# Patient Record
Sex: Female | Born: 1940 | Race: Black or African American | Hispanic: No | Smoking: Never smoker
Health system: Southern US, Community
[De-identification: ages and names within clinical notes are randomized; demographics above are authoritative.]

## PROBLEM LIST (undated history)

## (undated) DIAGNOSIS — E785 Hyperlipidemia, unspecified: Secondary | ICD-10-CM

## (undated) DIAGNOSIS — I10 Essential (primary) hypertension: Secondary | ICD-10-CM

## (undated) DIAGNOSIS — T7840XA Allergy, unspecified, initial encounter: Secondary | ICD-10-CM

## (undated) DIAGNOSIS — G4700 Insomnia, unspecified: Secondary | ICD-10-CM

## (undated) DIAGNOSIS — F0390 Unspecified dementia without behavioral disturbance: Secondary | ICD-10-CM

## (undated) HISTORY — DX: Essential (primary) hypertension: I10

## (undated) HISTORY — DX: Insomnia, unspecified: G47.00

## (undated) HISTORY — DX: Allergy, unspecified, initial encounter: T78.40XA

## (undated) HISTORY — DX: Hyperlipidemia, unspecified: E78.5

---

## 2014-10-22 DIAGNOSIS — G4700 Insomnia, unspecified: Secondary | ICD-10-CM | POA: Diagnosis not present

## 2014-10-22 DIAGNOSIS — I10 Essential (primary) hypertension: Secondary | ICD-10-CM | POA: Diagnosis not present

## 2014-10-22 DIAGNOSIS — I129 Hypertensive chronic kidney disease with stage 1 through stage 4 chronic kidney disease, or unspecified chronic kidney disease: Secondary | ICD-10-CM | POA: Diagnosis not present

## 2014-10-31 DIAGNOSIS — L811 Chloasma: Secondary | ICD-10-CM | POA: Diagnosis not present

## 2015-02-04 DIAGNOSIS — G4700 Insomnia, unspecified: Secondary | ICD-10-CM | POA: Diagnosis not present

## 2015-02-04 DIAGNOSIS — F0390 Unspecified dementia without behavioral disturbance: Secondary | ICD-10-CM | POA: Diagnosis not present

## 2015-04-09 ENCOUNTER — Telehealth: Payer: Self-pay

## 2015-04-09 DIAGNOSIS — G4700 Insomnia, unspecified: Secondary | ICD-10-CM | POA: Insufficient documentation

## 2015-04-09 DIAGNOSIS — I10 Essential (primary) hypertension: Secondary | ICD-10-CM | POA: Insufficient documentation

## 2015-04-09 DIAGNOSIS — E785 Hyperlipidemia, unspecified: Secondary | ICD-10-CM | POA: Insufficient documentation

## 2015-04-09 NOTE — Telephone Encounter (Signed)
I will not rx ambien  If pt willing neurology apt to check for insomnia and explore suitable tx

## 2015-04-09 NOTE — Telephone Encounter (Signed)
Patient does not want Neurology referral.

## 2015-04-09 NOTE — Telephone Encounter (Signed)
Patient states the medication you are giving her is not helping her sleep and she would like to get her Ambien back [facility]

## 2015-05-29 ENCOUNTER — Encounter: Payer: Self-pay | Admitting: Family Medicine

## 2015-05-29 ENCOUNTER — Ambulatory Visit (INDEPENDENT_AMBULATORY_CARE_PROVIDER_SITE_OTHER): Payer: Commercial Managed Care - HMO | Admitting: Family Medicine

## 2015-05-29 VITALS — BP 131/70 | HR 63 | Temp 97.9°F | Ht 64.3 in | Wt 150.6 lb

## 2015-05-29 DIAGNOSIS — G4700 Insomnia, unspecified: Secondary | ICD-10-CM | POA: Diagnosis not present

## 2015-05-29 MED ORDER — MIRTAZAPINE 7.5 MG PO TABS: 7.5000 mg | ORAL_TABLET | Freq: Every day | ORAL | Status: DC

## 2015-05-29 NOTE — Patient Instructions (Signed)
Insomnia Insomnia is frequent trouble falling and/or staying asleep. Insomnia can be a long term problem or a short term problem. Both are common. Insomnia can be a short term problem when the wakefulness is related to a certain stress or worry. Long term insomnia is often related to ongoing stress during waking hours and/or poor sleeping habits. Overtime, sleep deprivation itself can make the problem worse. Every little thing feels more severe because you are overtired and your ability to cope is decreased. CAUSES   Stress, anxiety, and depression.  Poor sleeping habits.  Distractions such as TV in the bedroom.  Naps close to bedtime.  Engaging in emotionally charged conversations before bed.  Technical reading before sleep.  Alcohol and other sedatives. They may make the problem worse. They can hurt normal sleep patterns and normal dream activity.  Stimulants such as caffeine for several hours prior to bedtime.  Pain syndromes and shortness of breath can cause insomnia.  Exercise late at night.  Changing time zones may cause sleeping problems (jet lag). It is sometimes helpful to have someone observe your sleeping patterns. They should look for periods of not breathing during the night (sleep apnea). They should also look to see how long those periods last. If you live alone or observers are uncertain, you can also be observed at a sleep clinic where your sleep patterns will be professionally monitored. Sleep apnea requires a checkup and treatment. Give your caregivers your medical history. Give your caregivers observations your family has made about your sleep.  SYMPTOMS   Not feeling rested in the morning.  Anxiety and restlessness at bedtime.  Difficulty falling and staying asleep. TREATMENT   Your caregiver may prescribe treatment for an underlying medical disorders. Your caregiver can give advice or help if you are using alcohol or other drugs for self-medication. Treatment  of underlying problems will usually eliminate insomnia problems.  Medications can be prescribed for short time use. They are generally not recommended for lengthy use.  Over-the-counter sleep medicines are not recommended for lengthy use. They can be habit forming.  You can promote easier sleeping by making lifestyle changes such as:  Using relaxation techniques that help with breathing and reduce muscle tension.  Exercising earlier in the day.  Changing your diet and the time of your last meal. No night time snacks.  Establish a regular time to go to bed.  Counseling can help with stressful problems and worry.  Soothing music and white noise may be helpful if there are background noises you cannot remove.  Stop tedious detailed work at least one hour before bedtime. HOME CARE INSTRUCTIONS   Keep a diary. Inform your caregiver about your progress. This includes any medication side effects. See your caregiver regularly. Take note of:  Times when you are asleep.  Times when you are awake during the night.  The quality of your sleep.  How you feel the next day. This information will help your caregiver care for you.  Get out of bed if you are still awake after 15 minutes. Read or do some quiet activity. Keep the lights down. Wait until you feel sleepy and go back to bed.  Keep regular sleeping and waking hours. Avoid naps.  Exercise regularly.  Avoid distractions at bedtime. Distractions include watching television or engaging in any intense or detailed activity like attempting to balance the household checkbook.  Develop a bedtime ritual. Keep a familiar routine of bathing, brushing your teeth, climbing into bed at the same   time each night, listening to soothing music. Routines increase the success of falling to sleep faster.  Use relaxation techniques. This can be using breathing and muscle tension release routines. It can also include visualizing peaceful scenes. You can  also help control troubling or intruding thoughts by keeping your mind occupied with boring or repetitive thoughts like the old concept of counting sheep. You can make it more creative like imagining planting one beautiful flower after another in your backyard garden.  During your day, work to eliminate stress. When this is not possible use some of the previous suggestions to help reduce the anxiety that accompanies stressful situations. MAKE SURE YOU:   Understand these instructions.  Will watch your condition.  Will get help right away if you are not doing well or get worse. Document Released: 10/01/2000 Document Revised: 12/27/2011 Document Reviewed: 11/01/2007 Exit[employer] Patient Information 2015 [employer]. This information is not intended to replace advice given to you by your health care provider. Make sure you discuss any questions you have with your health care provider.  

## 2015-05-29 NOTE — Progress Notes (Signed)
BP 131/70 mmHg  Pulse 63  Temp(Src) 97.9 F (36.6 C)  Ht 5' 4.3" (1.633 m)  Wt 150 lb 9.6 oz (68.312 kg)  BMI 25.62 kg/m2  SpO2 98%  LMP 05/20/1994 (Approximate)   Subjective:    Patient ID: Tara Huffman, female    DOB: 03-30-1941, 74 y.o.   MRN: [id number]  HPI: Tara Huffman is a 74 y.o. female  Chief Complaint  Patient presents with  . Insomnia    Patient states that she takes the aricept at times, she states that it causes her to see things. She believes that it is too strong   Reviewed records from [facility]- numerous conversations about adverse effects of ambien. Sleepwalking, tearing things up. Has been having visual hallucinations at night both on and off medications (trazodone). Started on Aricept at night in April. Offered referral to neurology, which she refused in June. Never picked up the remeron from the pharmacy, so never tried it. Has currently just been taking her blood pressure medicine and her aricept, which she takes only occasionally.  INSOMNIA Duration: chronic Satisfied with sleep quality: no Difficulty falling asleep: yes Difficulty staying asleep: yes Waking a few hours after sleep onset: yes Early morning awakenings: yes Daytime hypersomnolence: yes Wakes feeling refreshed: no Good sleep hygiene: no Apnea: no Snoring: no Depressed/anxious mood: no Recent stress: no Restless legs/nocturnal leg cramps: no Chronic pain/arthritis: no History of sleep study: no  Relevant past medical, surgical, family and social history reviewed and updated as indicated. Interim medical history since our last visit reviewed. Allergies and medications reviewed and updated.  Review of Systems  Constitutional: Negative.   Cardiovascular: Negative.   Psychiatric/Behavioral: Negative.     Per HPI unless specifically indicated above     Objective:    BP 131/70 mmHg  Pulse 63  Temp(Src) 97.9 F (36.6 C)  Ht 5' 4.3" (1.633 m)  Wt 150 lb 9.6 oz (68.312 kg)  BMI  25.62 kg/m2  SpO2 98%  LMP 05/20/1994 (Approximate)  Wt Readings from Last 3 Encounters:  05/29/15 150 lb 9.6 oz (68.312 kg)  02/04/15 152 lb (68.947 kg)    Physical Exam  Constitutional: She is oriented to person, place, and time. She appears well-developed and well-nourished. No distress.  HENT:  Head: Normocephalic and atraumatic.  Right Ear: Hearing normal.  Left Ear: Hearing normal.  Nose: Nose normal.  Eyes: Conjunctivae and lids are normal. Right eye exhibits no discharge. Left eye exhibits no discharge. No scleral icterus.  Cardiovascular: Normal rate, regular rhythm and normal heart sounds.  Exam reveals no gallop and no friction rub.   No murmur heard. Pulmonary/Chest: Effort normal and breath sounds normal. No respiratory distress. She has no wheezes. She has no rales. She exhibits no tenderness.  Musculoskeletal: Normal range of motion.  Neurological: She is alert and oriented to person, place, and time.  Skin: Skin is warm, dry and intact. No rash noted. No erythema. No pallor.  Psychiatric: She has a normal mood and affect. Her speech is normal and behavior is normal. Judgment and thought content normal. Cognition and memory are normal.  Nursing note and vitals reviewed.   No results found for this or any previous visit.    Assessment & Plan:   Problem List Items Addressed This Visit      Other   Insomnia - Primary    Informed patient that we cannot give her any more ambien and that it is not a good medicine for her.  She did not like trazodone, so it was discontinued. Never started the remeron. Will start her on the 7.5mg  of remeron, may need to increase. Follow up 1 month. Patient agreed today that if this medicine doesn't seem to work for her, she would go see neurology, but she doesn't want to go today.           Follow up plan: Return in about 4 weeks (around 06/26/2015).

## 2015-05-29 NOTE — Assessment & Plan Note (Signed)
Informed patient that we cannot give her any more ambien and that it is not a good medicine for her. She did not like trazodone, so it was discontinued. Never started the remeron. Will start her on the 7.5mg  of remeron, may need to increase. Follow up 1 month. Patient agreed today that if this medicine doesn't seem to work for her, she would go see neurology, but she doesn't want to go today.

## 2015-06-24 ENCOUNTER — Telehealth: Payer: Self-pay | Admitting: Family Medicine

## 2015-06-24 NOTE — Telephone Encounter (Signed)
PT WAS GIVEN REMERON FOR SLEEP BUT STATES THAT ITS NOT WORKING AND WOULD LIKE TO KNOW IF SHE COULD POSSIBLY GET SOMETHING ELSE.

## 2015-06-24 NOTE — Telephone Encounter (Signed)
Forward to provider

## 2015-06-24 NOTE — Telephone Encounter (Signed)
Will need to wait till MAC gets back

## 2015-06-24 NOTE — Telephone Encounter (Signed)
Patient notified

## 2015-06-25 ENCOUNTER — Other Ambulatory Visit: Payer: Self-pay | Admitting: Family Medicine

## 2015-07-29 ENCOUNTER — Ambulatory Visit (INDEPENDENT_AMBULATORY_CARE_PROVIDER_SITE_OTHER): Payer: Commercial Managed Care - HMO | Admitting: Family Medicine

## 2015-07-29 ENCOUNTER — Encounter: Payer: Self-pay | Admitting: Family Medicine

## 2015-07-29 VITALS — BP 138/60 | HR 65 | Temp 98.8°F | Ht 64.1 in | Wt 152.0 lb

## 2015-07-29 DIAGNOSIS — I10 Essential (primary) hypertension: Secondary | ICD-10-CM

## 2015-07-29 DIAGNOSIS — R413 Other amnesia: Secondary | ICD-10-CM | POA: Diagnosis not present

## 2015-07-29 DIAGNOSIS — G4700 Insomnia, unspecified: Secondary | ICD-10-CM

## 2015-07-29 DIAGNOSIS — R443 Hallucinations, unspecified: Secondary | ICD-10-CM | POA: Diagnosis not present

## 2015-07-29 MED ORDER — QUETIAPINE FUMARATE 25 MG PO TABS: 25.0000 mg | ORAL_TABLET | Freq: Every day | ORAL | Status: DC

## 2015-07-29 NOTE — Assessment & Plan Note (Signed)
Better on recheck. Continue to monitor.  

## 2015-07-29 NOTE — Assessment & Plan Note (Signed)
Possible due to dementia. Referral to neurology made today. Will treat with seroquel to try to decrease hallucinations and help her sleep.

## 2015-07-29 NOTE — Progress Notes (Signed)
BP 138/60 mmHg  Pulse 65  Temp(Src) 98.8 F (37.1 C)  Ht 5' 4.1" (1.628 m)  Wt 152 lb (68.947 kg)  BMI 26.01 kg/m2  SpO2 97%  LMP 05/20/1994 (Approximate)   Subjective:    Patient ID: Tara Huffman, female    DOB: [birth year], 74 y.o.   MRN: [id number]  HPI: Tara Huffman is a 74 y.o. female  Chief Complaint  Patient presents with  . Memory Loss    MMSE- 19.5   Here today with her daughter who notes that she has been very anxious and shaking. She has not been sleeping. The remeron seems to be helping with sleep occasionally. She notes that she has been hallucinating more. She has been hallucinating more and has been calling the police. She thinks that she is seeing people in her house, thought she saw her neighbor in her laundry room and also thought she saw her son beat up in her laundry room. She will sometimes go several days without sleeping- per her and her daughter. She notes that she has been incredbily anxious and has been shaking and difficult to console. She previously did not want to see the neurologist. She doesn't like taking her aricept because she thinks it makes her hallucinate.   INSOMNIA Duration: chronic Satisfied with sleep quality: no Difficulty falling asleep: yes Difficulty staying asleep: yes Waking a few hours after sleep onset: yes Early morning awakenings: yes Daytime hypersomnolence: no Wakes feeling refreshed: no Good sleep hygiene: no Apnea: no Snoring: no Depressed/anxious mood: yes Recent stress: yes Restless legs/nocturnal leg cramps: no Chronic pain/arthritis: no History of sleep study: no Treatments attempted: melatonin, uinsom, benadryl and ambien, trazodone, remeron    Relevant past medical, surgical, family and social history reviewed and updated as indicated. Interim medical history since our last visit reviewed. Allergies and medications reviewed and updated.  Review of Systems  Constitutional: Negative.   Respiratory:  Negative.   Cardiovascular: Negative.   Musculoskeletal: Negative.   Neurological: Negative for dizziness, tremors, seizures, syncope, facial asymmetry, speech difficulty, weakness, light-headedness, numbness and headaches.  Psychiatric/Behavioral: Positive for hallucinations, confusion, sleep disturbance, decreased concentration and agitation. Negative for suicidal ideas, behavioral problems, self-injury and dysphoric mood. The patient is nervous/anxious. The patient is not hyperactive.     Per HPI unless specifically indicated above     Objective:    BP 138/60 mmHg  Pulse 65  Temp(Src) 98.8 F (37.1 C)  Ht 5' 4.1" (1.628 m)  Wt 152 lb (68.947 kg)  BMI 26.01 kg/m2  SpO2 97%  LMP 05/20/1994 (Approximate)  Wt Readings from Last 3 Encounters:  07/29/15 152 lb (68.947 kg)  05/29/15 150 lb 9.6 oz (68.312 kg)  02/04/15 152 lb (68.947 kg)    Physical Exam  Constitutional: She is oriented to person, place, and time. She appears well-developed and well-nourished. No distress.  HENT:  Head: Normocephalic and atraumatic.  Right Ear: Hearing normal.  Left Ear: Hearing normal.  Nose: Nose normal.  Eyes: Conjunctivae and lids are normal. Right eye exhibits no discharge. Left eye exhibits no discharge. No scleral icterus.  Cardiovascular: Normal rate, regular rhythm and normal heart sounds.  Exam reveals no gallop and no friction rub.   No murmur heard. Pulmonary/Chest: Effort normal and breath sounds normal. No respiratory distress. She has no wheezes. She has no rales. She exhibits no tenderness.  Musculoskeletal: Normal range of motion.  Neurological: She is alert and oriented to person, place, and time.  Skin: Skin is  warm, dry and intact. No rash noted. No erythema. No pallor.  Psychiatric: She has a normal mood and affect. Her speech is normal and behavior is normal. Judgment and thought content normal. Cognition and memory are normal.  Nursing note and vitals reviewed.   No  results found for this or any previous visit.    Assessment & Plan:   Problem List Items Addressed This Visit      Cardiovascular and Mediastinum   Hypertension    Better on recheck. Continue to monitor.         Other   Insomnia    Possible due to dementia. Referral to neurology made today. Will treat with seroquel to try to decrease hallucinations and help her sleep.       Memory loss - Primary    Concern for dementia. Willing to see neurology now. Referral generated today. Will check bloodwork as discussed below. Continue to monitor.      Relevant Orders   CBC with Differential/Platelet   Comprehensive metabolic panel   TSH   Vit D  25 hydroxy (rtn osteoporosis monitoring)   B12   Folate   Ambulatory referral to Neurology   Hallucinations    Possible due to dementia. Referral to neurology made today. Will treat with seroquel to try to decrease hallucinations and help her sleep.       Relevant Orders   CBC with Differential/Platelet   Comprehensive metabolic panel   TSH   Vit D  25 hydroxy (rtn osteoporosis monitoring)   B12   Folate   Ambulatory referral to Neurology       Follow up plan: Return following neuro appointment.

## 2015-07-29 NOTE — Assessment & Plan Note (Signed)
Possible due to dementia. Referral to neurology made today. Will treat with seroquel to try to decrease hallucinations and help her sleep.  

## 2015-07-29 NOTE — Assessment & Plan Note (Signed)
Concern for dementia. Willing to see neurology now. Referral generated today. Will check bloodwork as discussed below. Continue to monitor.

## 2015-07-30 ENCOUNTER — Encounter: Payer: Self-pay | Admitting: Family Medicine

## 2015-07-30 LAB — COMPREHENSIVE METABOLIC PANEL
A/G RATIO: 1.5 (ref 1.1–2.5)
ALBUMIN: 4.3 g/dL (ref 3.5–4.8)
ALK PHOS: 62 IU/L (ref 39–117)
ALT: 36 IU/L — ABNORMAL HIGH (ref 0–32)
AST: 61 IU/L — AB (ref 0–40)
BILIRUBIN TOTAL: 1.1 mg/dL (ref 0.0–1.2)
BUN / CREAT RATIO: 15 (ref 11–26)
BUN: 15 mg/dL (ref 8–27)
CHLORIDE: 97 mmol/L (ref 97–108)
CO2: 25 mmol/L (ref 18–29)
Calcium: 10.1 mg/dL (ref 8.7–10.3)
Creatinine, Ser: 0.97 mg/dL (ref 0.57–1.00)
GFR calc Af Amer: 67 mL/min/{1.73_m2} (ref >59)
GFR calc non Af Amer: 58 mL/min/{1.73_m2} — ABNORMAL LOW (ref >59)
GLOBULIN, TOTAL: 2.9 g/dL (ref 1.5–4.5)
Glucose: 97 mg/dL (ref 65–99)
POTASSIUM: 3.7 mmol/L (ref 3.5–5.2)
SODIUM: 143 mmol/L (ref 134–144)
Total Protein: 7.2 g/dL (ref 6.0–8.5)

## 2015-07-30 LAB — VITAMIN D 25 HYDROXY (VIT D DEFICIENCY, FRACTURES): VIT D 25 HYDROXY: 38.8 ng/mL (ref 30.0–100.0)

## 2015-07-30 LAB — CBC WITH DIFFERENTIAL/PLATELET
Basophils Absolute: 0 10*3/uL (ref 0.0–0.2)
Basos: 0 %
EOS (ABSOLUTE): 0.1 10*3/uL (ref 0.0–0.4)
EOS: 1 %
HEMATOCRIT: 39 % (ref 34.0–46.6)
HEMOGLOBIN: 13.3 g/dL (ref 11.1–15.9)
Immature Grans (Abs): 0 10*3/uL (ref 0.0–0.1)
Immature Granulocytes: 0 %
LYMPHS ABS: 2.7 10*3/uL (ref 0.7–3.1)
Lymphs: 33 %
MCH: 31.4 pg (ref 26.6–33.0)
MCHC: 34.1 g/dL (ref 31.5–35.7)
MCV: 92 fL (ref 79–97)
Monocytes Absolute: 0.8 10*3/uL (ref 0.1–0.9)
Monocytes: 9 %
Neutrophils Absolute: 4.5 10*3/uL (ref 1.4–7.0)
Neutrophils: 57 %
Platelets: 302 10*3/uL (ref 150–379)
RBC: 4.23 x10E6/uL (ref 3.77–5.28)
RDW: 13.3 % (ref 12.3–15.4)
WBC: 8 10*3/uL (ref 3.4–10.8)

## 2015-07-30 LAB — VITAMIN B12: VITAMIN B 12: 673 pg/mL (ref 211–946)

## 2015-07-30 LAB — TSH: TSH: 0.582 u[IU]/mL (ref 0.450–4.500)

## 2015-07-30 LAB — FOLATE

## 2015-08-15 ENCOUNTER — Other Ambulatory Visit: Payer: Self-pay | Admitting: Family Medicine

## 2015-09-02 DIAGNOSIS — F0390 Unspecified dementia without behavioral disturbance: Secondary | ICD-10-CM | POA: Diagnosis not present

## 2015-09-12 ENCOUNTER — Other Ambulatory Visit: Payer: Self-pay | Admitting: Family Medicine

## 2015-09-16 ENCOUNTER — Ambulatory Visit: Payer: Commercial Managed Care - HMO | Admitting: Family Medicine

## 2015-09-17 ENCOUNTER — Other Ambulatory Visit: Payer: Self-pay | Admitting: Family Medicine

## 2015-09-18 ENCOUNTER — Other Ambulatory Visit: Payer: Self-pay | Admitting: Family Medicine

## 2015-10-07 ENCOUNTER — Ambulatory Visit: Payer: Commercial Managed Care - HMO | Admitting: Family Medicine

## 2015-10-22 ENCOUNTER — Ambulatory Visit: Payer: Commercial Managed Care - HMO | Admitting: Family Medicine

## 2015-11-13 ENCOUNTER — Telehealth: Payer: Self-pay

## 2015-11-13 NOTE — Telephone Encounter (Signed)
Pharmacist would like to confirm that the patients RX for Quetiapine (Seroquel) should be 25 MG and not 50 MG.  Patient is telling the pharmacist that the doctor changed the dosage to 50 MG.  Please call pharmacist to advise.

## 2015-11-13 NOTE — Telephone Encounter (Signed)
Dr.Johnson, I checked the last office visit and did not see where you increased the seroquel, can you please verify this.

## 2015-11-13 NOTE — Telephone Encounter (Signed)
Spoke with pharmacy they will contact the specialist.

## 2015-11-13 NOTE — Telephone Encounter (Signed)
Looks like her neurologist Dr. Malvin Johns did that. It's OK for them to fill it, but refills should go to him. Thanks!

## 2015-12-06 ENCOUNTER — Emergency Department
Admission: EM | Admit: 2015-12-06 | Discharge: 2015-12-06 | Disposition: A | Discharge status: Home / Self Care | Payer: Commercial Managed Care - HMO | Attending: Emergency Medicine | Admitting: Emergency Medicine

## 2015-12-06 ENCOUNTER — Emergency Department: Payer: Commercial Managed Care - HMO

## 2015-12-06 ENCOUNTER — Encounter: Payer: Self-pay | Admitting: Emergency Medicine

## 2015-12-06 DIAGNOSIS — J189 Pneumonia, unspecified organism: Secondary | ICD-10-CM | POA: Diagnosis not present

## 2015-12-06 DIAGNOSIS — J3489 Other specified disorders of nose and nasal sinuses: Secondary | ICD-10-CM | POA: Diagnosis not present

## 2015-12-06 DIAGNOSIS — I10 Essential (primary) hypertension: Secondary | ICD-10-CM | POA: Insufficient documentation

## 2015-12-06 DIAGNOSIS — R509 Fever, unspecified: Secondary | ICD-10-CM

## 2015-12-06 DIAGNOSIS — R410 Disorientation, unspecified: Secondary | ICD-10-CM | POA: Diagnosis not present

## 2015-12-06 DIAGNOSIS — R0981 Nasal congestion: Secondary | ICD-10-CM | POA: Insufficient documentation

## 2015-12-06 DIAGNOSIS — Z79899 Other long term (current) drug therapy: Secondary | ICD-10-CM | POA: Insufficient documentation

## 2015-12-06 DIAGNOSIS — R05 Cough: Secondary | ICD-10-CM | POA: Diagnosis not present

## 2015-12-06 DIAGNOSIS — N3090 Cystitis, unspecified without hematuria: Secondary | ICD-10-CM

## 2015-12-06 DIAGNOSIS — E860 Dehydration: Secondary | ICD-10-CM | POA: Diagnosis not present

## 2015-12-06 LAB — CBC
HEMATOCRIT: 40.2 % (ref 35.0–47.0)
Hemoglobin: 13.6 g/dL (ref 12.0–16.0)
MCH: 31.2 pg (ref 26.0–34.0)
MCHC: 33.8 g/dL (ref 32.0–36.0)
MCV: 92.2 fL (ref 80.0–100.0)
Platelets: 216 10*3/uL (ref 150–440)
RBC: 4.37 MIL/uL (ref 3.80–5.20)
RDW: 13.4 % (ref 11.5–14.5)
WBC: 5.8 10*3/uL (ref 3.6–11.0)

## 2015-12-06 LAB — URINALYSIS COMPLETE WITH MICROSCOPIC (ARMC ONLY)
BACTERIA UA: NONE SEEN
Bilirubin Urine: NEGATIVE
GLUCOSE, UA: NEGATIVE mg/dL
Ketones, ur: NEGATIVE mg/dL
Nitrite: NEGATIVE
PROTEIN: NEGATIVE mg/dL
Specific Gravity, Urine: 1.016 (ref 1.005–1.030)
pH: 5 (ref 5.0–8.0)

## 2015-12-06 LAB — COMPREHENSIVE METABOLIC PANEL
ALBUMIN: 4.1 g/dL (ref 3.5–5.0)
ALK PHOS: 58 U/L (ref 38–126)
ALT: 39 U/L (ref 14–54)
AST: 62 U/L — AB (ref 15–41)
Anion gap: 7 (ref 5–15)
BUN: 9 mg/dL (ref 6–20)
CALCIUM: 8.9 mg/dL (ref 8.9–10.3)
CHLORIDE: 102 mmol/L (ref 101–111)
CO2: 30 mmol/L (ref 22–32)
CREATININE: 0.87 mg/dL (ref 0.44–1.00)
GFR calc non Af Amer: >60 mL/min (ref >60)
GLUCOSE: 102 mg/dL — AB (ref 65–99)
Potassium: 3.1 mmol/L — ABNORMAL LOW (ref 3.5–5.1)
SODIUM: 139 mmol/L (ref 135–145)
Total Bilirubin: 0.4 mg/dL (ref 0.3–1.2)
Total Protein: 7.3 g/dL (ref 6.5–8.1)

## 2015-12-06 MED ORDER — POTASSIUM CHLORIDE CRYS ER 20 MEQ PO TBCR
40.0000 meq | EXTENDED_RELEASE_TABLET | Freq: Once | ORAL | Status: AC
  Administered 2015-12-06: 40 meq via ORAL
  Filled 2015-12-06: qty 2

## 2015-12-06 MED ORDER — CEPHALEXIN 500 MG PO CAPS: 500.0000 mg | ORAL_CAPSULE | Freq: Two times a day (BID) | ORAL | Status: DC

## 2015-12-06 MED ORDER — ACETAMINOPHEN 325 MG PO TABS
650.0000 mg | ORAL_TABLET | Freq: Once | ORAL | Status: AC
  Administered 2015-12-06: 650 mg via ORAL
  Filled 2015-12-06: qty 2

## 2015-12-06 NOTE — ED Notes (Signed)
Cough x 4 days, fevers.

## 2015-12-06 NOTE — ED Provider Notes (Signed)
[facility] Emergency Department Provider Note  ____________________________________________  Time seen: 4:00 PM  I have reviewed the triage vital signs and the nursing notes.   HISTORY  Chief Complaint Cough  history obtained from patient and family member at bedside HPI Tara Huffman is a 75 y.o. female sent to the ED from urgent care for suspected pneumonia. The patient has had decreased energy over the past 2 days and this morning was not able to get out of bed. She had urinated in bed 3 times when family got there to check on her. She's had cough for the past 4 days as well, nonproductive. She also has some rhinorrhea and nasal congestion but has been eating and drinking normally. Fever to 102 at home.  Per family, patient had a flu test at urgent care today which was negative.     Past Medical History  Diagnosis Date  . Hyperlipidemia   . Hypertension   . Allergy   . Insomnia   . Insomnia      Patient Active Problem List   Diagnosis Date Noted  . Memory loss 07/29/2015  . Hallucinations 07/29/2015  . Hyperlipidemia   . Hypertension   . Insomnia      History reviewed. No pertinent past surgical history.   Current Outpatient Rx  Name  Route  Sig  Dispense  Refill  . atenolol (TENORMIN) 50 MG tablet      TAKE 1 TABLET BY MOUTH EVERY DAY   30 tablet   6   . cephALEXin (KEFLEX) 500 MG capsule   Oral   Take 1 capsule (500 mg total) by mouth 2 (two) times daily.   14 capsule   0   . clobetasol (TEMOVATE) 0.05 % GEL   Topical   Apply topically 2 (two) times daily as needed.         . donepezil (ARICEPT) 5 MG tablet   Oral   Take 5 mg by mouth at bedtime.         . hydrochlorothiazide (HYDRODIURIL) 25 MG tablet      TAKE 1 TABLET BY MOUTH EVERY DAY   30 tablet   6   . mirtazapine (REMERON) 7.5 MG tablet      TAKE 1 TABLET (7.5 MG TOTAL) BY MOUTH AT BEDTIME.   30 tablet   6   . QUEtiapine (SEROQUEL) 25 MG  tablet      TAKE 1 TABLET (25 MG TOTAL) BY MOUTH AT BEDTIME.   30 tablet   6      Allergies Ambien   Family History  Problem Relation Age of Onset  . Thyroid disease Sister     Social History Social History  Substance Use Topics  . Smoking status: Never Smoker   . Smokeless tobacco: Never Used  . Alcohol Use: No    Review of Systems  Constitutional:   Positive fever. No weight changes Eyes:   No blurry vision or double vision.  ENT:   No sore throat. Positive rhinorrhea Cardiovascular:   No chest pain. Respiratory:   No dyspnea positive nonproductive cough. Gastrointestinal:   Negative for abdominal pain, vomiting and diarrhea.  No BRBPR or melena. Genitourinary:   Negative for dysuria or difficulty urinating. Musculoskeletal:   Negative for back pain. No joint swelling or pain. Skin:   Negative for rash. Neurological:   Negative for headaches, focal weakness or numbness. Psychiatric:  No anxiety or depression.   Endocrine:  No changes in energy  or sleep difficulty.  10-point ROS otherwise negative.  ____________________________________________   PHYSICAL EXAM:  VITAL SIGNS: ED Triage Vitals  Enc Vitals Group     BP 12/06/15 1533 126/62 mmHg     Pulse Rate 12/06/15 1533 72     Resp 12/06/15 1533 20     Temp 12/06/15 1533 101.7 F (38.7 C)     Temp Source 12/06/15 1533 Oral     SpO2 12/06/15 1533 97 %     Weight 12/06/15 1533 171 lb (77.565 kg)     Height 12/06/15 1533 5\' 7"  (1.702 m)     Head Cir --      Peak Flow --      Pain Score --      Pain Loc --      Pain Edu? --      Excl. in GC? --     Vital signs reviewed, nursing assessments reviewed.   Constitutional:   Alert and oriented to person and place. Well appearing and in no distress. Eyes:   No scleral icterus. No conjunctival pallor. PERRL. EOMI ENT   Head:   Normocephalic and atraumatic.   Nose:   Positive nasal congestion. No septal hematoma.   Mouth/Throat:   MMM, no  pharyngeal erythema. No peritonsillar mass.    Neck:   No stridor. No SubQ emphysema. No meningismus. Hematological/Lymphatic/Immunilogical:   No cervical lymphadenopathy. Cardiovascular:   RRR. Symmetric bilateral radial and DP pulses.  No murmurs.  Respiratory:   Normal respiratory effort without tachypnea nor retractions. Breath sounds are clear and equal bilaterally. No wheezes/rales/rhonchi. Gastrointestinal:   Soft and nontender. Non distended. There is no CVA tenderness.  No rebound, rigidity, or guarding. Genitourinary:   deferred Musculoskeletal:   Nontender with normal range of motion in all extremities. No joint effusions.  No lower extremity tenderness.  No edema. Neurologic:   Normal speech and language.  CN 2-10 normal. Motor grossly intact. No gross focal neurologic deficits are appreciated.  Skin:    Skin is warm, dry and intact. No rash noted.  No petechiae, purpura, or bullae. Psychiatric:   Mood and affect are normal. No SI or hallucinations. Baseline mental status with chronic dementia ____________________________________________    LABS (pertinent positives/negatives) (all labs ordered are listed, but only abnormal results are displayed) Labs Reviewed  COMPREHENSIVE METABOLIC PANEL - Abnormal; Notable for the following:    Potassium 3.1 (*)    Glucose, Bld 102 (*)    AST 62 (*)    All other components within normal limits  URINALYSIS COMPLETEWITH MICROSCOPIC ([facility] ONLY) - Abnormal; Notable for the following:    Color, Urine YELLOW (*)    APPearance HAZY (*)    Hgb urine dipstick 2+ (*)    Leukocytes, UA 2+ (*)    Squamous Epithelial / LPF 6-30 (*)    All other components within normal limits  URINE CULTURE  CBC   ____________________________________________   EKG    ____________________________________________    RADIOLOGY  Chest x-ray  unremarkable  ____________________________________________   PROCEDURES   ____________________________________________   INITIAL IMPRESSION / ASSESSMENT AND PLAN / ED COURSE  Pertinent labs & imaging results that were available during my care of the patient were reviewed by me and considered in my medical decision making (see chart for details).  Patient presents with fever to 101.7. This resolved down to 98 with Tylenol. On exam she looks very good and there are no specific focal findings other than some evidence of a  viral upper respiratory infection. Urinalysis does look like she has a urinary tract infection. We'll start her on Keflex for this and send a culture. Have her follow up closely with primary care. She is otherwise well-appearing no acute distress nontoxic tolerating oral intake. Family is comfortable taking her home.     ____________________________________________   FINAL CLINICAL IMPRESSION(S) / ED DIAGNOSES  Final diagnoses:  Fever, unspecified fever cause  Cystitis      Sharman Cheek, MD 12/06/15 1736

## 2015-12-06 NOTE — ED Notes (Signed)
Offered pt food or drink. Does not want anything at this time.

## 2015-12-06 NOTE — Discharge Instructions (Signed)
Fever, Adult °A fever is an increase in the body's temperature. It is usually defined as a temperature of 100°F (38°C) or higher. Brief mild or moderate fevers generally have no long-term effects, and they often do not require treatment. Moderate or high fevers may make you feel uncomfortable and can sometimes be a sign of a serious illness or disease. The sweating that may occur with repeated or prolonged fever may also cause dehydration. °Fever is confirmed by taking a temperature with a thermometer. A measured temperature can vary with: °· Age. °· Time of day. °· Location of the thermometer: °· Mouth (oral). °· Rectum (rectal). °· Ear (tympanic). °· Underarm (axillary). °· Forehead (temporal). °HOME CARE INSTRUCTIONS °Pay attention to any changes in your symptoms. Take these actions to help with your condition: °· Take over-the counter and prescription medicines only as told by your health care provider. Follow the dosing instructions carefully. °· If you were prescribed an antibiotic medicine, take it as told by your health care provider. Do not stop taking the antibiotic even if you start to feel better. °· Rest as needed. °· Drink enough fluid to keep your urine clear or pale yellow. This helps to prevent dehydration. °· Sponge yourself or bathe with room-temperature water to help reduce your body temperature as needed. Do not use ice water. °· Do not overbundle yourself in blankets or heavy clothes. °SEEK MEDICAL CARE IF: °· You vomit. °· You cannot eat or drink without vomiting. °· You have diarrhea. °· You have pain when you urinate. °· Your symptoms do not improve with treatment. °· You develop new symptoms. °· You develop excessive weakness. °SEEK IMMEDIATE MEDICAL CARE IF: °· You have shortness of breath or have trouble breathing. °· You are dizzy or you faint. °· You are disoriented or confused. °· You develop signs of dehydration, such as a dry mouth, decreased urination, or paleness. °· You develop  severe pain in your abdomen. °· You have persistent vomiting or diarrhea. °· You develop a skin rash. °· Your symptoms suddenly get worse. °  °This information is not intended to replace advice given to you by your health care provider. Make sure you discuss any questions you have with your health care provider. °  °Document Released: 03/30/2001 Document Revised: 06/25/2015 Document Reviewed: 11/28/2014 °Elsevier Interactive Patient Education ©2016 [employer] ° °Urinary Tract Infection °Urinary tract infections (UTIs) can develop anywhere along your urinary tract. Your urinary tract is your body's drainage system for removing wastes and extra water. Your urinary tract includes two kidneys, two ureters, a bladder, and a urethra. Your kidneys are a pair of bean-shaped organs. Each kidney is about the size of your fist. They are located below your ribs, one on each side of your spine. °CAUSES °Infections are caused by microbes, which are microscopic organisms, including fungi, viruses, and bacteria. These organisms are so small that they can only be seen through a microscope. Bacteria are the microbes that most commonly cause UTIs. °SYMPTOMS  °Symptoms of UTIs may vary by age and gender of the patient and by the location of the infection. Symptoms in young women typically include a frequent and intense urge to urinate and a painful, burning feeling in the bladder or urethra during urination. Older women and men are more likely to be tired, shaky, and weak and have muscle aches and abdominal pain. A fever may mean the infection is in your kidneys. Other symptoms of a kidney infection include pain in your back or   sides below the ribs, nausea, and vomiting. °DIAGNOSIS °To diagnose a UTI, your caregiver will ask you about your symptoms. Your caregiver will also ask you to provide a urine sample. The urine sample will be tested for bacteria and white blood cells. White blood cells are made by your body to help fight  infection. °TREATMENT  °Typically, UTIs can be treated with medication. Because most UTIs are caused by a bacterial infection, they usually can be treated with the use of antibiotics. The choice of antibiotic and length of treatment depend on your symptoms and the type of bacteria causing your infection. °HOME CARE INSTRUCTIONS °· If you were prescribed antibiotics, take them exactly as your caregiver instructs you. Finish the medication even if you feel better after you have only taken some of the medication. °· Drink enough water and fluids to keep your urine clear or pale yellow. °· Avoid caffeine, tea, and carbonated beverages. They tend to irritate your bladder. °· Empty your bladder often. Avoid holding urine for long periods of time. °· Empty your bladder before and after sexual intercourse. °· After a bowel movement, women should cleanse from front to back. Use each tissue only once. °SEEK MEDICAL CARE IF:  °· You have back pain. °· You develop a fever. °· Your symptoms do not begin to resolve within 3 days. °SEEK IMMEDIATE MEDICAL CARE IF:  °· You have severe back pain or lower abdominal pain. °· You develop chills. °· You have nausea or vomiting. °· You have continued burning or discomfort with urination. °MAKE SURE YOU:  °· Understand these instructions. °· Will watch your condition. °· Will get help right away if you are not doing well or get worse. °  °This information is not intended to replace advice given to you by your health care provider. Make sure you discuss any questions you have with your health care provider. °  °Document Released: 07/14/2005 Document Revised: 06/25/2015 Document Reviewed: 11/12/2011 °Elsevier Interactive Patient Education ©2016 [employer] Inc. ° °

## 2015-12-06 NOTE — ED Notes (Signed)
Patient transported to X-ray 

## 2015-12-06 NOTE — ED Notes (Signed)
Pt seen at urgent care and sent to ED for dehydration and PNA.  Febrile in triage here.

## 2015-12-08 LAB — URINE CULTURE

## 2016-03-11 ENCOUNTER — Ambulatory Visit (INDEPENDENT_AMBULATORY_CARE_PROVIDER_SITE_OTHER): Payer: Commercial Managed Care - HMO | Admitting: Family Medicine

## 2016-03-11 ENCOUNTER — Telehealth: Payer: Self-pay | Admitting: Family Medicine

## 2016-03-11 ENCOUNTER — Encounter: Payer: Self-pay | Admitting: Family Medicine

## 2016-03-11 VITALS — BP 117/67 | HR 58 | Temp 98.3°F | Ht 65.1 in | Wt 149.0 lb

## 2016-03-11 DIAGNOSIS — R413 Other amnesia: Secondary | ICD-10-CM | POA: Diagnosis not present

## 2016-03-11 DIAGNOSIS — I10 Essential (primary) hypertension: Secondary | ICD-10-CM

## 2016-03-11 DIAGNOSIS — R443 Hallucinations, unspecified: Secondary | ICD-10-CM

## 2016-03-11 LAB — MICROSCOPIC EXAMINATION

## 2016-03-11 LAB — URINALYSIS, ROUTINE W REFLEX MICROSCOPIC
Bilirubin, UA: NEGATIVE
GLUCOSE, UA: NEGATIVE
NITRITE UA: NEGATIVE
PH UA: 5 (ref 5.0–7.5)
Protein, UA: NEGATIVE
Specific Gravity, UA: 1.02 (ref 1.005–1.030)
UUROB: 0.2 mg/dL (ref 0.2–1.0)

## 2016-03-11 MED ORDER — QUETIAPINE FUMARATE 100 MG PO TABS: 100.0000 mg | ORAL_TABLET | Freq: Every day | ORAL | Status: DC

## 2016-03-11 MED ORDER — ATENOLOL 50 MG PO TABS: 50.0000 mg | ORAL_TABLET | Freq: Every day | ORAL | Status: DC

## 2016-03-11 NOTE — Telephone Encounter (Signed)
Phone call Discussed with patient's daughter who is able to provide more history has tried 50 mg Seroquel which doesn't help somehow got back on 25 we'll go ahead and increase Seroquel 200 mg by going to 50 for several days then 100

## 2016-03-11 NOTE — Assessment & Plan Note (Signed)
With low potassium will stop hydrochlorothiazide for now check BMP

## 2016-03-11 NOTE — Progress Notes (Signed)
BP 117/67 mmHg  Pulse 58  Temp(Src) 98.3 F (36.8 C)  Ht 5' 5.1" (1.654 m)  Wt 149 lb (67.586 kg)  BMI 24.71 kg/m2  SpO2 95%  LMP 05/20/1994 (Approximate)   Subjective:    Patient ID: Tara Huffman, female    DOB: 01-31-1941, 75 y.o.   MRN: [id number]  HPI: Tara Huffman is a 75 y.o. female  Chief Complaint  Patient presents with  . Hallucinations  Patient accompanied by her son who assists with history patient has long-term history of hallucinations with dementia is been followed by Dr. Malvin Johns whose notes were reviewed patient hasn't been back for follow-up he started Seroquel 25 mg to help with sleep which does maybe not helping with hallucinations. On chart review patient's potassium was low still taking hydrochlorothiazide blood pressure doing well. Caregiver has a note from his sister and clonazepam has helped is interested in doing that on chart review patient had a very tough time getting off Ambien  Patient's blood pressure also otherwise doing well   Relevant past medical, surgical, family and social history reviewed and updated as indicated. Interim medical history since our last visit reviewed. Allergies and medications reviewed and updated.  Review of Systems  Constitutional: Negative.   Respiratory: Negative.   Cardiovascular: Negative.     Per HPI unless specifically indicated above     Objective:    BP 117/67 mmHg  Pulse 58  Temp(Src) 98.3 F (36.8 C)  Ht 5' 5.1" (1.654 m)  Wt 149 lb (67.586 kg)  BMI 24.71 kg/m2  SpO2 95%  LMP 05/20/1994 (Approximate)  Wt Readings from Last 3 Encounters:  03/11/16 149 lb (67.586 kg)  12/06/15 171 lb (77.565 kg)  07/29/15 152 lb (68.947 kg)    Physical Exam  Constitutional: She is oriented to person, place, and time. She appears well-developed and well-nourished. No distress.  HENT:  Head: Normocephalic and atraumatic.  Right Ear: Hearing normal.  Left Ear: Hearing normal.  Nose: Nose normal.  Eyes:  Conjunctivae and lids are normal. Right eye exhibits no discharge. Left eye exhibits no discharge. No scleral icterus.  Cardiovascular: Normal rate, regular rhythm and normal heart sounds.   Pulmonary/Chest: Effort normal and breath sounds normal. No respiratory distress.  Musculoskeletal: Normal range of motion.  Neurological: She is alert and oriented to person, place, and time.  Skin: Skin is intact. No rash noted.  Psychiatric: She has a normal mood and affect. Her speech is normal. Cognition and memory are normal.    Results for orders placed or performed during the hospital encounter of 12/06/15  Urine culture  Result Value Ref Range   Specimen Description URINE, RANDOM    Special Requests NONE    Culture MULTIPLE SPECIES PRESENT, SUGGEST RECOLLECTION    Report Status 12/08/2015 FINAL   CBC  Result Value Ref Range   WBC 5.8 3.6 - 11.0 K/uL   RBC 4.37 3.80 - 5.20 MIL/uL   Hemoglobin 13.6 12.0 - 16.0 g/dL   HCT 29.5 74.7 - 34.0 %   MCV 92.2 80.0 - 100.0 fL   MCH 31.2 26.0 - 34.0 pg   MCHC 33.8 32.0 - 36.0 g/dL   RDW 37.0 96.4 - 38.3 %   Platelets 216 150 - 440 K/uL  Comprehensive metabolic panel  Result Value Ref Range   Sodium 139 135 - 145 mmol/L   Potassium 3.1 (L) 3.5 - 5.1 mmol/L   Chloride 102 101 - 111 mmol/L   CO2 30  22 - 32 mmol/L   Glucose, Bld 102 (H) 65 - 99 mg/dL   BUN 9 6 - 20 mg/dL   Creatinine, Ser 2.02 0.44 - 1.00 mg/dL   Calcium 8.9 8.9 - 54.2 mg/dL   Total Protein 7.3 6.5 - 8.1 g/dL   Albumin 4.1 3.5 - 5.0 g/dL   AST 62 (H) 15 - 41 U/L   ALT 39 14 - 54 U/L   Alkaline Phosphatase 58 38 - 126 U/L   Total Bilirubin 0.4 0.3 - 1.2 mg/dL   GFR calc non Af Amer >60 >60 mL/min   GFR calc Af Amer >60 >60 mL/min   Anion gap 7 5 - 15  Urinalysis complete, with microscopic  Result Value Ref Range   Color, Urine YELLOW (A) YELLOW   APPearance HAZY (A) CLEAR   Glucose, UA NEGATIVE NEGATIVE mg/dL   Bilirubin Urine NEGATIVE NEGATIVE   Ketones, ur NEGATIVE  NEGATIVE mg/dL   Specific Gravity, Urine 1.016 1.005 - 1.030   Hgb urine dipstick 2+ (A) NEGATIVE   pH 5.0 5.0 - 8.0   Protein, ur NEGATIVE NEGATIVE mg/dL   Nitrite NEGATIVE NEGATIVE   Leukocytes, UA 2+ (A) NEGATIVE   RBC / HPF 6-30 0 - 5 RBC/hpf   WBC, UA 6-30 0 - 5 WBC/hpf   Bacteria, UA NONE SEEN NONE SEEN   Squamous Epithelial / LPF 6-30 (A) NONE SEEN   Mucous PRESENT       Assessment & Plan:   Problem List Items Addressed This Visit      Cardiovascular and Mediastinum   Hypertension - Primary    With low potassium will stop hydrochlorothiazide for now check BMP       Relevant Medications   atenolol (TENORMIN) 50 MG tablet   Other Relevant Orders   Basic metabolic panel   Urinalysis, Routine w reflex microscopic (not at [facility])     Other   Memory loss    Reviewed Dr. Daisy Blossom notes will refer back to Dr. Malvin Johns for further evaluation especially with hallucinations also check urinalysis which was normal no evidence of UTI aggravating patient's memory loss      Hallucinations    Discuss Will stop Remeron and hydrochlorothiazide observe response also to patient's sleep may need to increase Seroquel but will discuss with Dr. Malvin Johns.          Follow up plan: Return in about 2 months (around 05/11/2016) for Follow-up medicine and pressure.

## 2016-03-11 NOTE — Assessment & Plan Note (Signed)
Reviewed Dr. Daisy Blossom notes will refer back to Dr. Malvin Johns for further evaluation especially with hallucinations also check urinalysis which was normal no evidence of UTI aggravating patient's memory loss

## 2016-03-11 NOTE — Telephone Encounter (Signed)
Pts daughter called and would like to speak to MAC about things that are concerning her about the pt.

## 2016-03-11 NOTE — Assessment & Plan Note (Signed)
Discuss Will stop Remeron and hydrochlorothiazide observe response also to patient's sleep may need to increase Seroquel but will discuss with Dr. Malvin Johns.

## 2016-03-12 LAB — BASIC METABOLIC PANEL
BUN / CREAT RATIO: 13 (ref 12–28)
BUN: 12 mg/dL (ref 8–27)
CO2: 24 mmol/L (ref 18–29)
CREATININE: 0.89 mg/dL (ref 0.57–1.00)
Calcium: 10 mg/dL (ref 8.7–10.3)
Chloride: 95 mmol/L — ABNORMAL LOW (ref 96–106)
GFR calc Af Amer: 74 mL/min/{1.73_m2} (ref >59)
GFR, EST NON AFRICAN AMERICAN: 64 mL/min/{1.73_m2} (ref >59)
Glucose: 106 mg/dL — ABNORMAL HIGH (ref 65–99)
Potassium: 3.3 mmol/L — ABNORMAL LOW (ref 3.5–5.2)
SODIUM: 139 mmol/L (ref 134–144)

## 2016-03-16 ENCOUNTER — Telehealth: Payer: Self-pay | Admitting: Family Medicine

## 2016-03-16 NOTE — Telephone Encounter (Signed)
Phone call Discussed with patients daughter  low potassium will do dietary potassium supplementation recheck BMP couple months.

## 2016-04-08 DIAGNOSIS — F0390 Unspecified dementia without behavioral disturbance: Secondary | ICD-10-CM | POA: Diagnosis not present

## 2016-04-08 DIAGNOSIS — I10 Essential (primary) hypertension: Secondary | ICD-10-CM | POA: Diagnosis not present

## 2016-04-19 ENCOUNTER — Ambulatory Visit: Payer: Commercial Managed Care - HMO | Admitting: Family Medicine

## 2016-05-04 ENCOUNTER — Other Ambulatory Visit: Payer: Self-pay | Admitting: Family Medicine

## 2016-06-22 ENCOUNTER — Other Ambulatory Visit: Payer: Self-pay | Admitting: Family Medicine

## 2016-07-02 ENCOUNTER — Ambulatory Visit: Payer: Commercial Managed Care - HMO | Admitting: Family Medicine

## 2016-07-15 ENCOUNTER — Ambulatory Visit: Payer: Commercial Managed Care - HMO | Admitting: Family Medicine

## 2016-07-27 ENCOUNTER — Encounter: Payer: Self-pay | Admitting: Family Medicine

## 2016-07-27 ENCOUNTER — Ambulatory Visit (INDEPENDENT_AMBULATORY_CARE_PROVIDER_SITE_OTHER): Payer: Commercial Managed Care - HMO | Admitting: Family Medicine

## 2016-07-27 VITALS — BP 136/65 | HR 54 | Temp 99.1°F | Wt 144.0 lb

## 2016-07-27 DIAGNOSIS — F03C0 Unspecified dementia, severe, without behavioral disturbance, psychotic disturbance, mood disturbance, and anxiety: Secondary | ICD-10-CM | POA: Insufficient documentation

## 2016-07-27 DIAGNOSIS — R8281 Pyuria: Secondary | ICD-10-CM

## 2016-07-27 DIAGNOSIS — E782 Mixed hyperlipidemia: Secondary | ICD-10-CM | POA: Diagnosis not present

## 2016-07-27 DIAGNOSIS — F03A0 Unspecified dementia, mild, without behavioral disturbance, psychotic disturbance, mood disturbance, and anxiety: Secondary | ICD-10-CM

## 2016-07-27 DIAGNOSIS — E876 Hypokalemia: Secondary | ICD-10-CM | POA: Diagnosis not present

## 2016-07-27 DIAGNOSIS — G4701 Insomnia due to medical condition: Secondary | ICD-10-CM

## 2016-07-27 DIAGNOSIS — I10 Essential (primary) hypertension: Secondary | ICD-10-CM | POA: Diagnosis not present

## 2016-07-27 DIAGNOSIS — R443 Hallucinations, unspecified: Secondary | ICD-10-CM | POA: Diagnosis not present

## 2016-07-27 DIAGNOSIS — F0390 Unspecified dementia without behavioral disturbance: Secondary | ICD-10-CM | POA: Diagnosis not present

## 2016-07-27 DIAGNOSIS — N390 Urinary tract infection, site not specified: Secondary | ICD-10-CM

## 2016-07-27 MED ORDER — ATENOLOL 50 MG PO TABS: 50.0000 mg | ORAL_TABLET | Freq: Every day | ORAL | 6 refills | Status: DC

## 2016-07-27 MED ORDER — QUETIAPINE FUMARATE 200 MG PO TABS: 200.0000 mg | ORAL_TABLET | Freq: Every day | ORAL | 3 refills | Status: DC

## 2016-07-27 NOTE — Progress Notes (Signed)
BP 136/65   Pulse (!) 54   Temp 99.1 F (37.3 C)   Wt 144 lb (65.3 kg)   LMP 05/20/1994 (Approximate) Comment: age 75  SpO2 99%   BMI 23.89 kg/m    Subjective:    Patient ID: Tara Huffman, female    DOB: [birth year], 75 y.o.   MRN: [id number]  HPI: Tara Huffman is a 75 y.o. female  Chief Complaint  Patient presents with  . Insomnia   Patient is here alone today. Poor historian.  HYPERTENSION / HYPERLIPIDEMIA Satisfied with current treatment? yes Duration of hypertension: chronic BP monitoring frequency: not checking BP medication side effects: no Past BP meds: HCTZ,  Duration of hyperlipidemia: chronic Cholesterol medication side effects: Not taking anything Medication compliance: Unclear compliance Aspirin: no Recent stressors: no Recurrent headaches: no Visual changes: no Palpitations: no Dyspnea: no Chest pain: no Lower extremity edema: no Dizzy/lightheaded: no  Saw Dr. Malvin Johns in June. MMSE was worse with mild dementia. Continuing aricept. Referral recommended at that time to geri-psych for management of hallucinations, but this was declined. Seroquel was increased to 125mg  qHS, OK to continue tylenol PM. Due to see him again in December- appt scheduled for 10/07/16.   INSOMNIA Duration: chronic Satisfied with sleep quality: no Difficulty falling asleep: yes Difficulty staying asleep: no Waking a few hours after sleep onset: no Early morning awakenings: yes Daytime hypersomnolence: no Wakes feeling refreshed: yes Good sleep hygiene: no Apnea: no Snoring: no Depressed/anxious mood: no Recent stress: no Restless legs/nocturnal leg cramps: no Chronic pain/arthritis: no History of sleep study: no Treatments attempted: melatonin, uinsom, benadryl and ambien    Relevant past medical, surgical, family and social history reviewed and updated as indicated. Interim medical history since our last visit reviewed. Allergies and medications reviewed and  updated.  Review of Systems  Constitutional: Negative.   Respiratory: Negative.   Genitourinary: Negative.   Neurological: Negative.   Psychiatric/Behavioral: Positive for confusion, hallucinations and sleep disturbance. Negative for agitation, behavioral problems, decreased concentration, dysphoric mood, self-injury and suicidal ideas. The patient is not nervous/anxious and is not hyperactive.     Per HPI unless specifically indicated above     Objective:    BP 136/65   Pulse (!) 54   Temp 99.1 F (37.3 C)   Wt 144 lb (65.3 kg)   LMP 05/20/1994 (Approximate) Comment: age 77  SpO2 99%   BMI 23.89 kg/m   Wt Readings from Last 3 Encounters:  07/27/16 144 lb (65.3 kg)  03/11/16 149 lb (67.6 kg)  12/06/15 171 lb (77.6 kg)    Physical Exam  Constitutional: She is oriented to person, place, and time. She appears well-developed and well-nourished. No distress.  HENT:  Head: Normocephalic and atraumatic.  Right Ear: Hearing and external ear normal.  Left Ear: Hearing and external ear normal.  Nose: Nose normal.  Mouth/Throat: Oropharynx is clear and moist. No oropharyngeal exudate.  Eyes: Conjunctivae, EOM and lids are normal. Pupils are equal, round, and reactive to light. Right eye exhibits no discharge. Left eye exhibits no discharge. No scleral icterus.  Cardiovascular: Normal rate, regular rhythm, normal heart sounds and intact distal pulses.  Exam reveals no gallop and no friction rub.   No murmur heard. Pulmonary/Chest: Effort normal and breath sounds normal. No respiratory distress. She has no wheezes. She has no rales. She exhibits no tenderness.  Musculoskeletal: Normal range of motion.  Neurological: She is alert and oriented to person, place, and time.  Skin:  Skin is warm, dry and intact. No rash noted. She is not diaphoretic. No erythema. No pallor.  Psychiatric: She has a normal mood and affect. Her speech is normal and behavior is normal. Judgment and thought  content normal. Cognition and memory are normal.  Nursing note and vitals reviewed.   Results for orders placed or performed in visit on 07/27/16  Microscopic Examination  Result Value Ref Range   WBC, UA 6-10 (A) 0 - 5 /hpf   RBC, UA 0-2 0 - 2 /hpf   Epithelial Cells (non renal) 0-10 0 - 10 /hpf   Bacteria, UA Few None seen/Few  Microalbumin, Urine Waived  Result Value Ref Range   Microalb, Ur Waived 30 (H) 0 - 19 mg/L   Creatinine, Urine Waived 10 10 - 300 mg/dL   Microalb/Creat Ratio <30 <30 mg/g  UA/M w/rflx Culture, Routine  Result Value Ref Range   Specific Gravity, UA 1.015 1.005 - 1.030   pH, UA 6.5 5.0 - 7.5   Color, UA Yellow Yellow   Appearance Ur Clear Clear   Leukocytes, UA 1+ (A) Negative   Protein, UA Trace Negative/Trace   Glucose, UA Negative Negative   Ketones, UA Negative Negative   RBC, UA Trace (A) Negative   Bilirubin, UA Negative Negative   Urobilinogen, Ur 1.0 0.2 - 1.0 mg/dL   Nitrite, UA Negative Negative   Microscopic Examination See below:    Urinalysis Reflex Comment   Urine Culture, Routine  Result Value Ref Range   Urine Culture, Routine WILL FOLLOW       Assessment & Plan:   Problem List Items Addressed This Visit      Cardiovascular and Mediastinum   Hypertension    Under good control on recheck. Per pharmacy has been getting HCTZ from neurology. Rx cancelled today. Checking electrolytes given previous hypokalemia. Stop HCTZ. Recheck BP in 1 month.       Relevant Medications   atenolol (TENORMIN) 50 MG tablet   Other Relevant Orders   Comprehensive metabolic panel   CBC with Differential/Platelet   Microalbumin, Urine Waived (Completed)   TSH     Nervous and Auditory   Mild dementia    Follows with neurology. Previously offered geri-psych referral, which she declined. Follow up with them in December as scheduled.       Relevant Medications   QUEtiapine (SEROQUEL) 200 MG tablet     Other   Hyperlipidemia - Primary     Rechecking levels today. Await results.       Relevant Medications   atenolol (TENORMIN) 50 MG tablet   Other Relevant Orders   Comprehensive metabolic panel   CBC with Differential/Platelet   Lipid Panel w/o Chol/HDL Ratio   Insomnia    Patient wants Remus Loffler again. Discussed again with her that we cannot write this. Will increase her seroquel to 200mg  qHS for sleep and hallucinations. Recheck 1 month, hopefully with daughter.       Relevant Orders   Comprehensive metabolic panel   CBC with Differential/Platelet   TSH   Hallucinations    Follows with neurology. Previously offered geri-psych referral, which she declined. Follow up with them in December as scheduled.       Relevant Orders   Comprehensive metabolic panel   CBC with Differential/Platelet   TSH   UA/M w/rflx Culture, Routine (Completed)   Hgb A1c w/o eAG    Other Visit Diagnoses    Pyuria       Will await  culture and treat as needed.    Hypokalemia       Per pharmacy has been getting HCTZ from neurology. Rx cancelled today. Checking electrolytes given previous hypokalemia. Stop HCTZ. Recheck BP in 1 month.         Follow up plan: Return in about 4 weeks (around 08/24/2016) for Follow up sleep .

## 2016-07-27 NOTE — Patient Instructions (Addendum)
DO NOT TAKE MIRTAZAPINE OR HYDROCHLOROTHIAZIDE ANY MORE  START THE NEW DOSE OF THE SLEEPING PILL

## 2016-07-27 NOTE — Assessment & Plan Note (Signed)
Patient wants Remus Loffler again. Discussed again with her that we cannot write this. Will increase her seroquel to 200mg  qHS for sleep and hallucinations. Recheck 1 month, hopefully with daughter.

## 2016-07-27 NOTE — Assessment & Plan Note (Signed)
Follows with neurology. Previously offered geri-psych referral, which she declined. Follow up with them in December as scheduled.  

## 2016-07-27 NOTE — Assessment & Plan Note (Signed)
Follows with neurology. Previously offered geri-psych referral, which she declined. Follow up with them in December as scheduled.

## 2016-07-27 NOTE — Assessment & Plan Note (Signed)
Under good control on recheck. Per pharmacy has been getting HCTZ from neurology. Rx cancelled today. Checking electrolytes given previous hypokalemia. Stop HCTZ. Recheck BP in 1 month.

## 2016-07-27 NOTE — Assessment & Plan Note (Signed)
Rechecking levels today. Await results.  

## 2016-07-28 ENCOUNTER — Telehealth: Payer: Self-pay | Admitting: Family Medicine

## 2016-07-28 LAB — LIPID PANEL W/O CHOL/HDL RATIO
CHOLESTEROL TOTAL: 235 mg/dL — AB (ref 100–199)
HDL: 72 mg/dL (ref >39)
LDL Calculated: 144 mg/dL — ABNORMAL HIGH (ref 0–99)
TRIGLYCERIDES: 94 mg/dL (ref 0–149)
VLDL CHOLESTEROL CAL: 19 mg/dL (ref 5–40)

## 2016-07-28 LAB — CBC WITH DIFFERENTIAL/PLATELET
BASOS: 0 %
Basophils Absolute: 0 10*3/uL (ref 0.0–0.2)
EOS (ABSOLUTE): 0 10*3/uL (ref 0.0–0.4)
EOS: 0 %
Hematocrit: 41.3 % (ref 34.0–46.6)
Hemoglobin: 14.1 g/dL (ref 11.1–15.9)
IMMATURE GRANS (ABS): 0 10*3/uL (ref 0.0–0.1)
IMMATURE GRANULOCYTES: 0 %
LYMPHS: 29 %
Lymphocytes Absolute: 1.5 10*3/uL (ref 0.7–3.1)
MCH: 32.1 pg (ref 26.6–33.0)
MCHC: 34.1 g/dL (ref 31.5–35.7)
MCV: 94 fL (ref 79–97)
MONOCYTES: 10 %
Monocytes Absolute: 0.5 10*3/uL (ref 0.1–0.9)
NEUTROS ABS: 3.2 10*3/uL (ref 1.4–7.0)
NEUTROS PCT: 61 %
PLATELETS: 291 10*3/uL (ref 150–379)
RBC: 4.39 x10E6/uL (ref 3.77–5.28)
RDW: 12.8 % (ref 12.3–15.4)
WBC: 5.3 10*3/uL (ref 3.4–10.8)

## 2016-07-28 LAB — COMPREHENSIVE METABOLIC PANEL
A/G RATIO: 1.5 (ref 1.2–2.2)
ALT: 16 IU/L (ref 0–32)
AST: 28 IU/L (ref 0–40)
Albumin: 4.2 g/dL (ref 3.5–4.8)
Alkaline Phosphatase: 61 IU/L (ref 39–117)
BUN/Creatinine Ratio: 8 — ABNORMAL LOW (ref 12–28)
BUN: 8 mg/dL (ref 8–27)
Bilirubin Total: 0.7 mg/dL (ref 0.0–1.2)
CALCIUM: 9.9 mg/dL (ref 8.7–10.3)
CO2: 27 mmol/L (ref 18–29)
CREATININE: 1 mg/dL (ref 0.57–1.00)
Chloride: 99 mmol/L (ref 96–106)
GFR, EST AFRICAN AMERICAN: 64 mL/min/{1.73_m2} (ref >59)
GFR, EST NON AFRICAN AMERICAN: 55 mL/min/{1.73_m2} — AB (ref >59)
Globulin, Total: 2.8 g/dL (ref 1.5–4.5)
Glucose: 122 mg/dL — ABNORMAL HIGH (ref 65–99)
POTASSIUM: 3.9 mmol/L (ref 3.5–5.2)
Sodium: 141 mmol/L (ref 134–144)
TOTAL PROTEIN: 7 g/dL (ref 6.0–8.5)

## 2016-07-28 LAB — HGB A1C W/O EAG: Hgb A1c MFr Bld: 5.1 % (ref 4.8–5.6)

## 2016-07-28 LAB — TSH: TSH: 1.2 u[IU]/mL (ref 0.450–4.500)

## 2016-07-28 NOTE — Telephone Encounter (Signed)
Please let her know that her labs look good. Thanks!

## 2016-07-28 NOTE — Telephone Encounter (Signed)
Left message, letting patient know that her labs were normal.

## 2016-07-29 LAB — UA/M W/RFLX CULTURE, ROUTINE
BILIRUBIN UA: NEGATIVE
Glucose, UA: NEGATIVE
KETONES UA: NEGATIVE
Nitrite, UA: NEGATIVE
Specific Gravity, UA: 1.015 (ref 1.005–1.030)
UUROB: 1 mg/dL (ref 0.2–1.0)
pH, UA: 6.5 (ref 5.0–7.5)

## 2016-07-29 LAB — MICROSCOPIC EXAMINATION

## 2016-07-29 LAB — MICROALBUMIN, URINE WAIVED
Creatinine, Urine Waived: 10 mg/dL (ref 10–300)
MICROALB, UR WAIVED: 30 mg/L — AB (ref 0–19)
Microalb/Creat Ratio: <30 mg/g (ref <30)

## 2016-07-29 LAB — URINE CULTURE, REFLEX

## 2016-08-06 ENCOUNTER — Telehealth: Payer: Self-pay | Admitting: Family Medicine

## 2016-08-06 NOTE — Telephone Encounter (Signed)
She is not supposed to take that any more.  Patient has dementia. You may want to call her daughter.

## 2016-08-06 NOTE — Telephone Encounter (Signed)
I need to know what medication that she needs a refill on, she has a few different medications.

## 2016-08-06 NOTE — Telephone Encounter (Signed)
Patient does not currently take this  Medication.

## 2016-09-01 ENCOUNTER — Telehealth: Payer: Self-pay | Admitting: Family Medicine

## 2016-09-01 ENCOUNTER — Ambulatory Visit: Payer: Commercial Managed Care - HMO | Admitting: Family Medicine

## 2016-09-01 NOTE — Telephone Encounter (Signed)
Called to see if we could reschedule the pts appt. She stated that her car won't start and she would have to try to get a rental. I told the pt to call us when she was able to come in. After getting off the phone I called the pts daughter to see when we could get her worked in and she proceeded to tell me that though the pts car is messed up she had already told her last night that she wasn't going to come because her daughter would have to be there. She said after this the pt called a total of 10 times cursing her out about how it's her fault that she has been taken off of certain medications and she doesn't want her at her appt. Ms [name] stated that the pt doesn't understand why she was taken off of her BP medication and she wants them back. She would also like to inform the PCP that the pt drinks on average 18 small cans of miller light a day starting at around 1 in the afternoon. She is not aware that the pt drinks and drives but she is concerned about the amount of beer the pt drinks while taking medication. Ms [name] would also like to know if a CT scan or a test could be ordered to determine how far along the pts dementia is.

## 2016-09-01 NOTE — Telephone Encounter (Signed)
Routing to provider  

## 2016-09-01 NOTE — Telephone Encounter (Signed)
Phone call Discussed with patient's daughter. 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 Fortunately her car is broken down and just walked to the grocery store to get her beer. She is very combative about everything. Reviewed Dr. Daisy Blossom notes and patient taking Seroquel and large dose alcohol and Aricept. Reviewed different strategies and they have either been tried or not viable. After further discussion decided with patient's daughter that may be the best course of action will be to let her mother die in peace and be comfortable and her last days.

## 2016-09-01 NOTE — Telephone Encounter (Signed)
Call daughter

## 2016-09-27 ENCOUNTER — Ambulatory Visit: Payer: Commercial Managed Care - HMO | Admitting: Family Medicine

## 2017-01-19 ENCOUNTER — Ambulatory Visit (INDEPENDENT_AMBULATORY_CARE_PROVIDER_SITE_OTHER): Payer: Medicare HMO | Admitting: Family Medicine

## 2017-01-19 ENCOUNTER — Encounter: Payer: Self-pay | Admitting: Family Medicine

## 2017-01-19 DIAGNOSIS — F0390 Unspecified dementia without behavioral disturbance: Secondary | ICD-10-CM

## 2017-01-19 DIAGNOSIS — G4701 Insomnia due to medical condition: Secondary | ICD-10-CM

## 2017-01-19 DIAGNOSIS — F03A0 Unspecified dementia, mild, without behavioral disturbance, psychotic disturbance, mood disturbance, and anxiety: Secondary | ICD-10-CM

## 2017-01-19 NOTE — Assessment & Plan Note (Addendum)
Discussed with patient's memory loss and dementia. Encouraged to start Aricept back again. Wrote on bottle to help with patient's memory. Discuss concerned about weight loss and living alone. If problems persist will need to discuss with family.

## 2017-01-19 NOTE — Progress Notes (Signed)
BP 136/80 (BP Location: Left Arm)   Pulse 75   Ht 5\' 5"  (1.651 m)   Wt 139 lb 3.2 oz (63.1 kg)   LMP 05/20/1994 (Approximate) Comment: age 76  SpO2 96%   BMI 23.16 kg/m    Subjective:    Patient ID: Tara Huffman, female    DOB: 1941-04-28, 76 y.o.   MRN: [id number]  HPI: Tara Huffman is a 76 y.o. female  Chief Complaint  Patient presents with  . Follow-up  . Insomnia  . Anorexia  Patient with history of seeing things at night. These hallucinations have been ongoing as a consequence of these hallucinations stopped for a Don's a pill and Seroquel. This is been for 2 week. Hasn't noticed any difference. Patient also complaining of ongoing insomnia. This is been a lifelong problem and as tried multiple medications throughout her adult life with no real help. I reassured patient that at this point just continue to live with her intermittent insomnia his medications might make hallucinations worse. Patient noted Seroquel did not help with her sleep. Patient here by herself. Was driven by a friend.  Patient's also noted weight loss over the last year of 10 pounds feels it's due to her just not being hungry and being busy. Pretty much just forgets to eat.  Relevant past medical, surgical, family and social history reviewed and updated as indicated. Interim medical history since our last visit reviewed. Allergies and medications reviewed and updated.  Review of Systems  Constitutional: Negative.   Respiratory: Negative.   Cardiovascular: Negative.     Per HPI unless specifically indicated above     Objective:    BP 136/80 (BP Location: Left Arm)   Pulse 75   Ht 5\' 5"  (1.651 m)   Wt 139 lb 3.2 oz (63.1 kg)   LMP 05/20/1994 (Approximate) Comment: age 83  SpO2 96%   BMI 23.16 kg/m   Wt Readings from Last 3 Encounters:  01/19/17 139 lb 3.2 oz (63.1 kg)  07/27/16 144 lb (65.3 kg)  03/11/16 149 lb (67.6 kg)    Physical Exam  Constitutional: She is oriented to  person, place, and time. She appears well-developed and well-nourished.  HENT:  Head: Normocephalic and atraumatic.  Eyes: Conjunctivae and EOM are normal.  Neck: Normal range of motion.  Cardiovascular: Normal rate, regular rhythm and normal heart sounds.   Pulmonary/Chest: Effort normal and breath sounds normal.  Musculoskeletal: Normal range of motion.  Neurological: She is alert and oriented to person, place, and time.  The mental status exam score of 24 see copy for details.  Skin: No erythema.  Psychiatric: She has a normal mood and affect. Her behavior is normal. Thought content normal.    Results for orders placed or performed in visit on 07/27/16  Microscopic Examination  Result Value Ref Range   WBC, UA 6-10 (A) 0 - 5 /hpf   RBC, UA 0-2 0 - 2 /hpf   Epithelial Cells (non renal) 0-10 0 - 10 /hpf   Bacteria, UA Few None seen/Few  Comprehensive metabolic panel  Result Value Ref Range   Glucose 122 (H) 65 - 99 mg/dL   BUN 8 8 - 27 mg/dL   Creatinine, Ser 2.19 0.57 - 1.00 mg/dL   GFR calc non Af Amer 55 (L) >59 mL/min/1.73   GFR calc Af Amer 64 >59 mL/min/1.73   BUN/Creatinine Ratio 8 (L) 12 - 28   Sodium 141 134 - 144 mmol/L   Potassium  3.9 3.5 - 5.2 mmol/L   Chloride 99 96 - 106 mmol/L   CO2 27 18 - 29 mmol/L   Calcium 9.9 8.7 - 10.3 mg/dL   Total Protein 7.0 6.0 - 8.5 g/dL   Albumin 4.2 3.5 - 4.8 g/dL   Globulin, Total 2.8 1.5 - 4.5 g/dL   Albumin/Globulin Ratio 1.5 1.2 - 2.2   Bilirubin Total 0.7 0.0 - 1.2 mg/dL   Alkaline Phosphatase 61 39 - 117 IU/L   AST 28 0 - 40 IU/L   ALT 16 0 - 32 IU/L  CBC with Differential/Platelet  Result Value Ref Range   WBC 5.3 3.4 - 10.8 x10E3/uL   RBC 4.39 3.77 - 5.28 x10E6/uL   Hemoglobin 14.1 11.1 - 15.9 g/dL   Hematocrit 13.0 86.5 - 46.6 %   MCV 94 79 - 97 fL   MCH 32.1 26.6 - 33.0 pg   MCHC 34.1 31.5 - 35.7 g/dL   RDW 78.4 69.6 - 29.5 %   Platelets 291 150 - 379 x10E3/uL   Neutrophils 61 Not Estab. %   Lymphs 29 Not  Estab. %   Monocytes 10 Not Estab. %   Eos 0 Not Estab. %   Basos 0 Not Estab. %   Neutrophils Absolute 3.2 1.4 - 7.0 x10E3/uL   Lymphocytes Absolute 1.5 0.7 - 3.1 x10E3/uL   Monocytes Absolute 0.5 0.1 - 0.9 x10E3/uL   EOS (ABSOLUTE) 0.0 0.0 - 0.4 x10E3/uL   Basophils Absolute 0.0 0.0 - 0.2 x10E3/uL   Immature Granulocytes 0 Not Estab. %   Immature Grans (Abs) 0.0 0.0 - 0.1 x10E3/uL  Lipid Panel w/o Chol/HDL Ratio  Result Value Ref Range   Cholesterol, Total 235 (H) 100 - 199 mg/dL   Triglycerides 94 0 - 149 mg/dL   HDL 72 >28 mg/dL   VLDL Cholesterol Cal 19 5 - 40 mg/dL   LDL Calculated 413 (H) 0 - 99 mg/dL  Microalbumin, Urine Waived  Result Value Ref Range   Microalb, Ur Waived 30 (H) 0 - 19 mg/L   Creatinine, Urine Waived 10 10 - 300 mg/dL   Microalb/Creat Ratio <30 <30 mg/g  TSH  Result Value Ref Range   TSH 1.200 0.450 - 4.500 uIU/mL  UA/M w/rflx Culture, Routine  Result Value Ref Range   Specific Gravity, UA 1.015 1.005 - 1.030   pH, UA 6.5 5.0 - 7.5   Color, UA Yellow Yellow   Appearance Ur Clear Clear   Leukocytes, UA 1+ (A) Negative   Protein, UA Trace Negative/Trace   Glucose, UA Negative Negative   Ketones, UA Negative Negative   RBC, UA Trace (A) Negative   Bilirubin, UA Negative Negative   Urobilinogen, Ur 1.0 0.2 - 1.0 mg/dL   Nitrite, UA Negative Negative   Microscopic Examination See below:    Urinalysis Reflex Comment   Hgb A1c w/o eAG  Result Value Ref Range   Hgb A1c MFr Bld 5.1 4.8 - 5.6 %  Urine Culture, Routine  Result Value Ref Range   Urine Culture, Routine Final report    Urine Culture result 1 Comment       Assessment & Plan:   Problem List Items Addressed This Visit      Nervous and Auditory   Mild dementia    Discussed with patient's memory loss and dementia. Encouraged to start Aricept back again. Wrote on bottle to help with patient's memory. Discuss concerned about weight loss and living alone. If problems persist will need  to  discuss with family.        Other   Insomnia    Discussed with patient's insomnia has been chronic heard.wife will leave alone patient interested in starting back Seroquel 25 mg at bedtime is maybe that did help and gave written directions again that that was okay.          Follow up plan: Return in about 3 months (around 04/20/2017) for Physical Exam .

## 2017-01-19 NOTE — Assessment & Plan Note (Signed)
Discussed with patient's insomnia has been chronic heard.wife will leave alone patient interested in starting back Seroquel 25 mg at bedtime is maybe that did help and gave written directions again that that was okay.

## 2017-01-26 ENCOUNTER — Telehealth: Payer: Self-pay | Admitting: Family Medicine

## 2017-01-26 NOTE — Telephone Encounter (Signed)
Patient cancelled the appt for tomorrow with Dr Laural Benes..per patient no transportation.  She is going to call back to reschedule it.    She would still like to talk to someone regarding the medication she is on for sleeping causing hallucinations.

## 2017-01-26 NOTE — Telephone Encounter (Signed)
Per the patient she  was given something to help her with sleep but it has caused her to have hallucinations. She said she this medication is too strong for her.  She has an appt on 4/12 with Dr Laural Benes regarding her sleep issues.  [phone]   Thanks

## 2017-01-27 ENCOUNTER — Ambulatory Visit: Payer: Medicare HMO | Admitting: Family Medicine

## 2017-01-27 NOTE — Telephone Encounter (Signed)
Attempted to reach pt line is still busy.

## 2017-01-27 NOTE — Telephone Encounter (Signed)
Attempted to reach line is busy.

## 2017-01-31 NOTE — Telephone Encounter (Signed)
Patient called regarding medication question.  Was transferred to [facility]

## 2017-01-31 NOTE — Telephone Encounter (Signed)
Per last OV, "Discussed with patient's insomnia has been chronic heard.wife will leave alone patient interested in starting back Seroquel 25 mg at bedtime is maybe that did help and gave written directions again that that was okay." When reviewing medication it appears Seroquel 200 mg was prescribed. Pharmacist told pt that this medication can cause death in dementia patients and she is now very worried. Please advise   Pt also states she is having few and less frequent hallucinations. States she has not had one in about a week.

## 2017-02-15 ENCOUNTER — Ambulatory Visit: Payer: Medicare HMO | Admitting: Family Medicine

## 2017-03-10 ENCOUNTER — Other Ambulatory Visit: Payer: Self-pay | Admitting: Family Medicine

## 2017-03-23 ENCOUNTER — Telehealth: Payer: Self-pay | Admitting: Family Medicine

## 2017-03-23 NOTE — Telephone Encounter (Signed)
Called pt to schedule Annual Wellness Visit with [facility]  - knb  °

## 2017-03-29 ENCOUNTER — Encounter: Payer: Self-pay | Admitting: Family Medicine

## 2017-03-29 ENCOUNTER — Telehealth: Payer: Self-pay | Admitting: Family Medicine

## 2017-03-29 ENCOUNTER — Ambulatory Visit (INDEPENDENT_AMBULATORY_CARE_PROVIDER_SITE_OTHER): Payer: Medicare HMO | Admitting: Family Medicine

## 2017-03-29 VITALS — BP 131/75 | HR 60 | Temp 99.4°F | Wt 134.5 lb

## 2017-03-29 DIAGNOSIS — F03A0 Unspecified dementia, mild, without behavioral disturbance, psychotic disturbance, mood disturbance, and anxiety: Secondary | ICD-10-CM

## 2017-03-29 DIAGNOSIS — M545 Low back pain, unspecified: Secondary | ICD-10-CM

## 2017-03-29 DIAGNOSIS — I10 Essential (primary) hypertension: Secondary | ICD-10-CM | POA: Diagnosis not present

## 2017-03-29 DIAGNOSIS — F0390 Unspecified dementia without behavioral disturbance: Secondary | ICD-10-CM

## 2017-03-29 DIAGNOSIS — G4701 Insomnia due to medical condition: Secondary | ICD-10-CM | POA: Diagnosis not present

## 2017-03-29 DIAGNOSIS — E782 Mixed hyperlipidemia: Secondary | ICD-10-CM | POA: Diagnosis not present

## 2017-03-29 LAB — UA/M W/RFLX CULTURE, ROUTINE
Bilirubin, UA: NEGATIVE
GLUCOSE, UA: NEGATIVE
Ketones, UA: NEGATIVE
Leukocytes, UA: NEGATIVE
Nitrite, UA: NEGATIVE
PH UA: 5 (ref 5.0–7.5)
Specific Gravity, UA: >1.03 — ABNORMAL HIGH (ref 1.005–1.030)
UUROB: 0.2 mg/dL (ref 0.2–1.0)

## 2017-03-29 LAB — MICROSCOPIC EXAMINATION
BACTERIA UA: NONE SEEN
RBC MICROSCOPIC, UA: NONE SEEN /HPF (ref 0–?)

## 2017-03-29 LAB — MICROALBUMIN, URINE WAIVED
CREATININE, URINE WAIVED: 300 mg/dL (ref 10–300)
Microalb, Ur Waived: 80 mg/L — ABNORMAL HIGH (ref 0–19)

## 2017-03-29 MED ORDER — LIDOCAINE 5 % EX PTCH: 1.0000 | MEDICATED_PATCH | CUTANEOUS | 6 refills | Status: DC

## 2017-03-29 MED ORDER — QUETIAPINE FUMARATE 25 MG PO TABS: ORAL_TABLET | ORAL | 1 refills | Status: DC

## 2017-03-29 MED ORDER — MIRTAZAPINE 15 MG PO TABS: 15.0000 mg | ORAL_TABLET | Freq: Every day | ORAL | 1 refills | Status: DC

## 2017-03-29 NOTE — Telephone Encounter (Signed)
Patient's daughter notified.

## 2017-03-29 NOTE — Telephone Encounter (Signed)
Please let her daughter know I just sent through a new appetite medicine for her (remeron)  Can you also make sure that her pharmacy doesn't have any other medicines on hold for her- just what we have on the list?

## 2017-03-29 NOTE — Progress Notes (Signed)
BP 131/75 (BP Location: Left Arm, Patient Position: Sitting, Cuff Size: Normal)   Pulse 60   Temp 99.4 F (37.4 C)   Wt 134 lb 8 oz (61 kg)   LMP 05/20/1994 (Approximate) Comment: age 76  SpO2 97%   BMI 22.38 kg/m    Subjective:    Patient ID: Tara Huffman, female    DOB: 09/29/1941, 76 y.o.   MRN: [id number]  HPI: Tara Huffman is a 76 y.o. female  Chief Complaint  Patient presents with  . Back Pain  . Other    Patient is not sure what dose of Seroquel that she is taking   BACK PAIN Duration: 3-4 days ago Mechanism of injury: unknown Location: midline and low back Onset: sudden Severity: mild- just with getting up Quality: sore Frequency: only with getting up Radiation: none Aggravating factors: getting up Alleviating factors: salonpas patch, bengay Status: better Treatments attempted: patches and rest  Relief with NSAIDs?: No NSAIDs Taken Nighttime pain:  no Paresthesias / decreased sensation:  no Bowel / bladder incontinence:  no Fevers:  no Dysuria / urinary frequency:  no  HYPERTENSION / HYPERLIPIDEMIA Satisfied with current treatment? yes Duration of hypertension: chronic BP monitoring frequency: not checking BP medication side effects: no Past BP meds: atenolol Duration of hyperlipidemia: chronic Cholesterol medication side effects: no Cholesterol supplements: none Aspirin: no Recent stressors: no Recurrent headaches: no Visual changes: no Palpitations: no Dyspnea: no Chest pain: no Lower extremity edema: no Dizzy/lightheaded: no  She doesn't know what dose of seroquel she is taking. She still hasn't been sleeping well. Her daughter notes that she is consistently losing weight and forgets to eat. Would like something for her appetite.   Relevant past medical, surgical, family and social history reviewed and updated as indicated. Interim medical history since our last visit reviewed. Allergies and medications reviewed and  updated.  Review of Systems  Constitutional: Negative.   Respiratory: Negative.   Cardiovascular: Negative.   Musculoskeletal: Positive for back pain. Negative for arthralgias, gait problem, joint swelling, myalgias, neck pain and neck stiffness.  Psychiatric/Behavioral: Positive for sleep disturbance. Negative for agitation, behavioral problems, confusion, decreased concentration, dysphoric mood, hallucinations, self-injury and suicidal ideas. The patient is not nervous/anxious and is not hyperactive.     Per HPI unless specifically indicated above     Objective:    BP 131/75 (BP Location: Left Arm, Patient Position: Sitting, Cuff Size: Normal)   Pulse 60   Temp 99.4 F (37.4 C)   Wt 134 lb 8 oz (61 kg)   LMP 05/20/1994 (Approximate) Comment: age 18  SpO2 97%   BMI 22.38 kg/m   Wt Readings from Last 3 Encounters:  03/29/17 134 lb 8 oz (61 kg)  01/19/17 139 lb 3.2 oz (63.1 kg)  07/27/16 144 lb (65.3 kg)    Physical Exam  Constitutional: She is oriented to person, place, and time. She appears well-developed and well-nourished. No distress.  HENT:  Head: Normocephalic and atraumatic.  Right Ear: Hearing normal.  Left Ear: Hearing normal.  Nose: Nose normal.  Eyes: Conjunctivae and lids are normal. Right eye exhibits no discharge. Left eye exhibits no discharge. No scleral icterus.  Cardiovascular: Normal rate, regular rhythm, normal heart sounds and intact distal pulses.  Exam reveals no gallop and no friction rub.   No murmur heard. Pulmonary/Chest: Effort normal and breath sounds normal. No respiratory distress. She has no wheezes. She has no rales. She exhibits no tenderness.  Musculoskeletal: Normal range  of motion.  Neurological: She is alert and oriented to person, place, and time.  Skin: Skin is warm, dry and intact. No rash noted. She is not diaphoretic. No erythema. No pallor.  Psychiatric: She has a normal mood and affect. Her speech is normal and behavior is  normal. Judgment and thought content normal. Cognition and memory are normal.  Nursing note and vitals reviewed. Back Exam:    Inspection:  Normal spinal curvature.  No deformity, ecchymosis, erythema, or lesions     Palpation:     Midline spinal tenderness: no      Paralumbar tenderness: yes Right     Parathoracic tenderness: no      Buttocks tenderness: no     Range of Motion:      Flexion: Fingers to Knees     Extension:Normal     Lateral bending:Normal    Rotation:Normal    Neuro Exam:Lower extremity DTRs normal & symmetric.  Strength and sensation intact.    Special Tests:      Straight leg raise:negative   Results for orders placed or performed in visit on 03/29/17  Microscopic Examination  Result Value Ref Range   WBC, UA 0-5 0 - 5 /hpf   RBC, UA None seen 0 - 2 /hpf   Epithelial Cells (non renal) 0-10 0 - 10 /hpf   Mucus, UA Present (A) Not Estab.   Bacteria, UA None seen None seen/Few  Microalbumin, Urine Waived  Result Value Ref Range   Microalb, Ur Waived 80 (H) 0 - 19 mg/L   Creatinine, Urine Waived 300 10 - 300 mg/dL   Microalb/Creat Ratio <30 <30 mg/g  UA/M w/rflx Culture, Routine  Result Value Ref Range   Specific Gravity, UA >1.030 (H) 1.005 - 1.030   pH, UA 5.0 5.0 - 7.5   Color, UA Yellow Yellow   Appearance Ur Cloudy (A) Clear   Leukocytes, UA Negative Negative   Protein, UA Trace (A) Negative/Trace   Glucose, UA Negative Negative   Ketones, UA Negative Negative   RBC, UA Trace (A) Negative   Bilirubin, UA Negative Negative   Urobilinogen, Ur 0.2 0.2 - 1.0 mg/dL   Nitrite, UA Negative Negative   Microscopic Examination See below:       Assessment & Plan:   Problem List Items Addressed This Visit      Cardiovascular and Mediastinum   Hypertension - Primary    Under good control. Continue current regimen. Continue to monitor. Call with any concerns.       Relevant Orders   CBC with Differential/Platelet   Comprehensive metabolic panel    Microalbumin, Urine Waived (Completed)   TSH   UA/M w/rflx Culture, Routine (Completed)     Nervous and Auditory   Mild dementia    Continue aricept. Continue to follow with neurology. Call with any concerns.       Relevant Medications   QUEtiapine (SEROQUEL) 25 MG tablet   mirtazapine (REMERON) 15 MG tablet   Other Relevant Orders   CBC with Differential/Platelet   Comprehensive metabolic panel   UA/M w/rflx Culture, Routine (Completed)     Other   Hyperlipidemia    Rechecking levels today. Await results.       Relevant Orders   CBC with Differential/Platelet   Comprehensive metabolic panel   Lipid Panel w/o Chol/HDL Ratio   UA/M w/rflx Culture, Routine (Completed)   Insomnia    Confirmed that she should be on 25mg  of seroquel, will also restart remeron  to help with sleep and appetite.       Relevant Orders   CBC with Differential/Platelet   Comprehensive metabolic panel   UA/M w/rflx Culture, Routine (Completed)    Other Visit Diagnoses    Acute midline low back pain without sciatica       Will treat with lidocaine patch. Call with any concerns.    Relevant Orders   CBC with Differential/Platelet   Comprehensive metabolic panel   UA/M w/rflx Culture, Routine (Completed)       Follow up plan: Return in about 3 months (around 06/29/2017) for Wellness/Physical with Dr. Dossie Arbour.

## 2017-03-29 NOTE — Assessment & Plan Note (Signed)
Continue aricept. Continue to follow with neurology. Call with any concerns.

## 2017-03-29 NOTE — Telephone Encounter (Signed)
Please advise.   Vitals with BMI 03/29/2017 01/19/2017 01/19/2017 07/27/2016 07/27/2016  Weight 134 lbs 8 oz  139 lbs 3 oz  144 lbs   Vitals with BMI 03/11/2016 12/06/2015 12/06/2015 12/06/2015 07/29/2015  Weight 149 lbs   171 lbs    Vitals with BMI 07/29/2015 05/29/2015 02/04/2015  Weight 152 lbs 150 lbs 10 oz 152 lbs

## 2017-03-29 NOTE — Assessment & Plan Note (Signed)
Under good control. Continue current regimen. Continue to monitor. Call with any concerns. 

## 2017-03-29 NOTE — Telephone Encounter (Signed)
Patient has been experiencing loss of appetite and is losing weight. Patient forgot to mention to provider for today's appointment with Dr. Laural Benes that she has not been eating and losing weight. Patient's daughter wanted to know if there was something patient could take to help her appetite.   Please Advise.  Thank you

## 2017-03-29 NOTE — Assessment & Plan Note (Signed)
Confirmed that she should be on 25mg  of seroquel, will also restart remeron to help with sleep and appetite.

## 2017-03-29 NOTE — Assessment & Plan Note (Signed)
Rechecking levels today. Await results.  

## 2017-03-30 ENCOUNTER — Encounter: Payer: Self-pay | Admitting: Family Medicine

## 2017-03-30 LAB — LIPID PANEL W/O CHOL/HDL RATIO
Cholesterol, Total: 197 mg/dL (ref 100–199)
HDL: 72 mg/dL (ref >39)
LDL Calculated: 108 mg/dL — ABNORMAL HIGH (ref 0–99)
TRIGLYCERIDES: 83 mg/dL (ref 0–149)
VLDL Cholesterol Cal: 17 mg/dL (ref 5–40)

## 2017-03-30 LAB — COMPREHENSIVE METABOLIC PANEL
A/G RATIO: 1.4 (ref 1.2–2.2)
ALT: 14 IU/L (ref 0–32)
AST: 25 IU/L (ref 0–40)
Albumin: 3.9 g/dL (ref 3.5–4.8)
Alkaline Phosphatase: 68 IU/L (ref 39–117)
BILIRUBIN TOTAL: 0.5 mg/dL (ref 0.0–1.2)
BUN/Creatinine Ratio: 10 — ABNORMAL LOW (ref 12–28)
BUN: 9 mg/dL (ref 8–27)
CALCIUM: 9.5 mg/dL (ref 8.7–10.3)
CO2: 23 mmol/L (ref 20–29)
Chloride: 103 mmol/L (ref 96–106)
Creatinine, Ser: 0.86 mg/dL (ref 0.57–1.00)
GFR, EST AFRICAN AMERICAN: 76 mL/min/{1.73_m2} (ref >59)
GFR, EST NON AFRICAN AMERICAN: 66 mL/min/{1.73_m2} (ref >59)
GLOBULIN, TOTAL: 2.8 g/dL (ref 1.5–4.5)
Glucose: 83 mg/dL (ref 65–99)
POTASSIUM: 4.5 mmol/L (ref 3.5–5.2)
SODIUM: 141 mmol/L (ref 134–144)
Total Protein: 6.7 g/dL (ref 6.0–8.5)

## 2017-03-30 LAB — CBC WITH DIFFERENTIAL/PLATELET
BASOS: 1 %
Basophils Absolute: 0 10*3/uL (ref 0.0–0.2)
EOS (ABSOLUTE): 0 10*3/uL (ref 0.0–0.4)
EOS: 1 %
HEMATOCRIT: 42.2 % (ref 34.0–46.6)
Hemoglobin: 14.1 g/dL (ref 11.1–15.9)
IMMATURE GRANULOCYTES: 0 %
Immature Grans (Abs): 0 10*3/uL (ref 0.0–0.1)
LYMPHS ABS: 2 10*3/uL (ref 0.7–3.1)
Lymphs: 41 %
MCH: 30.5 pg (ref 26.6–33.0)
MCHC: 33.4 g/dL (ref 31.5–35.7)
MCV: 91 fL (ref 79–97)
MONOS ABS: 0.5 10*3/uL (ref 0.1–0.9)
Monocytes: 10 %
NEUTROS ABS: 2.4 10*3/uL (ref 1.4–7.0)
Neutrophils: 47 %
Platelets: 310 10*3/uL (ref 150–379)
RBC: 4.63 x10E6/uL (ref 3.77–5.28)
RDW: 12.8 % (ref 12.3–15.4)
WBC: 4.9 10*3/uL (ref 3.4–10.8)

## 2017-03-30 LAB — TSH: TSH: 1.18 u[IU]/mL (ref 0.450–4.500)

## 2017-04-21 ENCOUNTER — Telehealth: Payer: Self-pay | Admitting: Family Medicine

## 2017-04-21 NOTE — Telephone Encounter (Signed)
Called pt to schedule Annual Wellness Visit with [facility]  - knb  °

## 2017-04-22 NOTE — Telephone Encounter (Signed)
Pt stated her car was in the shop and she would call back to schedule this appt.

## 2017-04-26 ENCOUNTER — Encounter: Payer: Medicare HMO | Admitting: Family Medicine

## 2017-05-09 ENCOUNTER — Telehealth: Payer: Self-pay | Admitting: Family Medicine

## 2017-05-09 NOTE — Telephone Encounter (Signed)
Called pt to schedule Annual Wellness Visit with [facility]  - knb  °

## 2017-05-28 ENCOUNTER — Other Ambulatory Visit: Payer: Self-pay | Admitting: Unknown Physician Specialty

## 2017-07-18 ENCOUNTER — Ambulatory Visit (INDEPENDENT_AMBULATORY_CARE_PROVIDER_SITE_OTHER): Payer: Medicare HMO | Admitting: Family Medicine

## 2017-07-18 ENCOUNTER — Encounter: Payer: Self-pay | Admitting: Family Medicine

## 2017-07-18 VITALS — BP 143/77 | HR 65 | Temp 98.0°F | Ht 65.3 in | Wt 128.1 lb

## 2017-07-18 DIAGNOSIS — F03A0 Unspecified dementia, mild, without behavioral disturbance, psychotic disturbance, mood disturbance, and anxiety: Secondary | ICD-10-CM

## 2017-07-18 DIAGNOSIS — G4701 Insomnia due to medical condition: Secondary | ICD-10-CM

## 2017-07-18 DIAGNOSIS — R634 Abnormal weight loss: Secondary | ICD-10-CM

## 2017-07-18 DIAGNOSIS — F0390 Unspecified dementia without behavioral disturbance: Secondary | ICD-10-CM | POA: Diagnosis not present

## 2017-07-18 LAB — BAYER DCA HB A1C WAIVED: HB A1C (BAYER DCA - WAIVED): 4.9 % (ref <7.0)

## 2017-07-18 MED ORDER — MIRTAZAPINE 30 MG PO TABS: 30.0000 mg | ORAL_TABLET | Freq: Every day | ORAL | 1 refills | Status: DC

## 2017-07-18 MED ORDER — QUETIAPINE FUMARATE 25 MG PO TABS: ORAL_TABLET | ORAL | 1 refills | Status: DC

## 2017-07-18 NOTE — Progress Notes (Signed)
BP (!) 143/77 (BP Location: Left Arm, Patient Position: Sitting, Cuff Size: Normal)   Pulse 65   Temp 98 F (36.7 C)   Ht 5' 5.3" (1.659 m)   Wt 128 lb 2 oz (58.1 kg)   LMP 05/20/1994 (Approximate) Comment: age 76  SpO2 99%   BMI 21.13 kg/m    Subjective:    Patient ID: Tara Huffman, female    DOB: 11/16/1940, 76 y.o.   MRN: [id number]  HPI: Tara Huffman is a 76 y.o. female  Chief Complaint  Patient presents with  . Weight Loss  . Abdominal Pain  . Allergic Reaction    Patient states that the Quetiapine makes her have hallucinations, so she tried taking half a pill, still caused them so she stopped them completely.    Here alone today. History limited by dementia.  States that her belly is not hurting. States that her legs were hurting, but she took aleve and it went away.   Stopped the aricept because she states that it was making her have hallucinations- they have gotten better since she stopped them.   INSOMNIA- Unsure if she has been taking her mirtazapine or her seroquel. She notes that she is still losing weight. She is not currently having much problem sleeping. She feels like the sleeping pill is helping, but she's not sure if she's taking it or not, she has picked up both prescriptions.  Duration: chronic Satisfied with sleep quality: yes Difficulty falling asleep: yes Difficulty staying asleep: yes Waking a few hours after sleep onset: no Early morning awakenings: yes Daytime hypersomnolence: no Wakes feeling refreshed: no Good sleep hygiene: yes Apnea: no Snoring: no Depressed/anxious mood: no Recent stress: no Restless legs/nocturnal leg cramps: no Chronic pain/arthritis: no History of sleep study: no Treatments attempted: melatonin, uinsom, benadryl and ambien    Relevant past medical, surgical, family and social history reviewed and updated as indicated. Interim medical history since our last visit reviewed. Allergies and medications  reviewed and updated.  Review of Systems  Constitutional: Negative.   Respiratory: Negative.   Cardiovascular: Negative.   Gastrointestinal: Negative.   Psychiatric/Behavioral: Positive for confusion, hallucinations and sleep disturbance. Negative for agitation, behavioral problems, decreased concentration, dysphoric mood, self-injury and suicidal ideas. The patient is not nervous/anxious and is not hyperactive.     Per HPI unless specifically indicated above     Objective:    BP (!) 143/77 (BP Location: Left Arm, Patient Position: Sitting, Cuff Size: Normal)   Pulse 65   Temp 98 F (36.7 C)   Ht 5' 5.3" (1.659 m)   Wt 128 lb 2 oz (58.1 kg)   LMP 05/20/1994 (Approximate) Comment: age 89  SpO2 99%   BMI 21.13 kg/m   Wt Readings from Last 3 Encounters:  07/18/17 128 lb 2 oz (58.1 kg)  03/29/17 134 lb 8 oz (61 kg)  01/19/17 139 lb 3.2 oz (63.1 kg)    Physical Exam  Constitutional: She is oriented to person, place, and time. She appears well-developed and well-nourished. No distress.  HENT:  Head: Normocephalic and atraumatic.  Right Ear: Hearing normal.  Left Ear: Hearing normal.  Nose: Nose normal.  Eyes: Conjunctivae and lids are normal. Right eye exhibits no discharge. Left eye exhibits no discharge. No scleral icterus.  Cardiovascular: Normal rate, regular rhythm, normal heart sounds and intact distal pulses.  Exam reveals no gallop and no friction rub.   No murmur heard. Pulmonary/Chest: Effort normal and breath sounds normal.  No respiratory distress. She has no wheezes. She has no rales. She exhibits no tenderness.  Musculoskeletal: Normal range of motion.  Neurological: She is alert and oriented to person, place, and time.  Skin: Skin is warm, dry and intact. No rash noted. She is not diaphoretic. No erythema. No pallor.  Psychiatric: She has a normal mood and affect. Her speech is normal and behavior is normal. Judgment and thought content normal. Cognition and memory  are impaired.  Nursing note and vitals reviewed.   Results for orders placed or performed in visit on 03/29/17  Microscopic Examination  Result Value Ref Range   WBC, UA 0-5 0 - 5 /hpf   RBC, UA None seen 0 - 2 /hpf   Epithelial Cells (non renal) 0-10 0 - 10 /hpf   Mucus, UA Present (A) Not Estab.   Bacteria, UA None seen None seen/Few  CBC with Differential/Platelet  Result Value Ref Range   WBC 4.9 3.4 - 10.8 x10E3/uL   RBC 4.63 3.77 - 5.28 x10E6/uL   Hemoglobin 14.1 11.1 - 15.9 g/dL   Hematocrit 16.1 09.6 - 46.6 %   MCV 91 79 - 97 fL   MCH 30.5 26.6 - 33.0 pg   MCHC 33.4 31.5 - 35.7 g/dL   RDW 04.5 40.9 - 81.1 %   Platelets 310 150 - 379 x10E3/uL   Neutrophils 47 Not Estab. %   Lymphs 41 Not Estab. %   Monocytes 10 Not Estab. %   Eos 1 Not Estab. %   Basos 1 Not Estab. %   Neutrophils Absolute 2.4 1.4 - 7.0 x10E3/uL   Lymphocytes Absolute 2.0 0.7 - 3.1 x10E3/uL   Monocytes Absolute 0.5 0.1 - 0.9 x10E3/uL   EOS (ABSOLUTE) 0.0 0.0 - 0.4 x10E3/uL   Basophils Absolute 0.0 0.0 - 0.2 x10E3/uL   Immature Granulocytes 0 Not Estab. %   Immature Grans (Abs) 0.0 0.0 - 0.1 x10E3/uL  Comprehensive metabolic panel  Result Value Ref Range   Glucose 83 65 - 99 mg/dL   BUN 9 8 - 27 mg/dL   Creatinine, Ser 9.14 0.57 - 1.00 mg/dL   GFR calc non Af Amer 66 >59 mL/min/1.73   GFR calc Af Amer 76 >59 mL/min/1.73   BUN/Creatinine Ratio 10 (L) 12 - 28   Sodium 141 134 - 144 mmol/L   Potassium 4.5 3.5 - 5.2 mmol/L   Chloride 103 96 - 106 mmol/L   CO2 23 20 - 29 mmol/L   Calcium 9.5 8.7 - 10.3 mg/dL   Total Protein 6.7 6.0 - 8.5 g/dL   Albumin 3.9 3.5 - 4.8 g/dL   Globulin, Total 2.8 1.5 - 4.5 g/dL   Albumin/Globulin Ratio 1.4 1.2 - 2.2   Bilirubin Total 0.5 0.0 - 1.2 mg/dL   Alkaline Phosphatase 68 39 - 117 IU/L   AST 25 0 - 40 IU/L   ALT 14 0 - 32 IU/L  Lipid Panel w/o Chol/HDL Ratio  Result Value Ref Range   Cholesterol, Total 197 100 - 199 mg/dL   Triglycerides 83 0 - 149 mg/dL    HDL 72 >78 mg/dL   VLDL Cholesterol Cal 17 5 - 40 mg/dL   LDL Calculated 295 (H) 0 - 99 mg/dL  Microalbumin, Urine Waived  Result Value Ref Range   Microalb, Ur Waived 80 (H) 0 - 19 mg/L   Creatinine, Urine Waived 300 10 - 300 mg/dL   Microalb/Creat Ratio <30 <30 mg/g  TSH  Result Value Ref Range  TSH 1.180 0.450 - 4.500 uIU/mL  UA/M w/rflx Culture, Routine  Result Value Ref Range   Specific Gravity, UA >1.030 (H) 1.005 - 1.030   pH, UA 5.0 5.0 - 7.5   Color, UA Yellow Yellow   Appearance Ur Cloudy (A) Clear   Leukocytes, UA Negative Negative   Protein, UA Trace (A) Negative/Trace   Glucose, UA Negative Negative   Ketones, UA Negative Negative   RBC, UA Trace (A) Negative   Bilirubin, UA Negative Negative   Urobilinogen, Ur 0.2 0.2 - 1.0 mg/dL   Nitrite, UA Negative Negative   Microscopic Examination See below:       Assessment & Plan:   Problem List Items Addressed This Visit      Nervous and Auditory   Mild dementia    Stopped her aricept on her own. Will get her geriatrics consult- will return next month for that.       Relevant Medications   mirtazapine (REMERON) 30 MG tablet   QUEtiapine (SEROQUEL) 25 MG tablet     Other   Insomnia    Unclear what she's taking at home. Note sent to her daughter. If taking the  of remeron, will increase to 30 to help with sleep and appetite. Will have her return in 1 month for geriatrics consult (will check weight then). Will have her return in 2 months for AWV with me.        Other Visit Diagnoses    Weight loss    -  Primary   Checking labs again today. Await results. Will increase her remeron to  and recheck 2 months.    Relevant Orders   CBC with Differential/Platelet   Comprehensive metabolic panel   Bayer DCA Hb Z6X Waived   TSH       Follow up plan: Return 4 weeks geriatrics consult with Dr. Hollace Hayward, for 8 weeks, Wellness visit with me.

## 2017-07-18 NOTE — Addendum Note (Signed)
Addended by: Dorcas Carrow on: 07/18/2017 11:42 AM   Modules accepted: Orders

## 2017-07-18 NOTE — Assessment & Plan Note (Signed)
Stopped her aricept on her own. Will get her geriatrics consult- will return next month for that.

## 2017-07-18 NOTE — Patient Instructions (Addendum)
CONTINUE QUETIAPINE (THE SLEEPING PILL) INCREASE MIRTAZAPINE (SLEEP AND APPETITE PILL)- TAKE THE NEW DOSE FROM THE PHARMACY

## 2017-07-18 NOTE — Assessment & Plan Note (Signed)
Unclear what she's taking at home. Note sent to her daughter. If taking the 15mg  of remeron, will increase to 30 to help with sleep and appetite. Will have her return in 1 month for geriatrics consult (will check weight then). Will have her return in 2 months for AWV with me.

## 2017-07-19 ENCOUNTER — Encounter: Payer: Self-pay | Admitting: Family Medicine

## 2017-07-19 LAB — COMPREHENSIVE METABOLIC PANEL
ALK PHOS: 60 IU/L (ref 39–117)
ALT: 12 IU/L (ref 0–32)
AST: 24 IU/L (ref 0–40)
Albumin/Globulin Ratio: 1.4 (ref 1.2–2.2)
Albumin: 3.9 g/dL (ref 3.5–4.8)
BUN/Creatinine Ratio: 13 (ref 12–28)
BUN: 11 mg/dL (ref 8–27)
Bilirubin Total: 0.5 mg/dL (ref 0.0–1.2)
CALCIUM: 9.6 mg/dL (ref 8.7–10.3)
CO2: 26 mmol/L (ref 20–29)
CREATININE: 0.85 mg/dL (ref 0.57–1.00)
Chloride: 105 mmol/L (ref 96–106)
GFR calc Af Amer: 77 mL/min/{1.73_m2} (ref >59)
GFR, EST NON AFRICAN AMERICAN: 67 mL/min/{1.73_m2} (ref >59)
GLUCOSE: 128 mg/dL — AB (ref 65–99)
Globulin, Total: 2.7 g/dL (ref 1.5–4.5)
Potassium: 4.4 mmol/L (ref 3.5–5.2)
Sodium: 141 mmol/L (ref 134–144)
TOTAL PROTEIN: 6.6 g/dL (ref 6.0–8.5)

## 2017-07-19 LAB — CBC WITH DIFFERENTIAL/PLATELET
BASOS ABS: 0 10*3/uL (ref 0.0–0.2)
Basos: 0 %
EOS (ABSOLUTE): 0 10*3/uL (ref 0.0–0.4)
Eos: 1 %
Hematocrit: 41.2 % (ref 34.0–46.6)
Hemoglobin: 13.8 g/dL (ref 11.1–15.9)
IMMATURE GRANS (ABS): 0 10*3/uL (ref 0.0–0.1)
IMMATURE GRANULOCYTES: 0 %
LYMPHS: 31 %
Lymphocytes Absolute: 1.8 10*3/uL (ref 0.7–3.1)
MCH: 31.2 pg (ref 26.6–33.0)
MCHC: 33.5 g/dL (ref 31.5–35.7)
MCV: 93 fL (ref 79–97)
Monocytes Absolute: 0.6 10*3/uL (ref 0.1–0.9)
Monocytes: 10 %
NEUTROS PCT: 58 %
Neutrophils Absolute: 3.4 10*3/uL (ref 1.4–7.0)
PLATELETS: 291 10*3/uL (ref 150–379)
RBC: 4.42 x10E6/uL (ref 3.77–5.28)
RDW: 12.4 % (ref 12.3–15.4)
WBC: 5.8 10*3/uL (ref 3.4–10.8)

## 2017-07-19 LAB — TSH: TSH: 1.18 u[IU]/mL (ref 0.450–4.500)

## 2017-08-08 ENCOUNTER — Telehealth: Payer: Self-pay | Admitting: Family Medicine

## 2017-08-08 NOTE — Telephone Encounter (Signed)
error 

## 2017-08-08 NOTE — Telephone Encounter (Signed)
The patient stated she has asked multiple times for an itemized statement. I have printed it for her for dates in 2018. Mailed today.

## 2017-08-08 NOTE — Telephone Encounter (Signed)
Copied from CRM #[id number]. Topic: Quick Communication - See Telephone Encounter °>> Aug 08, 2017  3:33 PM Reid, Karysha J wrote: °CRM for notification. See Telephone encounter for:  08/08/17. Outbound call due to disconnection... Pt was calling to see if billing statements were ready for pick up... Spoke with FC who states no paperwork is ready for pick up and pt needs to send °the req. [facility].. Provided # [phone] °

## 2017-08-08 NOTE — Telephone Encounter (Signed)
Copied from CRM #[id number]. Topic: Quick Communication - See Telephone Encounter >> Aug 08, 2017  3:33 PM Lilia Pro wrote: CRM for notification. See Telephone encounter for:  08/08/17. Outbound call due to disconnection... Pt was calling to see if billing statements were ready for pick up... Spoke with FC who states no paperwork is ready for pick up and pt needs to send the req. [facility].. Provided # [phone]

## 2017-08-18 ENCOUNTER — Ambulatory Visit: Payer: Medicare HMO | Admitting: Family Medicine

## 2017-09-12 ENCOUNTER — Encounter: Payer: Medicare HMO | Admitting: Family Medicine

## 2017-09-13 ENCOUNTER — Telehealth: Payer: Self-pay | Admitting: Family Medicine

## 2017-09-13 NOTE — Telephone Encounter (Signed)
Patient's daughter is calling to notify provider that her mother is getting worse- she is calling people late /early in the morning, she had her door open the other day with her food packed up and pocketbook ready to go home and she was at home, she threw all her medication away- they can't find anything, she is drinking beer daily, she is loosing weight and not eating. They feel it is time to make a decision about her care. Daughter is concerned about her welfare. She doesn't even know what to do about her tonight.  Pam- [phone]

## 2017-09-13 NOTE — Telephone Encounter (Signed)
Phone call Discussed with patient's daughter patient drinking approximately 8 beers a day not taking any medications and having hallucinations.  Patient's very combative and belligerent not eating losing weight. Reviewed it sounds like she is not going to be with [place] this Christmas. Discussed various strategies for tapering down and discontinuing  Beer. Daughter doesn't feel that patient would be able to manage this with her assistance. Daughter will try to bring inpatient for an office visit.

## 2017-09-14 ENCOUNTER — Encounter: Payer: Self-pay | Admitting: Family Medicine

## 2017-09-14 ENCOUNTER — Ambulatory Visit: Payer: Medicare HMO | Admitting: Family Medicine

## 2017-09-14 ENCOUNTER — Telehealth: Payer: Self-pay | Admitting: Family Medicine

## 2017-09-14 VITALS — BP 134/80 | HR 82 | Temp 98.3°F | Wt 127.0 lb

## 2017-09-14 DIAGNOSIS — I10 Essential (primary) hypertension: Secondary | ICD-10-CM

## 2017-09-14 DIAGNOSIS — F0390 Unspecified dementia without behavioral disturbance: Secondary | ICD-10-CM | POA: Diagnosis not present

## 2017-09-14 DIAGNOSIS — F03A0 Unspecified dementia, mild, without behavioral disturbance, psychotic disturbance, mood disturbance, and anxiety: Secondary | ICD-10-CM

## 2017-09-14 MED ORDER — QUETIAPINE FUMARATE 25 MG PO TABS: ORAL_TABLET | ORAL | 1 refills | Status: DC

## 2017-09-14 MED ORDER — MIRTAZAPINE 30 MG PO TABS: 30.0000 mg | ORAL_TABLET | Freq: Every day | ORAL | 1 refills | Status: DC

## 2017-09-14 NOTE — Addendum Note (Signed)
Addended by: Roosvelt Maser E on: 09/14/2017 02:49 PM   Modules accepted: Orders

## 2017-09-14 NOTE — Telephone Encounter (Signed)
Copied from CRM [id number]. Topic: Quick Communication - See Telephone Encounter >> Sep 14, 2017 12:48 PM Oneal Grout wrote: CRM for notification. See Telephone encounter for:  Daughter calling, patient has lost her meds, requesting refill be called in for mirtazapine (REMERON) 30 MG tablet and QUEtiapine (SEROQUEL) 25 MG tablet. [facility] [place]

## 2017-09-14 NOTE — Progress Notes (Signed)
BP 134/80 (BP Location: Left Arm, Patient Position: Sitting, Cuff Size: Normal)   Pulse 82   Temp 98.3 F (36.8 C)   Wt 127 lb (57.6 kg)   LMP 05/20/1994 (Approximate) Comment: age 76  SpO2 99%   BMI 20.94 kg/m    Subjective:    Patient ID: Tara Huffman, female    DOB: 05/30/1941, 76 y.o.   MRN: [id number]  HPI: Tara Huffman is a 76 y.o. female  Chief Complaint  Patient presents with  . Altered Mental Status  Patient presents today with her daughter, who provides much of the hx. Shaking, hallucinating, abnormal behaviors around the house, paranoia worse the past week. Pt does have dementia. Daughter states her mother will not allow her to help with her medications but has not been taking anything for at least a month. Has been off BP medications for several months, does not want to get back on. States BPs WNL with no sxs. Supposed to be taking seroquel and remeron for sundowner's agitation, sleep disturbance, moods, and appetite.   Past Medical History:  Diagnosis Date  . Allergy   . Hyperlipidemia   . Hypertension   . Insomnia   . Insomnia    Social History   Socioeconomic History  . Marital status: Widowed    Spouse name: Not on file  . Number of children: Not on file  . Years of education: Not on file  . Highest education level: Not on file  Social Needs  . Financial resource strain: Not on file  . Food insecurity - worry: Not on file  . Food insecurity - inability: Not on file  . Transportation needs - medical: Not on file  . Transportation needs - non-medical: Not on file  Occupational History  . Not on file  Tobacco Use  . Smoking status: Never Smoker  . Smokeless tobacco: Never Used  Substance and Sexual Activity  . Alcohol use: No  . Drug use: No  . Sexual activity: Not on file  Other Topics Concern  . Not on file  Social History Narrative  . Not on file   Relevant past medical, surgical, family and social history reviewed and updated as  indicated. Interim medical history since our last visit reviewed. Allergies and medications reviewed and updated.  Review of Systems  Constitutional: Negative.   Respiratory: Negative.   Cardiovascular: Negative.   Gastrointestinal: Negative.   Musculoskeletal: Negative.   Skin: Negative.   Neurological: Negative.   Psychiatric/Behavioral: Positive for behavioral problems, confusion, hallucinations and sleep disturbance. The patient is nervous/anxious.    Per HPI unless specifically indicated above     Objective:    BP 134/80 (BP Location: Left Arm, Patient Position: Sitting, Cuff Size: Normal)   Pulse 82   Temp 98.3 F (36.8 C)   Wt 127 lb (57.6 kg)   LMP 05/20/1994 (Approximate) Comment: age 76  SpO2 99%   BMI 20.94 kg/m   Wt Readings from Last 3 Encounters:  09/14/17 127 lb (57.6 kg)  07/18/17 128 lb 2 oz (58.1 kg)  03/29/17 134 lb 8 oz (61 kg)    Physical Exam  Constitutional: She appears well-developed and well-nourished.  HENT:  Head: Atraumatic.  Eyes: Conjunctivae are normal. Pupils are equal, round, and reactive to light. No scleral icterus.  Neck: Normal range of motion. Neck supple.  Cardiovascular: Normal rate.  Pulmonary/Chest: Effort normal and breath sounds normal. No respiratory distress.  Musculoskeletal: Normal range of motion.  Neurological: She  is alert.  Skin: Skin is warm and dry.  Psychiatric: She has a normal mood and affect. Her behavior is normal.  Nursing note and vitals reviewed.   Results for orders placed or performed in visit on 07/18/17  CBC with Differential/Platelet  Result Value Ref Range   WBC 5.8 3.4 - 10.8 x10E3/uL   RBC 4.42 3.77 - 5.28 x10E6/uL   Hemoglobin 13.8 11.1 - 15.9 g/dL   Hematocrit 36.1 44.3 - 46.6 %   MCV 93 79 - 97 fL   MCH 31.2 26.6 - 33.0 pg   MCHC 33.5 31.5 - 35.7 g/dL   RDW 15.4 00.8 - 67.6 %   Platelets 291 150 - 379 x10E3/uL   Neutrophils 58 Not Estab. %   Lymphs 31 Not Estab. %   Monocytes 10 Not  Estab. %   Eos 1 Not Estab. %   Basos 0 Not Estab. %   Neutrophils Absolute 3.4 1.4 - 7.0 x10E3/uL   Lymphocytes Absolute 1.8 0.7 - 3.1 x10E3/uL   Monocytes Absolute 0.6 0.1 - 0.9 x10E3/uL   EOS (ABSOLUTE) 0.0 0.0 - 0.4 x10E3/uL   Basophils Absolute 0.0 0.0 - 0.2 x10E3/uL   Immature Granulocytes 0 Not Estab. %   Immature Grans (Abs) 0.0 0.0 - 0.1 x10E3/uL  Comprehensive metabolic panel  Result Value Ref Range   Glucose 128 (H) 65 - 99 mg/dL   BUN 11 8 - 27 mg/dL   Creatinine, Ser 1.95 0.57 - 1.00 mg/dL   GFR calc non Af Amer 67 >59 mL/min/1.73   GFR calc Af Amer 77 >59 mL/min/1.73   BUN/Creatinine Ratio 13 12 - 28   Sodium 141 134 - 144 mmol/L   Potassium 4.4 3.5 - 5.2 mmol/L   Chloride 105 96 - 106 mmol/L   CO2 26 20 - 29 mmol/L   Calcium 9.6 8.7 - 10.3 mg/dL   Total Protein 6.6 6.0 - 8.5 g/dL   Albumin 3.9 3.5 - 4.8 g/dL   Globulin, Total 2.7 1.5 - 4.5 g/dL   Albumin/Globulin Ratio 1.4 1.2 - 2.2   Bilirubin Total 0.5 0.0 - 1.2 mg/dL   Alkaline Phosphatase 60 39 - 117 IU/L   AST 24 0 - 40 IU/L   ALT 12 0 - 32 IU/L  Bayer DCA Hb A1c Waived  Result Value Ref Range   Bayer DCA Hb A1c Waived 4.9 <7.0 %  TSH  Result Value Ref Range   TSH 1.180 0.450 - 4.500 uIU/mL      Assessment & Plan:   Problem List Items Addressed This Visit      Cardiovascular and Mediastinum   Hypertension    Pt wanting to d/c atenolol. Vitals WNL today, will continue to monitor closely.         Nervous and Auditory   Mild dementia - Primary    Geriatric consult has been requested, awaiting that appt with Dr. Hollace Hayward. Discussed with pt and daughter that we cannot discuss changes to her regimen at this time as she has not been taking any of her medicines. Pt to get back on both remeron and seroquel and schedule Geriatrics consult and PCP f/u in 2-4 weeks to review results. Offered home health med mgmt services as well as any additional support services, pt and daughter decline at this time.             Follow up plan: Return in about 2 weeks (around 09/28/2017) for Consult with Dr. Hollace Hayward, as scheduled with PCP.

## 2017-09-14 NOTE — Telephone Encounter (Signed)
Resent medications  

## 2017-09-14 NOTE — Telephone Encounter (Signed)
See attached.   Pt lost her meds.

## 2017-09-14 NOTE — Telephone Encounter (Signed)
Copied from CRM #[id number]. Topic: Quick Communication - See Telephone Encounter °>> Sep 14, 2017 12:48 PM Sebastian, Jennifer S wrote: °CRM for notification. See Telephone encounter for:  °Daughter calling, patient has lost her meds, requesting refill be called in for mirtazapine (REMERON) 30 MG tablet and QUEtiapine (SEROQUEL) 25 MG tablet. [facility] [place] °

## 2017-09-16 NOTE — Assessment & Plan Note (Signed)
Pt wanting to d/c atenolol. Vitals WNL today, will continue to monitor closely.

## 2017-09-16 NOTE — Assessment & Plan Note (Signed)
Geriatric consult has been requested, awaiting that appt with Dr. Hollace Hayward. Discussed with pt and daughter that we cannot discuss changes to her regimen at this time as she has not been taking any of her medicines. Pt to get back on both remeron and seroquel and schedule Geriatrics consult and PCP f/u in 2-4 weeks to review results. Offered home health med mgmt services as well as any additional support services, pt and daughter decline at this time.

## 2017-09-16 NOTE — Patient Instructions (Signed)
Follow up in 1 weeks.

## 2017-09-30 ENCOUNTER — Ambulatory Visit: Payer: Medicare HMO | Admitting: Family Medicine

## 2017-10-06 ENCOUNTER — Ambulatory Visit: Payer: Medicare HMO | Admitting: Family Medicine

## 2017-10-17 ENCOUNTER — Ambulatory Visit: Payer: Medicare HMO | Admitting: Family Medicine

## 2017-10-28 ENCOUNTER — Other Ambulatory Visit: Payer: Self-pay | Admitting: Family Medicine

## 2018-01-09 ENCOUNTER — Ambulatory Visit: Payer: Medicare HMO | Admitting: Family Medicine

## 2018-02-12 ENCOUNTER — Other Ambulatory Visit: Payer: Self-pay | Admitting: Family Medicine

## 2018-02-13 NOTE — Telephone Encounter (Signed)
Needs an appointment with PCP. Will get her enough medicine to make it to appointment when it's booked.

## 2018-03-02 ENCOUNTER — Telehealth: Payer: Self-pay

## 2018-03-02 NOTE — Telephone Encounter (Signed)
appt scheduled by [facility], routing to close.

## 2018-03-02 NOTE — Telephone Encounter (Signed)
Copied from CRM [id number]. Topic: Medicare AWV >> Mar 02, 2018  2:24 PM [place], [place] A, LPN wrote: Reason for CRM: Called to schedule medicare annual wellness visit with NHA- Sevrin Sally,LPN at [facility]. Please schedule anytime. Any questions please contact Bedelia Person on skype or by phone- [phone]

## 2018-03-16 ENCOUNTER — Ambulatory Visit: Payer: Medicare HMO | Admitting: Family Medicine

## 2018-03-20 ENCOUNTER — Ambulatory Visit: Payer: Medicare HMO | Admitting: Family Medicine

## 2018-04-24 ENCOUNTER — Ambulatory Visit: Payer: Medicare HMO | Admitting: Family Medicine

## 2018-04-27 ENCOUNTER — Other Ambulatory Visit: Payer: Self-pay | Admitting: Family Medicine

## 2018-04-27 NOTE — Telephone Encounter (Signed)
Your patient 

## 2018-06-14 ENCOUNTER — Ambulatory Visit: Payer: Medicare HMO

## 2018-06-19 ENCOUNTER — Other Ambulatory Visit: Payer: Self-pay | Admitting: Unknown Physician Specialty

## 2018-07-03 ENCOUNTER — Ambulatory Visit: Payer: Medicare HMO | Admitting: Family Medicine

## 2018-07-03 ENCOUNTER — Ambulatory Visit: Payer: Medicare HMO

## 2018-07-07 ENCOUNTER — Ambulatory Visit: Payer: Medicare HMO | Admitting: Family Medicine

## 2018-07-24 ENCOUNTER — Encounter

## 2018-07-24 ENCOUNTER — Ambulatory Visit: Payer: Medicare HMO | Admitting: Family Medicine

## 2018-07-31 ENCOUNTER — Ambulatory Visit: Payer: Medicare HMO

## 2018-08-02 ENCOUNTER — Other Ambulatory Visit: Payer: Self-pay | Admitting: Family Medicine

## 2018-08-02 NOTE — Telephone Encounter (Signed)
Requested medication (s) are due for refill today: Yes  Requested medication (s) are on the active medication list: Yes  Last refill:  03/02/18  Future visit scheduled: Yes  Notes to clinic:  Unable to refill per protocol     Requested Prescriptions  Pending Prescriptions Disp Refills   mirtazapine (REMERON) 30 MG tablet 90 tablet 0    Sig: TAKE 1 TABLET BY MOUTH AT BEDTIME FOR SLEEP AND APPETITE     Psychiatry: Antidepressants - mirtazapine Failed - 08/02/2018  5:45 PM      Failed - AST in normal range and within 360 days    AST  Date Value Ref Range Status  07/18/2017 24 0 - 40 IU/L Final         Failed - ALT in normal range and within 360 days    ALT  Date Value Ref Range Status  07/18/2017 12 0 - 32 IU/L Final         Failed - Triglycerides in normal range and within 360 days    Triglycerides  Date Value Ref Range Status  03/29/2017 83 0 - 149 mg/dL Final         Failed - Total Cholesterol in normal range and within 360 days    Cholesterol, Total  Date Value Ref Range Status  03/29/2017 197 100 - 199 mg/dL Final         Failed - WBC in normal range and within 360 days    WBC  Date Value Ref Range Status  07/18/2017 5.8 3.4 - 10.8 x10E3/uL Final  12/06/2015 5.8 3.6 - 11.0 K/uL Final         Failed - Valid encounter within last 6 months    Recent Outpatient Visits          10 months ago Mild dementia   [facility] Particia Nearing, PA-C   1 year ago Weight loss   [facility] [place], [place], DO   1 year ago Essential hypertension   [facility] [place], [place], DO   1 year ago Mild dementia   [facility] Crissman, Redge Gainer, MD   2 years ago Mixed hyperlipidemia   [facility] [place], [place], DO      Future Appointments            In 2 weeks Cr[place], Redge Gainer, MD [facility], [facility]

## 2018-08-02 NOTE — Telephone Encounter (Signed)
Copied from CRM [id number]. Topic: Quick Communication - Rx Refill/Question >> Aug 02, 2018  5:17 PM Mickel Baas B, NT wrote: **Patient states that she needs more of her sleeping pill. Unsure of the name of the medication at this time. States that it was prescribed by Dr Laural Benes.**  Medication: mirtazapine (REMERON) 30 MG tablet  Has the patient contacted their pharmacy? Yes.   (Agent: If no, request that the patient contact the pharmacy for the refill.) (Agent: If yes, when and what did the pharmacy advise?)  Preferred Pharmacy (with phone number or street name): [id number] [place], [place] - [address]  Agent: Please be advised that RX refills may take up to 3 business days. We ask that you follow-up with your pharmacy.

## 2018-08-03 MED ORDER — MIRTAZAPINE 30 MG PO TABS: ORAL_TABLET | ORAL | 0 refills | Status: DC

## 2018-08-21 ENCOUNTER — Other Ambulatory Visit: Payer: Self-pay | Admitting: Unknown Physician Specialty

## 2018-08-21 ENCOUNTER — Ambulatory Visit: Payer: Medicare HMO | Admitting: Family Medicine

## 2018-08-23 ENCOUNTER — Telehealth: Payer: Self-pay | Admitting: Family Medicine

## 2018-08-23 ENCOUNTER — Other Ambulatory Visit: Payer: Self-pay | Admitting: Unknown Physician Specialty

## 2018-08-23 NOTE — Telephone Encounter (Signed)
Requested medication (s) are due for refill today: yes  Requested medication (s) are on the active medication list:No  Last refill:  05/04/16  Expired  Future visit scheduled: NO  Notes to clinic: expired    Requested Prescriptions  Pending Prescriptions Disp Refills   fluticasone (FLONASE) 50 MCG/ACT nasal spray [Pharmacy Med Name: FLUTICASONE PROP 50 MCG SPRAY]  4    Sig: SPRAY 2 SPRAY IN EACH NOSTRIL DAILY     Ear, Nose, and Throat: Nasal Preparations - Corticosteroids Passed - 08/23/2018  5:08 PM      Passed - Valid encounter within last 12 months    Recent Outpatient Visits          11 months ago Mild dementia   [facility] Particia Nearing, [place]   1 year ago Weight loss   [facility] [place], [place], DO   1 year ago Essential hypertension   [facility] [place], [place], DO   1 year ago Mild dementia   [facility] Crissman, Redge Gainer, MD   2 years ago Mixed hyperlipidemia   [facility] [place], [place], DO

## 2018-08-23 NOTE — Telephone Encounter (Signed)
Requested Prescriptions  Refused Prescriptions Disp Refills  . mirtazapine (REMERON) 30 MG tablet [Pharmacy Med Name: MIRTAZAPINE 30 MG TABLET] 90 tablet 0    Sig: TAKE 1 TABLET BY MOUTH AT BEDTIME FOR SLEEP AND APPETITE     Psychiatry: Antidepressants - mirtazapine Failed - 08/23/2018  4:47 PM      Failed - AST in normal range and within 360 days    AST  Date Value Ref Range Status  07/18/2017 24 0 - 40 IU/L Final         Failed - ALT in normal range and within 360 days    ALT  Date Value Ref Range Status  07/18/2017 12 0 - 32 IU/L Final         Failed - Triglycerides in normal range and within 360 days    Triglycerides  Date Value Ref Range Status  03/29/2017 83 0 - 149 mg/dL Final         Failed - Total Cholesterol in normal range and within 360 days    Cholesterol, Total  Date Value Ref Range Status  03/29/2017 197 100 - 199 mg/dL Final         Failed - WBC in normal range and within 360 days    WBC  Date Value Ref Range Status  07/18/2017 5.8 3.4 - 10.8 x10E3/uL Final  12/06/2015 5.8 3.6 - 11.0 K/uL Final         Failed - Valid encounter within last 6 months    Recent Outpatient Visits          11 months ago Mild dementia   [facility] Particia Nearing, PA-C   1 year ago Weight loss   [facility] [place], [place], DO   1 year ago Essential hypertension   [facility] [place], [place], DO   1 year ago Mild dementia   [facility] [place], Redge Gainer, MD   2 years ago Mixed hyperlipidemia   [facility] [place], [place], DO

## 2018-08-24 NOTE — Telephone Encounter (Signed)
Refill request approved for Flonase per patient request.

## 2018-08-31 ENCOUNTER — Other Ambulatory Visit: Payer: Self-pay

## 2018-09-01 MED ORDER — MIRTAZAPINE 30 MG PO TABS: ORAL_TABLET | ORAL | 0 refills | Status: DC

## 2018-10-17 ENCOUNTER — Ambulatory Visit: Payer: Self-pay | Admitting: Nurse Practitioner

## 2018-10-27 ENCOUNTER — Ambulatory Visit: Payer: Medicare HMO | Admitting: Nurse Practitioner

## 2018-10-27 ENCOUNTER — Encounter: Payer: Self-pay | Admitting: Nurse Practitioner

## 2018-10-27 ENCOUNTER — Ambulatory Visit (INDEPENDENT_AMBULATORY_CARE_PROVIDER_SITE_OTHER): Payer: Medicare HMO | Admitting: Nurse Practitioner

## 2018-10-27 VITALS — BP 125/88 | HR 85 | Temp 98.0°F | Ht 66.0 in | Wt 121.0 lb

## 2018-10-27 DIAGNOSIS — F0390 Unspecified dementia without behavioral disturbance: Secondary | ICD-10-CM | POA: Diagnosis not present

## 2018-10-27 DIAGNOSIS — E538 Deficiency of other specified B group vitamins: Secondary | ICD-10-CM | POA: Diagnosis not present

## 2018-10-27 DIAGNOSIS — R4182 Altered mental status, unspecified: Secondary | ICD-10-CM | POA: Insufficient documentation

## 2018-10-27 DIAGNOSIS — R259 Unspecified abnormal involuntary movements: Secondary | ICD-10-CM | POA: Diagnosis not present

## 2018-10-27 DIAGNOSIS — R443 Hallucinations, unspecified: Secondary | ICD-10-CM

## 2018-10-27 DIAGNOSIS — Z7189 Other specified counseling: Secondary | ICD-10-CM

## 2018-10-27 DIAGNOSIS — F03A0 Unspecified dementia, mild, without behavioral disturbance, psychotic disturbance, mood disturbance, and anxiety: Secondary | ICD-10-CM

## 2018-10-27 LAB — MICROSCOPIC EXAMINATION
Bacteria, UA: NONE SEEN
RBC, UA: NONE SEEN /hpf (ref 0–2)

## 2018-10-27 LAB — UA/M W/RFLX CULTURE, ROUTINE
Bilirubin, UA: NEGATIVE
Glucose, UA: NEGATIVE
Nitrite, UA: NEGATIVE
PH UA: 5.5 (ref 5.0–7.5)
SPEC GRAV UA: 1.025 (ref 1.005–1.030)
Urobilinogen, Ur: 0.2 mg/dL (ref 0.2–1.0)

## 2018-10-27 MED ORDER — ATENOLOL 50 MG PO TABS: 50.0000 mg | ORAL_TABLET | Freq: Every day | ORAL | 2 refills | Status: DC

## 2018-10-27 MED ORDER — MIRTAZAPINE 30 MG PO TABS: ORAL_TABLET | ORAL | 0 refills | Status: DC

## 2018-10-27 MED ORDER — CEFPODOXIME PROXETIL 100 MG PO TABS: 100.0000 mg | ORAL_TABLET | Freq: Two times a day (BID) | ORAL | 0 refills | Status: AC

## 2018-10-27 MED ORDER — QUETIAPINE FUMARATE 25 MG PO TABS: ORAL_TABLET | ORAL | 1 refills | Status: DC

## 2018-10-27 MED ORDER — DONEPEZIL HCL 10 MG PO TABS: ORAL_TABLET | ORAL | 3 refills | Status: DC

## 2018-10-27 NOTE — Assessment & Plan Note (Signed)
MMSE 19/30 today.  Refill on Seroquel, Remeron, and Donepezil sent.  Discussed with patient and family possible placement vs in home health assistance.  They wish to further discuss and tour some local facilities.  Had at length conversation with family and patient about selecting an HCPOA, whom patient feels will be son-in-law.  Provided various books and educational links to review that will assist.  Will continue ongoing education to patient and family.  Referral to neurology to ensure continued follow-up.
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
## 2018-10-27 NOTE — Patient Instructions (Signed)
Dementia Caregiver Guide Dementia is a term used to describe a number of symptoms that affect memory and thinking. The most common symptoms include:  Memory loss.  Trouble with language and communication.  Trouble concentrating.  Poor judgment.  Problems with reasoning.  Child-like behavior and language.  Extreme anxiety.  Angry outbursts.  Wandering from home or public places. Dementia usually gets worse slowly over time. In the early stages, people with dementia can stay independent and safe with some help. In later stages, they need help with daily tasks such as dressing, grooming, and using the bathroom. How to help the person with dementia cope Dementia can be frightening and confusing. Here are some tips to help the person with dementia cope with changes caused by the disease. General tips  Keep the person on track with his or her routine.  Try to identify areas where the person may need help.  Be supportive, patient, calm, and encouraging.  Gently remind the person that adjusting to changes takes time.  Help with the tasks that the person has asked for help with.  Keep the person involved in daily tasks and decisions as much as possible.  Encourage conversation, but try not to get frustrated or harried if the person struggles to find words or does not seem to appreciate your help. Communication tips  When the person is talking or seems frustrated, make eye contact and hold the person's hand.  Ask specific questions that need yes or no answers.  Use simple words, short sentences, and a calm voice. Only give one direction at a time.  When offering choices, limit them to just 1 or 2.  Avoid correcting the person in a negative way.  If the person is struggling to find the right words, gently try to help him or her. How to recognize symptoms of stress Symptoms of stress in caregivers include:  Feeling frustrated or angry with the person with dementia.   Denying that the person has dementia or that his or her symptoms will not improve.  Feeling hopeless and unappreciated.  Difficulty sleeping.  Difficulty concentrating.  Feeling anxious, irritable, or depressed.  Developing stress-related health problems.  Feeling like you have too little time for your own life. Follow these instructions at home:   Make sure that you and the person you are caring for: ? Get regular sleep. ? Exercise regularly. ? Eat regular, nutritious meals. ? Drink enough fluid to keep your urine clear or pale yellow. ? Take over-the-counter and prescription medicines only as told by your health care providers. ? Attend all scheduled health care appointments.  Join a support group with others who are caregivers.  Ask about respite care resources so that you can have a regular break from the stress of caregiving.  Look for signs of stress in yourself and in the person you are caring for. If you notice signs of stress, take steps to manage it.  Consider any safety risks and take steps to avoid them.  Organize medications in a pill box for each day of the week.  Create a plan to handle any legal or financial matters. Get legal or financial advice if needed.  Keep a calendar in a central location to remind the person of appointments or other activities. Tips for reducing the risk of injury  Keep floors clear of clutter. Remove rugs, magazine racks, and floor lamps.  Keep hallways well lit, especially at night.  Put a handrail and nonslip mat in the bathtub or   shower.  Put childproof locks on cabinets that contain dangerous items, such as medicines, alcohol, guns, toxic cleaning items, sharp tools or utensils, matches, and lighters.  Put the locks in places where the person cannot see or reach them easily. This will help ensure that the person does not wander out of the house and get lost.  Be prepared for emergencies. Keep a list of emergency phone  numbers and addresses in a convenient area.  Remove car keys and lock garage doors so that the person does not try to get in the car and drive.  Have the person wear a bracelet that tracks locations and identifies the person as having memory problems. This should be worn at all times for safety. Where to find support: Many individuals and organizations offer support. These include:  Support groups for people with dementia and for caregivers.  Counselors or therapists.  Home health care services.  Adult day care centers. Where to find more information Alzheimer's Association: www.alz.org Contact a health care provider if:  The person's health is rapidly getting worse.  You are no longer able to care for the person.  Caring for the person is affecting your physical and emotional health.  The person threatens himself or herself, you, or anyone else. Summary  Dementia is a term used to describe a number of symptoms that affect memory and thinking.  Dementia usually gets worse slowly over time.  Take steps to reduce the person's risk of injury, and to plan for future care.  Caregivers need support, relief from caregiving, and time for their own lives. This information is not intended to replace advice given to you by your health care provider. Make sure you discuss any questions you have with your health care provider. Document Released: 09/07/2016 Document Revised: 09/07/2016 Document Reviewed: 09/07/2016 Elsevier Interactive Patient Education  2019 Elsevier Inc.  

## 2018-10-27 NOTE — Assessment & Plan Note (Signed)
Urine returned noting 1+ Ket, trace Leu, trace PRT, and 1+ blood.  Will send for culture.  At this time will treat with Vantin 100 MG twice a day x 5 days.  Refills on Seroquel, Mirtazapine, and Donepezil sent.  Recommended increase hydration at home (water) and decrease alcohol intake.  Altered mental status: UTI vs poor med compliance with dementia vs poor oral fluid intake & daily alcohol intake.  CBC, CMP, anemia panel, TSH, and B12 level labs drawn.

## 2018-10-27 NOTE — Assessment & Plan Note (Signed)
Refill on Seroquel sent.  Have sent referral to neurology as has not been seen in 2 years and would benefit from further follow-up and assessment.  Consider geri-psych referral in future.

## 2018-10-27 NOTE — Assessment & Plan Note (Signed)
Mild upper body and extremity tremor.  Referral sent to get patient back into neurology as has not been seen in 2 years and would benefit from further work-up and evaluation.  Patient and family in agreeance with plan.  TSH and B12 level today.

## 2018-10-27 NOTE — Assessment & Plan Note (Signed)
Discussion on HCPOA with patient and family.  Provided copy of Hard Choices for Loving People by Glory Rosebush to family for review with patient.  Continue ongoing discussions.

## 2018-10-30 ENCOUNTER — Encounter: Payer: Self-pay | Admitting: Nurse Practitioner

## 2018-10-30 LAB — ANEMIA PANEL
FERRITIN: 233 ng/mL — AB (ref 15–150)
Folate, Hemolysate: 529 ng/mL
Folate, RBC: 1281 ng/mL (ref >498)
Hematocrit: 41.3 % (ref 34.0–46.6)
Iron Saturation: 36 % (ref 15–55)
Iron: 94 ug/dL (ref 27–139)
Retic Ct Pct: 1.1 % (ref 0.6–2.6)
Total Iron Binding Capacity: 260 ug/dL (ref 250–450)
UIBC: 166 ug/dL (ref 118–369)
Vitamin B-12: 309 pg/mL (ref 232–1245)

## 2018-10-30 LAB — COMPREHENSIVE METABOLIC PANEL
ALT: 10 IU/L (ref 0–32)
AST: 22 IU/L (ref 0–40)
Albumin/Globulin Ratio: 1.7 (ref 1.2–2.2)
Albumin: 4.5 g/dL (ref 3.5–4.8)
Alkaline Phosphatase: 59 IU/L (ref 39–117)
BUN/Creatinine Ratio: 15 (ref 12–28)
BUN: 17 mg/dL (ref 8–27)
Bilirubin Total: 0.9 mg/dL (ref 0.0–1.2)
CO2: 18 mmol/L — AB (ref 20–29)
CREATININE: 1.11 mg/dL — AB (ref 0.57–1.00)
Calcium: 9.8 mg/dL (ref 8.7–10.3)
Chloride: 101 mmol/L (ref 96–106)
GFR calc Af Amer: 55 mL/min/{1.73_m2} — ABNORMAL LOW (ref >59)
GFR, EST NON AFRICAN AMERICAN: 48 mL/min/{1.73_m2} — AB (ref >59)
Globulin, Total: 2.6 g/dL (ref 1.5–4.5)
Glucose: 120 mg/dL — ABNORMAL HIGH (ref 65–99)
Potassium: 4.3 mmol/L (ref 3.5–5.2)
Sodium: 138 mmol/L (ref 134–144)
Total Protein: 7.1 g/dL (ref 6.0–8.5)

## 2018-10-30 LAB — TSH: TSH: 1.28 u[IU]/mL (ref 0.450–4.500)

## 2018-10-30 NOTE — Progress Notes (Signed)
Letter written to be sent.  Have recommend starting Vitamin B12 daily and increasing daily oral fluid intake, as concern for some dehydration possibly related to dementia.

## 2018-11-01 LAB — CBC WITH DIFFERENTIAL/PLATELET

## 2018-11-02 ENCOUNTER — Other Ambulatory Visit: Payer: Self-pay | Admitting: Nurse Practitioner

## 2018-11-02 ENCOUNTER — Telehealth: Payer: Self-pay | Admitting: Family Medicine

## 2018-11-02 MED ORDER — AMOXICILLIN-POT CLAVULANATE 875-125 MG PO TABS: 1.0000 | ORAL_TABLET | Freq: Two times a day (BID) | ORAL | 0 refills | Status: AC

## 2018-11-02 NOTE — Telephone Encounter (Signed)
Tara Huffman, I am turning this 1 over to you :)

## 2018-11-02 NOTE — Progress Notes (Signed)
Pt family requesting different abx due to cost.  Will send in Augmentin.

## 2018-11-02 NOTE — Telephone Encounter (Signed)
Thanks Got it.

## 2018-11-02 NOTE — Telephone Encounter (Signed)
Copied from CRM [id number]. Topic: Quick Communication - See Telephone Encounter >> Nov 02, 2018  4:08 PM Jens Som A wrote: CRM for notification. See Telephone encounter for: 11/02/18.  Patient daughter is calling because the patient was given an antibiotic last week that was $40.  That is a bit much for the patient.  The patient daughter is requesting another antibiotic that is a bit cheaper. Please advise.[id number] - [place], [place] - [address] [phone] (Phone) [phone] (Fax)

## 2018-11-19 ENCOUNTER — Other Ambulatory Visit: Payer: Self-pay | Admitting: Nurse Practitioner

## 2018-11-20 NOTE — Telephone Encounter (Signed)
Pharmacy is requesting changes to prescription- sent for review.

## 2018-11-27 ENCOUNTER — Other Ambulatory Visit: Payer: Self-pay | Admitting: Nurse Practitioner

## 2018-11-29 ENCOUNTER — Ambulatory Visit: Payer: Medicare HMO | Admitting: Nurse Practitioner

## 2018-12-03 ENCOUNTER — Other Ambulatory Visit: Payer: Self-pay | Admitting: Nurse Practitioner

## 2018-12-04 NOTE — Telephone Encounter (Addendum)
P.A. initiated via cover my meds. Medication was approved. KEYTheadora Rama   PA Case: [id number], Status: Approved, Coverage Starts on: 12/04/2018 12:00:00 AM, Coverage Ends on: 10/18/2019 12:00:00 AM. Questions? Contact [phone].

## 2018-12-04 NOTE — Telephone Encounter (Signed)
Refill request for Seroquel approved.  PA has been approved for medication.

## 2018-12-04 NOTE — Telephone Encounter (Signed)
Can we see which alternative is recommended by insurance or can we do PA for this.  Thank you.

## 2018-12-06 ENCOUNTER — Ambulatory Visit: Payer: Medicare HMO | Admitting: Nurse Practitioner

## 2018-12-11 ENCOUNTER — Other Ambulatory Visit: Payer: Self-pay | Admitting: Nurse Practitioner

## 2018-12-11 NOTE — Telephone Encounter (Signed)
Requested medication (s) are due for refill today: no  Requested medication (s) are on the active medication list: yes  Last refill:  12/04/18 #30 6 RF  Future visit scheduled: no  Notes to clinic:  Pt wants 90 day refill   Requested Prescriptions  Pending Prescriptions Disp Refills   QUEtiapine (SEROQUEL) 25 MG tablet [Pharmacy Med Name: QUETIAPINE FUMARATE 25 MG TAB] 90 tablet 3    Sig: TAKE 1 TABLET (25 MG TOTAL) BY MOUTH AT BEDTIME.     Not Delegated - Psychiatry:  Antipsychotics - Second Generation (Atypical) - quetiapine Failed - 12/11/2018  8:45 AM      Failed - This refill cannot be delegated      Passed - ALT in normal range and within 180 days    ALT  Date Value Ref Range Status  10/27/2018 10 0 - 32 IU/L Final         Passed - AST in normal range and within 180 days    AST  Date Value Ref Range Status  10/27/2018 22 0 - 40 IU/L Final         Passed - Last BP in normal range    BP Readings from Last 1 Encounters:  10/27/18 125/88         Passed - Valid encounter within last 6 months    Recent Outpatient Visits          1 month ago Altered mental status, unspecified altered mental status type   [facility] Marjie Skiff, NP   1 year ago Mild dementia   [facility] Particia Nearing, [place]   1 year ago Weight loss   [facility] [place], [place], DO   1 year ago Essential hypertension   [facility] [place], [place], DO   1 year ago Mild dementia   [facility] [place], Redge Gainer, MD

## 2018-12-11 NOTE — Telephone Encounter (Signed)
Refill request for 90 day supply sent.

## 2018-12-19 ENCOUNTER — Encounter: Payer: Self-pay | Admitting: *Deleted

## 2018-12-19 ENCOUNTER — Ambulatory Visit: Payer: Self-pay | Admitting: *Deleted

## 2018-12-19 DIAGNOSIS — R259 Unspecified abnormal involuntary movements: Secondary | ICD-10-CM

## 2018-12-19 DIAGNOSIS — F0390 Unspecified dementia without behavioral disturbance: Secondary | ICD-10-CM

## 2018-12-19 DIAGNOSIS — I10 Essential (primary) hypertension: Secondary | ICD-10-CM

## 2018-12-19 DIAGNOSIS — F03A0 Unspecified dementia, mild, without behavioral disturbance, psychotic disturbance, mood disturbance, and anxiety: Secondary | ICD-10-CM

## 2018-12-19 DIAGNOSIS — E782 Mixed hyperlipidemia: Secondary | ICD-10-CM

## 2018-12-19 NOTE — Patient Instructions (Signed)
Visit Information  Goals Addressed    . Advanced Care Planning needs       Current Barriers:  . Lacks caregiver support.  . Cognitive Deficits  Nurse Case Manager Clinical Goal(s):  Marland Kitchen Over the next 30 days, patient will verbalize understanding of plan for completion of advanced directives  Interventions:  . Evaluation of current treatment plan related to advanced directives  . Collaborated with LCSW and care management team regarding advanced directives . Discussed plans with patient for ongoing care management follow up and provided patient with direct contact information for care management team  Patient Self Care Activities:  . Performs ADL's independently  Plan:  . RNCM will follow up with LCSW re: Advanced Directives   Initial goal documentation     . Appropriate Level of Care Needs will be addressed       Current Barriers:  . Lacks caregiver support - patient referred because of concerns related to possible social isolation; patient confirms that she lives alone but has daughter living nearby who she can call on if she needs anything . Cognitive Deficits - patient has diagnosis of "moderate" dementia; patient able to carry on conversation/answer questions appropriately and take down my contact information today  Nurse Case Manager Clinical Goal(s):  Marland Kitchen Over the next 30 days, patient/family will verbalize understanding of plan for addressing level of care concerns  Interventions:  . Evaluation of current treatment plan related to dementia and patient's adherence to plan as established by provider. Marland Kitchen Collaborated with LCSW regarding dementia, home safety, and level of care concerns . Discussed plans with patient for ongoing care management follow up and provided patient with direct contact information for care management team - with patient's verbal permission, attempted outreach to daughter but unsuccessful  Patient Self Care Activities:  . Performs ADL's  independently  Plan:  . RNCM will collaborate with LCSW and care management team re: ongoing assessment and interventions needed to address level of care concerns and will follow up with patient and daughter by phone   Initial goal documentation        Education or Materials Provided:  . Verbal education about CM services provided by phone  Ms. Rodenberg was given information about Chronic Care Management services today including:  1. CCM service includes personalized support from designated clinical staff supervised by her physician, including individualized plan of care and coordination with other care providers 2. 24/7 contact phone numbers for assistance for urgent and routine care needs. 3. Service will only be billed when office clinical staff spend 20 minutes or more in a month to coordinate care. 4. Only one practitioner may furnish and bill the service in a calendar month. 5. The patient may stop CCM services at any time (effective at the end of the month) by phone call to the office staff. 6. The patient will be responsible for cost sharing (co-pay) of up to 20% of the service fee (after annual deductible is met).  Patient agreed to services and verbal consent obtained.   The patient verbalized understanding of instructions provided today and declined a print copy of patient instruction materials.   Telephone follow up appointment with social worker scheduled for: 12/26/18   Janalyn Shy MHA,BSN,RN,CCM Nurse Care Coordinator [facility] Care Management [phone]

## 2018-12-19 NOTE — Chronic Care Management (AMB) (Signed)
Care Management Note   Tara Huffman is a 78 y.o. year old female is a primary care patient of Crissman, Jeannette How, MD. The CM team was consulted for assistance with chronic disease management and care coordination needs.   I reached out to Tara Huffman by phone today.   Tara Huffman was given information about Chronic Care Management services today including:  1. CCM service includes personalized support from designated clinical staff supervised by her physician, including individualized plan of care and coordination with other care providers 2. 24/7 contact phone numbers for assistance for urgent and routine care needs. 3. Service will only be billed when office clinical staff spend 20 minutes or more in a month to coordinate care. 4. Only one practitioner may furnish and bill the service in a calendar month. 5. The patient may stop CCM services at any time (effective at the end of the month) by phone call to the office staff. 6. The patient will be responsible for cost sharing (co-pay) of up to 20% of the service fee (after annual deductible is met). Patient agreed to services and verbal consent obtained.    Review of patient status, including review of consultants reports, relevant laboratory and other test results, and collaboration with appropriate care team members and the patient's provider was performed as part of comprehensive patient evaluation and provision of chronic care management services.  Goals Addressed    . Advanced Care Planning needs       Current Barriers:  . Lacks caregiver support.  . Cognitive Deficits  Nurse Case Manager Clinical Goal(s):  Marland Kitchen Over the next 30 days, patient will verbalize understanding of plan for completion of advanced directives  Interventions:  . Evaluation of current treatment plan related to advanced directives  . Collaborated with Tara Huffman and care management team regarding advanced directives . Discussed plans with patient for ongoing  care management follow up and provided patient with direct contact information for care management team  Patient Self Care Activities:  . Performs ADL's independently  Plan:  . RNCM will follow up with Tara Huffman re: Advanced Directives   Initial goal documentation     . Appropriate Level of Care Needs will be addressed       Current Barriers:  . Lacks caregiver support - patient referred because of concerns related to possible social isolation; patient confirms that she lives alone but has daughter living nearby who she can call on if she needs anything . Cognitive Deficits - patient has diagnosis of "moderate" dementia; patient able to carry on conversation/answer questions appropriately and take down my contact information today  Nurse Case Manager Clinical Goal(s):  Marland Kitchen Over the next 30 days, patient/family will verbalize understanding of plan for addressing level of care concerns  Interventions:  . Evaluation of current treatment plan related to dementia and patient's adherence to plan as established by provider. Marland Kitchen Collaborated with Tara Huffman regarding dementia, home safety, and level of care concerns . Discussed plans with patient for ongoing care management follow up and provided patient with direct contact information for care management team - with patient's verbal permission, attempted outreach to daughter but unsuccessful  Patient Self Care Activities:  . Performs ADL's independently  Plan:  . RNCM will collaborate with Tara Huffman and care management team re: ongoing assessment and interventions needed to address level of care concerns and will follow up with patient and daughter by phone   Initial goal documentation      Follow Up Plan: Telephone  follow up appointment with Tara Huffman scheduled for: 12/26/18.   Tara Huffman MHA,BSN,RN,CCM Nurse Care Coordinator [facility] / [facility] Care Management [phone]

## 2018-12-26 ENCOUNTER — Telehealth: Payer: Self-pay

## 2019-01-01 ENCOUNTER — Telehealth: Payer: Self-pay

## 2019-01-03 ENCOUNTER — Ambulatory Visit (INDEPENDENT_AMBULATORY_CARE_PROVIDER_SITE_OTHER): Payer: Medicare HMO | Admitting: Licensed Clinical Social Worker

## 2019-01-03 ENCOUNTER — Other Ambulatory Visit: Payer: Self-pay

## 2019-01-03 DIAGNOSIS — R4182 Altered mental status, unspecified: Secondary | ICD-10-CM

## 2019-01-03 DIAGNOSIS — F03A0 Unspecified dementia, mild, without behavioral disturbance, psychotic disturbance, mood disturbance, and anxiety: Secondary | ICD-10-CM

## 2019-01-03 DIAGNOSIS — F0390 Unspecified dementia without behavioral disturbance: Secondary | ICD-10-CM | POA: Diagnosis not present

## 2019-01-03 DIAGNOSIS — Z7189 Other specified counseling: Secondary | ICD-10-CM

## 2019-01-03 NOTE — Patient Instructions (Signed)
Licensed Clinical Social Worker Visit Information  Goals we discussed today:  Goals Addressed    . COMPLETED: Advanced Care Planning needs       Current Barriers:  . Lacks caregiver support.  . Cognitive Deficits  Case Manager Clinical Goal(s):  Marland Kitchen Over the next 30 days, patient will verbalize understanding of plan for completion of advanced directives  Interventions:  . Evaluation of current treatment plan related to advanced directives  . Collaborated with RNCM regarding advanced directives needs/current status . Discussed plans with patient for ongoing care management follow up and provided patient with direct contact information for care management team . LCSW spoke with both patient and daughter and family confirmed to LCSW that they already have a POA which includes healthcare. LCSW offered a face to face visit with patient in order to help assist her with advance directive planning and education but family declined as they do not wish to make any changes to their current POA. Daughter has agreed to make a copy and bring to PCP's office.   Patient Self Care Activities:  . Attends all scheduled provider appointments . Performs ADL's independently  Plan:  . Patient will have daughter bring copy of POA to PCP office . LCSW willbe available to patient/family as needed   Please see past updates related to this goal by clicking on the "Past Updates" button in the selected goal      . COMPLETED: Appropriate Level of Care Needs will be addressed       Current Barriers:  . Lacks caregiver support - patient referred because of concerns related to possible social isolation; patient confirms that she lives alone but has daughter living nearby who she can call on if she needs anything . Cognitive Deficits - patient has diagnosis of "moderate" dementia; patient able to carry on conversation/answer questions appropriately and take down my contact information today  Case Manager Clinical  Goal(s):  Marland Kitchen Over the next 30 days, patient/family will verbalize understanding of plan for addressing level of care concerns  Interventions:  . Evaluation of current treatment plan related to dementia and patient's adherence to plan as established by provider. Marland Kitchen Collaborated with LCSW regarding dementia, home safety, and level of care concerns . Discussed plans with patient for ongoing care management follow up and provided patient with direct contact information for care management team - Family will contact LCSW if any future social work needs arise.  -Update: Patient and family do not wish to make any changes to patient's current level of care at this time but are appreciative of LCSW contacting them to provide educate on CCM program services.   Patient Self Care Activities:  . Performs ADL's independently  Plan:  . Patient will contact LCSW if she has any further social work needs or questions   Please see past updates related to this goal by clicking on the "Past Updates" button in the selected goal          Materials Provided: No: Patient declined  Follow Up Plan: Client will follow up with LCSW if future social work needs arise    Dickie La, BSW, MSW, LCSW [facility] Care [facility] [place]  [facility] [email or web address] Phone: [phone]

## 2019-01-03 NOTE — Chronic Care Management (AMB) (Signed)
Chronic Care Management    Clinical Social Work CCM Outreach Note  01/03/2019 Name: KIANNAH MENTH MRN: [id number] DOB: [birth year]  SHAWNISHA CSONKA is a 78 y.o. year old female who is a primary care patient of Crissman, Redge Gainer, MD . The CCM team was consulted for assistance with Level of Care Concerns and Advanced Directive Education.   LCSW reached out to Burnard Leigh today by phone to introduce and offer CCM services. Patient is already active with CCM team.   Goals Addressed    . COMPLETED: Advanced Care Planning needs       Current Barriers:  . Lacks caregiver support.  . Cognitive Deficits  Case Manager Clinical Goal(s):  Marland Kitchen Over the next 30 days, patient will verbalize understanding of plan for completion of advanced directives  Interventions:  . Evaluation of current treatment plan related to advanced directives  . Collaborated with RNCM regarding advanced directives needs/current status . Discussed plans with patient for ongoing care management follow up and provided patient with direct contact information for care management team . LCSW spoke with both patient and daughter and family confirmed to LCSW that they already have a POA which includes healthcare. LCSW offered a face to face visit with patient in order to help assist her with advance directive planning and education but family declined as they do not wish to make any changes to their current POA. Daughter has agreed to make a copy and bring to PCP's office.   Patient Self Care Activities:  . Attends all scheduled provider appointments . Performs ADL's independently  Plan:  . Patient will have daughter bring copy of POA to PCP office . LCSW willbe available to patient/family as needed   Please see past updates related to this goal by clicking on the "Past Updates" button in the selected goal      . COMPLETED: Appropriate Level of Care Needs will be addressed       Current Barriers:  . Lacks caregiver  support - patient referred because of concerns related to possible social isolation; patient confirms that she lives alone but has daughter living nearby who she can call on if she needs anything . Cognitive Deficits - patient has diagnosis of "moderate" dementia; patient able to carry on conversation/answer questions appropriately and take down my contact information today  Case Manager Clinical Goal(s):  Marland Kitchen Over the next 30 days, patient/family will verbalize understanding of plan for addressing level of care concerns  Interventions:  . Evaluation of current treatment plan related to dementia and patient's adherence to plan as established by provider. Marland Kitchen Collaborated with LCSW regarding dementia, home safety, and level of care concerns . Discussed plans with patient for ongoing care management follow up and provided patient with direct contact information for care management team - Family will contact LCSW if any future social work needs arise.  -Update: Patient and family do not wish to make any changes to patient's current level of care at this time but are appreciative of LCSW contacting them to provide educate on CCM program services.   Patient Self Care Activities:  . Performs ADL's independently  Plan:  . Patient will contact LCSW if she has any further social work needs or questions  Please see past updates related to this goal by clicking on the "Past Updates" button in the selected goal   Follow Up Plan: Client will contact CCM program if future social work needs arise  Dickie La, BSW, MSW, [employer]  [facility] Care Management [place]  [facility] [email or web address] Phone: [phone]

## 2019-02-14 ENCOUNTER — Other Ambulatory Visit: Payer: Self-pay | Admitting: Nurse Practitioner

## 2019-02-14 NOTE — Telephone Encounter (Signed)
Please advise on refill.

## 2019-03-02 ENCOUNTER — Other Ambulatory Visit: Payer: Self-pay | Admitting: Nurse Practitioner

## 2019-03-06 ENCOUNTER — Telehealth: Payer: Self-pay

## 2019-03-16 ENCOUNTER — Other Ambulatory Visit: Payer: Self-pay | Admitting: Nurse Practitioner

## 2019-03-16 MED ORDER — FLUTICASONE PROPIONATE 50 MCG/ACT NA SUSP: NASAL | 1 refills | Status: AC

## 2019-03-16 NOTE — Addendum Note (Signed)
Addended by: Redmond Baseman on: 03/16/2019 10:14 AM   Modules accepted: Orders

## 2019-03-16 NOTE — Telephone Encounter (Signed)
Received e-prescribing error flonase; contacted Thayer Ohm, pharmacist at [facility]; she states that the request is still pending; will resend per protocol. Requested Prescriptions  Pending Prescriptions Disp Refills  . fluticasone (FLONASE) 50 MCG/ACT nasal spray 48 g 1    Sig: SPRAY 2 SPRAY IN EACH NOSTRIL DAILY     There is no refill protocol information for this order    Signed Prescriptions Disp Refills   fluticasone (FLONASE) 50 MCG/ACT nasal spray 48 g 1    Sig: SPRAY 2 SPRAY IN EACH NOSTRIL DAILY     Ear, Nose, and Throat: Nasal Preparations - Corticosteroids Passed - 03/16/2019  8:47 AM      Passed - Valid encounter within last 12 months    Recent Outpatient Visits          4 months ago Altered mental status, unspecified altered mental status type   [facility] Marjie Skiff, NP   1 year ago Mild dementia   [facility] Particia Nearing, [place]   1 year ago Weight loss   [facility] [place], [place], DO   1 year ago Essential hypertension   [facility] [place], [place], DO   2 years ago Mild dementia   [facility] Crissman, Redge Gainer, MD

## 2019-03-20 ENCOUNTER — Telehealth: Payer: Self-pay

## 2019-03-23 ENCOUNTER — Telehealth: Payer: Self-pay

## 2019-05-13 ENCOUNTER — Other Ambulatory Visit: Payer: Self-pay | Admitting: Nurse Practitioner

## 2019-05-13 NOTE — Telephone Encounter (Signed)
30 day courtesy refill given  Pt needs appointment for further refills Requested Prescriptions  Pending Prescriptions Disp Refills  . mirtazapine (REMERON) 30 MG tablet [Pharmacy Med Name: MIRTAZAPINE 30 MG TABLET] 30 tablet 0    Sig: TAKE 1 TABLET BY MOUTH AT BEDTIME FOR SLEEP AND APPETITE     Psychiatry: Antidepressants - mirtazapine Failed - 05/13/2019 10:01 AM      Failed - Triglycerides in normal range and within 360 days    Triglycerides  Date Value Ref Range Status  03/29/2017 83 0 - 149 mg/dL Final         Failed - Total Cholesterol in normal range and within 360 days    Cholesterol, Total  Date Value Ref Range Status  03/29/2017 197 100 - 199 mg/dL Final         Failed - Valid encounter within last 6 months    Recent Outpatient Visits          6 months ago Altered mental status, unspecified altered mental status type   [facility] Venita Lick, NP   1 year ago Mild dementia   [facility] Volney American, [place]   1 year ago Weight loss   [place], [place], DO   2 years ago Essential hypertension   [place], [place], DO   2 years ago Mild dementia   [place], MD             Failed - Completed PHQ-2 or PHQ-9 in the last 360 days.      Passed - AST in normal range and within 360 days    AST  Date Value Ref Range Status  10/27/2018 22 0 - 40 IU/L Final         Passed - ALT in normal range and within 360 days    ALT  Date Value Ref Range Status  10/27/2018 10 0 - 32 IU/L Final         Passed - WBC in normal range and within 360 days    WBC  Date Value Ref Range Status  10/27/2018 CANCELED x10E3/uL     Comment:    We have received your request for additional testing, however we are unable to add the test you requested.  The following test(s) were not performed:  Result canceled by the ancillary.   12/06/2015 5.8 3.6 - 11.0 K/uL Final

## 2019-06-12 ENCOUNTER — Other Ambulatory Visit: Payer: Self-pay | Admitting: Family Medicine

## 2019-06-12 NOTE — Telephone Encounter (Signed)
Requested medication (s) are due for refill today: yes  Requested medication (s) are on the active medication list: yes Last refill: 05/13/2019  Future visit scheduled: no  Notes to clinic: spoke with patient and advised her to call the office to schedule a follow up.   Requested Prescriptions  Pending Prescriptions Disp Refills   mirtazapine (REMERON) 30 MG tablet [Pharmacy Med Name: MIRTAZAPINE 30 MG TABLET] 90 tablet 1    Sig: TAKE 1 TABLET BY MOUTH AT BEDTIME FOR SLEEP AND APPETITE     Psychiatry: Antidepressants - mirtazapine Failed - 06/12/2019  2:32 PM      Failed - Triglycerides in normal range and within 360 days    Triglycerides  Date Value Ref Range Status  03/29/2017 83 0 - 149 mg/dL Final         Failed - Total Cholesterol in normal range and within 360 days    Cholesterol, Total  Date Value Ref Range Status  03/29/2017 197 100 - 199 mg/dL Final         Failed - Valid encounter within last 6 months    Recent Outpatient Visits          7 months ago Altered mental status, unspecified altered mental status type   [facility] Venita Lick, NP   1 year ago Mild dementia   [facility] Volney American, [place]   1 year ago Weight loss   [place], [place], DO   2 years ago Essential hypertension   [place], [place], DO   2 years ago Mild dementia   [place], MD             Failed - Completed PHQ-2 or PHQ-9 in the last 360 days.      Passed - AST in normal range and within 360 days    AST  Date Value Ref Range Status  10/27/2018 22 0 - 40 IU/L Final         Passed - ALT in normal range and within 360 days    ALT  Date Value Ref Range Status  10/27/2018 10 0 - 32 IU/L Final         Passed - WBC in normal range and within 360 days    WBC  Date Value Ref Range Status  10/27/2018 CANCELED x10E3/uL     Comment:    We have received your  request for additional testing, however we are unable to add the test you requested.  The following test(s) were not performed:  Result canceled by the ancillary.   12/06/2015 5.8 3.6 - 11.0 K/uL Final

## 2019-07-23 ENCOUNTER — Other Ambulatory Visit: Payer: Self-pay | Admitting: Nurse Practitioner

## 2019-07-23 NOTE — Telephone Encounter (Signed)
Requested medication (s) are due for refill today: yes  Requested medication (s) are on the active medication list: yes  Last refill:  04/25/2019  Future visit scheduled: no  Notes to clinic:  Ordering provider and pcp are different   Requested Prescriptions  Pending Prescriptions Disp Refills   atenolol (TENORMIN) 50 MG tablet [Pharmacy Med Name: ATENOLOL 50 MG TABLET] 90 tablet 2    Sig: Take 1 tablet (50 mg total) by mouth daily.     Cardiovascular:  Beta Blockers Failed - 07/23/2019  1:42 AM      Failed - Valid encounter within last 6 months    Recent Outpatient Visits          8 months ago Altered mental status, unspecified altered mental status type   [facility] Venita Lick, NP   1 year ago Mild dementia   [facility] Volney American, [place]   2 years ago Weight loss   [facility], [place], DO   2 years ago Essential hypertension   [place], [place], DO   2 years ago Mild dementia   [facility] Crissman, Jeannette How, MD             Passed - Last BP in normal range    BP Readings from Last 1 Encounters:  10/27/18 125/88         Passed - Last Heart Rate in normal range    Pulse Readings from Last 1 Encounters:  10/27/18 85

## 2019-08-10 ENCOUNTER — Other Ambulatory Visit: Payer: Self-pay | Admitting: Nurse Practitioner

## 2019-08-14 ENCOUNTER — Other Ambulatory Visit: Payer: Self-pay | Admitting: Nurse Practitioner

## 2019-08-20 ENCOUNTER — Other Ambulatory Visit: Payer: Self-pay | Admitting: Nurse Practitioner

## 2019-08-20 MED ORDER — QUETIAPINE FUMARATE 25 MG PO TABS: ORAL_TABLET | ORAL | 3 refills | Status: DC

## 2019-08-20 NOTE — Telephone Encounter (Signed)
Medication called in to pharmacy.

## 2019-08-20 NOTE — Telephone Encounter (Signed)
Requested medication (s) are due for refill today: yes  Requested medication (s) are on the active medication list: yes  Last refill:  07/06/2019  Future visit scheduled: yes  Notes to clinic:  Review for refill  Pharmacy keeps sending this script   Requested Prescriptions  Pending Prescriptions Disp Refills   QUEtiapine (SEROQUEL) 25 MG tablet [Pharmacy Med Name: QUETIAPINE FUMARATE 25 MG TAB] 30 tablet 6    Sig: TAKE 1 TABLET (25 MG TOTAL) BY MOUTH AT BEDTIME.     Not Delegated - Psychiatry:  Antipsychotics - Second Generation (Atypical) - quetiapine Failed - 08/20/2019  3:23 PM      Failed - This refill cannot be delegated      Failed - ALT in normal range and within 180 days    ALT  Date Value Ref Range Status  10/27/2018 10 0 - 32 IU/L Final         Failed - AST in normal range and within 180 days    AST  Date Value Ref Range Status  10/27/2018 22 0 - 40 IU/L Final         Failed - Valid encounter within last 6 months    Recent Outpatient Visits          9 months ago Altered mental status, unspecified altered mental status type   [facility] Venita Lick, NP   1 year ago Mild dementia   [facility] Volney American, [place]   2 years ago Weight loss   [place], [place], DO   2 years ago Essential hypertension   [place], [place], DO   2 years ago Mild dementia   [place] Crissman, Jeannette How, MD             Failed - Completed PHQ-2 or PHQ-9 in the last 360 days.      Passed - Last BP in normal range    BP Readings from Last 1 Encounters:  10/27/18 125/88

## 2019-08-24 ENCOUNTER — Other Ambulatory Visit: Payer: Self-pay | Admitting: Nurse Practitioner

## 2019-08-24 NOTE — Telephone Encounter (Signed)
Requested medication (s) are due for refill today: yes  Requested medication (s) are on the active medication list: yes  Last refill:  10/27/2018  Future visit scheduled: no  Notes to clinic:  Over due for follow up  Review for refill   Requested Prescriptions  Pending Prescriptions Disp Refills   donepezil (ARICEPT) 10 MG tablet [Pharmacy Med Name: DONEPEZIL HCL 10 MG TABLET] 90 tablet 3    Sig: TAKE 1 TABLET BY MOUTH EVERY DAY AT NIGHT     Neurology:  Alzheimer's Agents Failed - 08/24/2019 11:51 AM      Failed - Valid encounter within last 6 months    Recent Outpatient Visits          10 months ago Altered mental status, unspecified altered mental status type   [facility] Venita Lick, NP   1 year ago Mild dementia   [facility] Volney American, [place]   2 years ago Weight loss   [place], [place], DO   2 years ago Essential hypertension   [place], [place], DO   2 years ago Mild dementia   [facility] Crissman, Jeannette How, MD

## 2019-10-15 ENCOUNTER — Ambulatory Visit: Payer: Medicare HMO | Admitting: Nurse Practitioner

## 2019-10-30 ENCOUNTER — Other Ambulatory Visit: Payer: Self-pay | Admitting: Nurse Practitioner

## 2019-10-30 DIAGNOSIS — F03A0 Unspecified dementia, mild, without behavioral disturbance, psychotic disturbance, mood disturbance, and anxiety: Secondary | ICD-10-CM

## 2019-10-30 DIAGNOSIS — F0390 Unspecified dementia without behavioral disturbance: Secondary | ICD-10-CM

## 2019-10-30 NOTE — Progress Notes (Signed)
ccm 

## 2019-11-05 ENCOUNTER — Ambulatory Visit: Payer: Medicare HMO | Admitting: Nurse Practitioner

## 2019-11-13 ENCOUNTER — Ambulatory Visit: Payer: Medicare HMO | Admitting: Nurse Practitioner

## 2019-11-14 ENCOUNTER — Ambulatory Visit: Payer: Self-pay | Admitting: Licensed Clinical Social Worker

## 2019-11-15 NOTE — Chronic Care Management (AMB) (Signed)
Visit Information  Tara Huffman was given information about Care Management services today including:  1. Care Management services include personalized support from designated clinical staff supervised by her physician, including individualized plan of care and coordination with other care providers 2. 24/7 contact phone numbers for assistance for urgent and routine care needs. 3. The patient may stop CCM services at any time (effective at the end of the month) by phone call to the office staff.  Patient did NOT agree to services and verbal consent obtained. Patient reports that she is unsure who Tara Huffman is and does not wish to work on any current social work needs at this time. Her dementia diagnosis may contribute to this. CCM LCSW provided contact information and encouraged family to call if they change their mind. Future CCM involvement may be needed.  Tara Huffman, BSW, MSW, LCSW [facility] [place]  [facility] [email or web address] Phone: [phone]

## 2019-11-19 ENCOUNTER — Inpatient Hospital Stay
Admission: EM | Admit: 2019-11-19 | Discharge: 2019-11-23 | DRG: 558 | Disposition: A | Discharge status: Skilled Nursing Facility | Payer: Medicare HMO | Attending: Internal Medicine | Admitting: Internal Medicine

## 2019-11-19 ENCOUNTER — Other Ambulatory Visit: Payer: Self-pay

## 2019-11-19 ENCOUNTER — Emergency Department: Payer: Medicare HMO

## 2019-11-19 ENCOUNTER — Encounter: Payer: Self-pay | Admitting: Emergency Medicine

## 2019-11-19 DIAGNOSIS — I361 Nonrheumatic tricuspid (valve) insufficiency: Secondary | ICD-10-CM | POA: Diagnosis not present

## 2019-11-19 DIAGNOSIS — Z8349 Family history of other endocrine, nutritional and metabolic diseases: Secondary | ICD-10-CM

## 2019-11-19 DIAGNOSIS — F29 Unspecified psychosis not due to a substance or known physiological condition: Secondary | ICD-10-CM | POA: Diagnosis not present

## 2019-11-19 DIAGNOSIS — R748 Abnormal levels of other serum enzymes: Secondary | ICD-10-CM

## 2019-11-19 DIAGNOSIS — R2681 Unsteadiness on feet: Secondary | ICD-10-CM | POA: Diagnosis not present

## 2019-11-19 DIAGNOSIS — Z681 Body mass index (BMI) 19 or less, adult: Secondary | ICD-10-CM | POA: Diagnosis not present

## 2019-11-19 DIAGNOSIS — M2550 Pain in unspecified joint: Secondary | ICD-10-CM | POA: Diagnosis not present

## 2019-11-19 DIAGNOSIS — Z20822 Contact with and (suspected) exposure to covid-19: Secondary | ICD-10-CM | POA: Diagnosis not present

## 2019-11-19 DIAGNOSIS — R488 Other symbolic dysfunctions: Secondary | ICD-10-CM | POA: Diagnosis not present

## 2019-11-19 DIAGNOSIS — I10 Essential (primary) hypertension: Secondary | ICD-10-CM | POA: Diagnosis not present

## 2019-11-19 DIAGNOSIS — M6282 Rhabdomyolysis: Secondary | ICD-10-CM | POA: Diagnosis not present

## 2019-11-19 DIAGNOSIS — W19XXXA Unspecified fall, initial encounter: Secondary | ICD-10-CM

## 2019-11-19 DIAGNOSIS — Z79899 Other long term (current) drug therapy: Secondary | ICD-10-CM

## 2019-11-19 DIAGNOSIS — E785 Hyperlipidemia, unspecified: Secondary | ICD-10-CM | POA: Diagnosis not present

## 2019-11-19 DIAGNOSIS — R4182 Altered mental status, unspecified: Secondary | ICD-10-CM | POA: Diagnosis not present

## 2019-11-19 DIAGNOSIS — Z888 Allergy status to other drugs, medicaments and biological substances status: Secondary | ICD-10-CM | POA: Diagnosis not present

## 2019-11-19 DIAGNOSIS — R778 Other specified abnormalities of plasma proteins: Secondary | ICD-10-CM

## 2019-11-19 DIAGNOSIS — I959 Hypotension, unspecified: Secondary | ICD-10-CM | POA: Diagnosis not present

## 2019-11-19 DIAGNOSIS — G4700 Insomnia, unspecified: Secondary | ICD-10-CM | POA: Diagnosis present

## 2019-11-19 DIAGNOSIS — M4802 Spinal stenosis, cervical region: Secondary | ICD-10-CM | POA: Diagnosis not present

## 2019-11-19 DIAGNOSIS — I4519 Other right bundle-branch block: Secondary | ICD-10-CM | POA: Diagnosis present

## 2019-11-19 DIAGNOSIS — I214 Non-ST elevation (NSTEMI) myocardial infarction: Secondary | ICD-10-CM | POA: Diagnosis not present

## 2019-11-19 DIAGNOSIS — I4891 Unspecified atrial fibrillation: Secondary | ICD-10-CM | POA: Diagnosis not present

## 2019-11-19 DIAGNOSIS — M6281 Muscle weakness (generalized): Secondary | ICD-10-CM | POA: Diagnosis not present

## 2019-11-19 DIAGNOSIS — F03C0 Unspecified dementia, severe, without behavioral disturbance, psychotic disturbance, mood disturbance, and anxiety: Secondary | ICD-10-CM | POA: Diagnosis present

## 2019-11-19 DIAGNOSIS — R636 Underweight: Secondary | ICD-10-CM | POA: Diagnosis not present

## 2019-11-19 DIAGNOSIS — R7989 Other specified abnormal findings of blood chemistry: Secondary | ICD-10-CM | POA: Diagnosis not present

## 2019-11-19 DIAGNOSIS — F0390 Unspecified dementia without behavioral disturbance: Secondary | ICD-10-CM | POA: Diagnosis present

## 2019-11-19 DIAGNOSIS — F19939 Other psychoactive substance use, unspecified with withdrawal, unspecified: Secondary | ICD-10-CM | POA: Diagnosis not present

## 2019-11-19 DIAGNOSIS — Z7401 Bed confinement status: Secondary | ICD-10-CM | POA: Diagnosis not present

## 2019-11-19 DIAGNOSIS — T796XXA Traumatic ischemia of muscle, initial encounter: Secondary | ICD-10-CM | POA: Diagnosis not present

## 2019-11-19 DIAGNOSIS — W19XXXD Unspecified fall, subsequent encounter: Secondary | ICD-10-CM | POA: Diagnosis not present

## 2019-11-19 DIAGNOSIS — R498 Other voice and resonance disorders: Secondary | ICD-10-CM | POA: Diagnosis not present

## 2019-11-19 DIAGNOSIS — G319 Degenerative disease of nervous system, unspecified: Secondary | ICD-10-CM | POA: Diagnosis not present

## 2019-11-19 DIAGNOSIS — M47812 Spondylosis without myelopathy or radiculopathy, cervical region: Secondary | ICD-10-CM | POA: Diagnosis not present

## 2019-11-19 DIAGNOSIS — I219 Acute myocardial infarction, unspecified: Secondary | ICD-10-CM | POA: Diagnosis not present

## 2019-11-19 DIAGNOSIS — R9431 Abnormal electrocardiogram [ECG] [EKG]: Secondary | ICD-10-CM | POA: Diagnosis not present

## 2019-11-19 DIAGNOSIS — I6782 Cerebral ischemia: Secondary | ICD-10-CM | POA: Diagnosis not present

## 2019-11-19 DIAGNOSIS — F0150 Vascular dementia without behavioral disturbance: Secondary | ICD-10-CM | POA: Diagnosis not present

## 2019-11-19 DIAGNOSIS — I2510 Atherosclerotic heart disease of native coronary artery without angina pectoris: Secondary | ICD-10-CM | POA: Diagnosis not present

## 2019-11-19 DIAGNOSIS — I2119 ST elevation (STEMI) myocardial infarction involving other coronary artery of inferior wall: Secondary | ICD-10-CM | POA: Diagnosis present

## 2019-11-19 DIAGNOSIS — R0981 Nasal congestion: Secondary | ICD-10-CM | POA: Diagnosis not present

## 2019-11-19 HISTORY — DX: Unspecified dementia, unspecified severity, without behavioral disturbance, psychotic disturbance, mood disturbance, and anxiety: F03.90

## 2019-11-19 LAB — COMPREHENSIVE METABOLIC PANEL
ALT: 24 U/L (ref 0–44)
AST: 68 U/L — ABNORMAL HIGH (ref 15–41)
Albumin: 4 g/dL (ref 3.5–5.0)
Alkaline Phosphatase: 65 U/L (ref 38–126)
Anion gap: 19 — ABNORMAL HIGH (ref 5–15)
BUN: 12 mg/dL (ref 8–23)
CO2: 17 mmol/L — ABNORMAL LOW (ref 22–32)
Calcium: 9.7 mg/dL (ref 8.9–10.3)
Chloride: 97 mmol/L — ABNORMAL LOW (ref 98–111)
Creatinine, Ser: 0.62 mg/dL (ref 0.44–1.00)
GFR calc Af Amer: >60 mL/min (ref >60)
GFR calc non Af Amer: >60 mL/min (ref >60)
Glucose, Bld: 129 mg/dL — ABNORMAL HIGH (ref 70–99)
Potassium: 4.1 mmol/L (ref 3.5–5.1)
Sodium: 133 mmol/L — ABNORMAL LOW (ref 135–145)
Total Bilirubin: 1.7 mg/dL — ABNORMAL HIGH (ref 0.3–1.2)
Total Protein: 7.8 g/dL (ref 6.5–8.1)

## 2019-11-19 LAB — CBC WITH DIFFERENTIAL/PLATELET
Abs Immature Granulocytes: 0.07 10*3/uL (ref 0.00–0.07)
Basophils Absolute: 0 10*3/uL (ref 0.0–0.1)
Basophils Relative: 0 %
Eosinophils Absolute: 0 10*3/uL (ref 0.0–0.5)
Eosinophils Relative: 0 %
HCT: 46.9 % — ABNORMAL HIGH (ref 36.0–46.0)
Hemoglobin: 15.7 g/dL — ABNORMAL HIGH (ref 12.0–15.0)
Immature Granulocytes: 1 %
Lymphocytes Relative: 6 %
Lymphs Abs: 0.6 10*3/uL — ABNORMAL LOW (ref 0.7–4.0)
MCH: 31.2 pg (ref 26.0–34.0)
MCHC: 33.5 g/dL (ref 30.0–36.0)
MCV: 93.2 fL (ref 80.0–100.0)
Monocytes Absolute: 0.8 10*3/uL (ref 0.1–1.0)
Monocytes Relative: 8 %
Neutro Abs: 9 10*3/uL — ABNORMAL HIGH (ref 1.7–7.7)
Neutrophils Relative %: 85 %
Platelets: 314 10*3/uL (ref 150–400)
RBC: 5.03 MIL/uL (ref 3.87–5.11)
RDW: 12.3 % (ref 11.5–15.5)
WBC: 10.5 10*3/uL (ref 4.0–10.5)
nRBC: 0 % (ref 0.0–0.2)

## 2019-11-19 LAB — TROPONIN I (HIGH SENSITIVITY)
Troponin I (High Sensitivity): 215 ng/L (ref <18)
Troponin I (High Sensitivity): 262 ng/L (ref <18)

## 2019-11-19 LAB — CK: Total CK: 1064 U/L — ABNORMAL HIGH (ref 38–234)

## 2019-11-19 MED ORDER — ASPIRIN 81 MG PO CHEW: 324.0000 mg | CHEWABLE_TABLET | ORAL | Status: AC

## 2019-11-19 MED ORDER — MIRTAZAPINE 15 MG PO TABS
30.0000 mg | ORAL_TABLET | Freq: Every day | ORAL | Status: DC
  Administered 2019-11-20 – 2019-11-22 (×3): 30 mg via ORAL
  Filled 2019-11-19 (×4): qty 2

## 2019-11-19 MED ORDER — ONDANSETRON HCL 4 MG/2ML IJ SOLN: 4.0000 mg | Freq: Four times a day (QID) | INTRAMUSCULAR | Status: DC | PRN

## 2019-11-19 MED ORDER — QUETIAPINE FUMARATE 25 MG PO TABS
25.0000 mg | ORAL_TABLET | Freq: Every day | ORAL | Status: DC
  Administered 2019-11-20 – 2019-11-22 (×3): 25 mg via ORAL
  Filled 2019-11-19 (×3): qty 1

## 2019-11-19 MED ORDER — HEPARIN BOLUS VIA INFUSION
3000.0000 [IU] | Freq: Once | INTRAVENOUS | Status: AC
  Administered 2019-11-20: 3000 [IU] via INTRAVENOUS
  Filled 2019-11-19: qty 3000

## 2019-11-19 MED ORDER — HEPARIN (PORCINE) 25000 UT/250ML-% IV SOLN
550.0000 [IU]/h | INTRAVENOUS | Status: DC
  Administered 2019-11-20: 600 [IU]/h via INTRAVENOUS
  Filled 2019-11-19: qty 250

## 2019-11-19 MED ORDER — ASPIRIN 300 MG RE SUPP: 300.0000 mg | RECTAL | Status: AC

## 2019-11-19 MED ORDER — ASPIRIN 81 MG PO CHEW
324.0000 mg | CHEWABLE_TABLET | Freq: Once | ORAL | Status: AC
  Administered 2019-11-19: 21:00:00 324 mg via ORAL
  Filled 2019-11-19: qty 4

## 2019-11-19 MED ORDER — ASPIRIN EC 81 MG PO TBEC
81.0000 mg | DELAYED_RELEASE_TABLET | Freq: Every day | ORAL | Status: DC
  Administered 2019-11-20 – 2019-11-23 (×3): 81 mg via ORAL
  Filled 2019-11-19 (×4): qty 1

## 2019-11-19 MED ORDER — ACETAMINOPHEN 325 MG PO TABS: 650.0000 mg | ORAL_TABLET | ORAL | Status: DC | PRN

## 2019-11-19 MED ORDER — ATORVASTATIN CALCIUM 20 MG PO TABS
40.0000 mg | ORAL_TABLET | Freq: Every day | ORAL | Status: DC
  Administered 2019-11-20 – 2019-11-22 (×3): 40 mg via ORAL
  Filled 2019-11-19 (×3): qty 2

## 2019-11-19 MED ORDER — ATENOLOL 50 MG PO TABS
50.0000 mg | ORAL_TABLET | Freq: Every day | ORAL | Status: DC
  Administered 2019-11-20: 50 mg via ORAL
  Filled 2019-11-19: qty 1

## 2019-11-19 MED ORDER — NITROGLYCERIN 0.4 MG SL SUBL: 0.4000 mg | SUBLINGUAL_TABLET | SUBLINGUAL | Status: DC | PRN

## 2019-11-19 MED ORDER — SODIUM CHLORIDE 0.9 % IV SOLN: Freq: Once | INTRAVENOUS | Status: AC

## 2019-11-19 MED ORDER — DONEPEZIL HCL 5 MG PO TABS
10.0000 mg | ORAL_TABLET | Freq: Every day | ORAL | Status: DC
  Administered 2019-11-20 – 2019-11-22 (×3): 10 mg via ORAL
  Filled 2019-11-19 (×4): qty 2

## 2019-11-19 NOTE — H&P (Signed)
History and Physical    Tara Huffman XBD:[id number] DOB: 30-Oct-1940 DOA: 11/19/2019  PCP: Marjie Skiff, NP (Confirm with patient/family/NH records and if not entered, this has to be entered at [facility] point of entry) Patient coming from: coming from home  I have personally briefly reviewed patient's old medical records in [facility]  Chief Complaint: found down  HPI: Tara Huffman is a 79 y.o. female with medical history significant of h/o HTN, HDL, dementia  presents to the emergency department today after being found down by family.  Apparently family last talked to the patient a couple of days ago.  They were not able to reach her on the phone yesterday.  When they came to check on her today they found her in the empty bathtub.  The patient was fully clothed.  It is unclear how long the patient had been in the bathtub.  She is unable to give any significant history.   (For level 3, the HPI must include 4+ descriptors: Location, Quality, Severity, Duration, Timing, Context, modifying factors, associated signs/symptoms and/or status of 3+ chronic problems.)  (Please avoid self-populating past medical history here) (The initial 2-3 lines should be focused and good to copy and paste in the HPI section of the daily progress note).  ED Course: BP stable.  EKG x 2 with a. Fib and inferior injury. Labs reveal Troponin #1 215, #2 262. CK 1,064. IVF started. TRH called to admit to manage Rhabdo and possible acute MI.  Review of Systems: As per HPI otherwise 10 point review of systems negative.  Unacceptable ROS statements: "10 systems reviewed," "Extensive" (without elaboration).  Acceptable ROS statements: "All others negative," "All others reviewed and are negative," and "All others unremarkable," with at LEAST ONE ROS documented Can't double dip - if using for HPI can't use for ROS  Past Medical History:  Diagnosis Date  . Allergy   . Dementia (HCC)   . Hyperlipidemia   .  Hypertension   . Insomnia   . Insomnia     History reviewed. No pertinent surgical history.  Social Hx  - married 42 years, widowed 2015. Has one son and one daughter. She lives alone, despite dementia. 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 She does drink a 6 pack every night, along with psychotropic meds.   reports that she has never smoked. She has never used smokeless tobacco. She reports current alcohol use of about 14.0 standard drinks of alcohol per week. She reports that she does not use drugs.  Allergies  Allergen Reactions  . Ambien [Zolpidem]     Sleep walking    Family History  Problem Relation Age of Onset  . Thyroid disease Sister    Unacceptable: Noncontributory, unremarkable, or negative. Acceptable: Family history reviewed and not pertinent (If you reviewed it)  Prior to Admission medications   Medication Sig Start Date End Date Taking? Authorizing Provider  atenolol (TENORMIN) 50 MG tablet TAKE 1 TABLET (50 MG TOTAL) BY MOUTH DAILY. 07/23/19  Yes Crissman, Redge Gainer, MD  donepezil (ARICEPT) 10 MG tablet TAKE 1 TABLET BY MOUTH EVERY DAY AT NIGHT Patient taking differently: Take 10 mg by mouth at bedtime. TAKE 1 TABLET BY MOUTH EVERY DAY AT NIGHT 10/27/18  Yes Cannady, Jolene T, NP  fluticasone (FLONASE) 50 MCG/ACT nasal spray SPRAY 2 SPRAY IN EACH NOSTRIL DAILY Patient taking differently: Place 2 sprays into both nostrils daily. SPRAY 2 SPRAY IN  EACH NOSTRIL DAILY 03/16/19  Yes Steele Sizer, MD  QUEtiapine (SEROQUEL) 25 MG tablet TAKE 1 TABLET (25 MG TOTAL) BY MOUTH AT BEDTIME. 08/20/19  Yes Cannady, Jolene T, NP  mirtazapine (REMERON) 30 MG tablet TAKE 1 TABLET BY MOUTH AT BEDTIME FOR SLEEP AND APPETITE Patient not taking: TAKE 1 TABLET BY MOUTH AT BEDTIME FOR SLEEP AND APPETITE 06/13/19   Steele Sizer, MD    Physical Exam: Vitals:   11/19/19 2000 11/19/19 2200 11/19/19 2213 11/19/19 2300  BP: (!) 151/70 (!)  135/53  (!) 140/54  Pulse: 71 69 70 71  Resp: 16 12 14 16   Temp:      TempSrc:      SpO2: 99% 100% 100% 100%  Weight:      Height:        Constitutional: NAD, calm, comfortable Vitals:   11/19/19 2000 11/19/19 2200 11/19/19 2213 11/19/19 2300  BP: (!) 151/70 (!) 135/53  (!) 140/54  Pulse: 71 69 70 71  Resp: 16 12 14 16   Temp:      TempSrc:      SpO2: 99% 100% 100% 100%  Weight:      Height:       General:- WNWD elderly woman in no distress or pain. Eyes: PERRL, lids and conjunctivae normal ENMT: Mucous membranes are moist. Posterior pharynx clear of any exudate or lesions.Normal dentition.  Neck: normal, stiff but with active movement, no masses, no thyromegaly Respiratory: clear to auscultation bilaterally, no wheezing, no crackles. Normal respiratory effort. No accessory muscle use.  Cardiovascular: Irregularly irregular vs frequent PVCs,  no murmurs / rubs / gallops. No extremity edema. 2+ pedal pulses. No carotid bruits.  Abdomen: no tenderness, no masses palpated. No hepatosplenomegaly. Bowel sounds positive.  Musculoskeletal: no clubbing / cyanosis. No joint deformity upper and lower extremities. Good ROM, no contractures. Normal muscle tone.  Skin: no rashes, lesions, ulcers. No induration Neurologic: CN 2-12 grossly intact. Sensation intact. Strength 5/5 in all 4. Tremulous Psychiatric: Poor judgement. Alert and oriented x 3. Normal mood.   (Anything < 9 systems with 2 bullets each down codes to level 1) (If patient refuses exam can't bill higher level) (Make sure to document decubitus ulcers present on admission -- if possible -- and whether patient has chronic indwelling catheter at time of admission)  Labs on Admission: I have personally reviewed following labs and imaging studies  CBC: Recent Labs  Lab 11/19/19 1850  WBC 10.5  NEUTROABS 9.0*  HGB 15.7*  HCT 46.9*  MCV 93.2  PLT 314   Basic Metabolic Panel: Recent Labs  Lab 11/19/19 1850  NA 133*  K  4.1  CL 97*  CO2 17*  GLUCOSE 129*  BUN 12  CREATININE 0.62  CALCIUM 9.7   GFR: Estimated Creatinine Clearance: 47.8 mL/min (by C-G formula based on SCr of 0.62 mg/dL). Liver Function Tests: Recent Labs  Lab 11/19/19 1850  AST 68*  ALT 24  ALKPHOS 65  BILITOT 1.7*  PROT 7.8  ALBUMIN 4.0   No results for input(s): LIPASE, AMYLASE in the last 168 hours. No results for input(s): AMMONIA in the last 168 hours. Coagulation Profile: No results for input(s): INR, PROTIME in the last 168 hours. Cardiac Enzymes: Recent Labs  Lab 11/19/19 1850  CKTOTAL 1,064*   BNP (last 3 results) No results for input(s): PROBNP in the last 8760 hours. HbA1C: No results for input(s): HGBA1C in the last 72 hours. CBG: No results for input(s): GLUCAP in  the last 168 hours. Lipid Profile: No results for input(s): CHOL, HDL, LDLCALC, TRIG, CHOLHDL, LDLDIRECT in the last 72 hours. Thyroid Function Tests: No results for input(s): TSH, T4TOTAL, FREET4, T3FREE, THYROIDAB in the last 72 hours. Anemia Panel: No results for input(s): VITAMINB12, FOLATE, FERRITIN, TIBC, IRON, RETICCTPCT in the last 72 hours. Urine analysis:    Component Value Date/Time   COLORURINE YELLOW (A) 12/06/2015 1626   APPEARANCEUR Clear 10/27/2018 1453   LABSPEC 1.016 12/06/2015 1626   PHURINE 5.0 12/06/2015 1626   GLUCOSEU Negative 10/27/2018 1453   HGBUR 2+ (A) 12/06/2015 1626   BILIRUBINUR Negative 10/27/2018 1453   KETONESUR NEGATIVE 12/06/2015 1626   PROTEINUR Trace (A) 10/27/2018 1453   PROTEINUR NEGATIVE 12/06/2015 1626   NITRITE Negative 10/27/2018 1453   NITRITE NEGATIVE 12/06/2015 1626   LEUKOCYTESUR Trace (A) 10/27/2018 1453    Radiological Exams on Admission: CT Head Wo Contrast  Result Date: 11/19/2019 CLINICAL DATA:  Recent fall EXAM: CT HEAD WITHOUT CONTRAST CT CERVICAL SPINE WITHOUT CONTRAST TECHNIQUE: Multidetector CT imaging of the head and cervical spine was performed following the standard  protocol without intravenous contrast. Multiplanar CT image reconstructions of the cervical spine were also generated. COMPARISON:  None. FINDINGS: CT HEAD FINDINGS Brain: Chronic atrophic changes and chronic white matter ischemic changes are noted. No findings to suggest acute hemorrhage, acute infarction or space-occupying mass lesion are noted. Vascular: No hyperdense vessel or unexpected calcification. Skull: Normal. Negative for fracture or focal lesion. Sinuses/Orbits: No acute finding. Other: None. CT CERVICAL SPINE FINDINGS Alignment: Within normal limits. Skull base and vertebrae: 7 cervical segments are well visualized. Vertebral body height is well maintained. Multilevel facet hypertrophic changes are seen. Mild disc space narrowing is noted primarily at C5-6 with vacuum disc phenomena. No acute fracture or acute facet abnormality is noted. The odontoid is within normal limits. Soft tissues and spinal canal: Surrounding soft tissue structures show no acute abnormality. Upper chest: Visualized lung apices are within normal limits. Other: None IMPRESSION: CT of the head: Chronic atrophic and ischemic changes without acute abnormality. CT of the cervical spine: Multilevel degenerative change without acute abnormality. Electronically Signed   By: Inez Catalina M.D.   On: 11/19/2019 19:37   CT Cervical Spine Wo Contrast  Result Date: 11/19/2019 CLINICAL DATA:  Recent fall EXAM: CT HEAD WITHOUT CONTRAST CT CERVICAL SPINE WITHOUT CONTRAST TECHNIQUE: Multidetector CT imaging of the head and cervical spine was performed following the standard protocol without intravenous contrast. Multiplanar CT image reconstructions of the cervical spine were also generated. COMPARISON:  None. FINDINGS: CT HEAD FINDINGS Brain: Chronic atrophic changes and chronic white matter ischemic changes are noted. No findings to suggest acute hemorrhage, acute infarction or space-occupying mass lesion are noted. Vascular: No hyperdense  vessel or unexpected calcification. Skull: Normal. Negative for fracture or focal lesion. Sinuses/Orbits: No acute finding. Other: None. CT CERVICAL SPINE FINDINGS Alignment: Within normal limits. Skull base and vertebrae: 7 cervical segments are well visualized. Vertebral body height is well maintained. Multilevel facet hypertrophic changes are seen. Mild disc space narrowing is noted primarily at C5-6 with vacuum disc phenomena. No acute fracture or acute facet abnormality is noted. The odontoid is within normal limits. Soft tissues and spinal canal: Surrounding soft tissue structures show no acute abnormality. Upper chest: Visualized lung apices are within normal limits. Other: None IMPRESSION: CT of the head: Chronic atrophic and ischemic changes without acute abnormality. CT of the cervical spine: Multilevel degenerative change without acute abnormality. Electronically Signed  By: Alcide Clever M.D.   On: 11/19/2019 19:37    EKG: Independently reviewed. A. Fib, question of inferior injury w/o obvious ST elevation  Assessment/Plan Active Problems:   MI, acute, non ST segment elevation (HCC)   Rhabdomyolysis   Hyperlipidemia   Hypertension   Mild dementia (HCC)   Acute MI, inferior wall (HCC)  (please populate well all problems here in Problem List. (For example, if patient is on BP meds at home and you resume or decide to hold them, it is a problem that needs to be her. Same for CAD, COPD, HLD and so on)   1. Rhabdomyolysis - patient was immobile in bathtubefor indeterminate period of time. CK > 1,000 Plan IVF  F/u CK in AM  2. Cardiac - new a fib with normal rate and suggestion of inferior myocardial injury. Patient w/o chest pain and hemodynamically stable. Risk factors include age, lipids, HTN. She is taking B-blocker. Plan Admit to telemetry  Cycle troponins  Continue B-blocker  ASA  IV heparin until ruled out  F/u EKG  3. HTN- BP stable - continue home meds  4. Dementia - per  family she is "Ornery" but does take some of her meds. She also drinks at least a 6 pack every day. Plan Continue home meds  AIWA protocol   DVT prophylaxis: heparin (Lovenox/Heparin/SCD's/anticoagulated/None (if comfort care) Code Status: full code (Full/Partial (specify details) Family Communication: spoke with daughter who was present during exam (Specify name, relationship. Do not write "discussed with patient". Specify tel # if discussed over the phone) Disposition Plan: To be determined - recommended that patient not live independently (specify when and where you expect patient to be discharged) Consults called: Cardiology - spoke with Dr. Jens Som for [facility] (with names) Admission status: telemetry - inpatient (inpatient / obs / tele / medical floor / SDU)   Illene Regulus MD [facility] Pager [phone]  If 7PM-7AM, please contact night-coverage [email or web address] Password [facility]  11/19/2019, 11:24 PM

## 2019-11-19 NOTE — ED Provider Notes (Signed)
[facility] Emergency Department Provider Note    ____________________________________________   I have reviewed the triage vital signs and the nursing notes.   HISTORY  Chief Complaint Fall   History limited by and level 5 caveat due to: Dementia   HPI Tara Huffman is a 79 y.o. female who presents to the emergency department today after being found down by family.  Apparently family last talked to the patient a couple of days ago.  They were not able to reach her on the phone yesterday.  When they came to check on her today they found her in the bathtub.  Water was normal and patient was closed.  It is unclear how long the patient had been in the bathtub.  She is unable to give any significant history.    Records reviewed. Per medical record review patient has a history of dementia, HLD, HTN.   Past Medical History:  Diagnosis Date  . Allergy   . Dementia ([place])   . Hyperlipidemia   . Hypertension   . Insomnia   . Insomnia     Patient Active Problem List   Diagnosis Date Noted  . Altered mental status 10/27/2018  . 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 Advanced care planning/counseling discussion 10/27/2018  . Mild dementia ([place]) 07/27/2016  . Hallucinations 07/29/2015  . Hyperlipidemia   . Hypertension   . Insomnia     History reviewed. No pertinent surgical history.  Prior to Admission medications   Medication Sig Start Date End Date Taking? Authorizing Provider  atenolol (TENORMIN) 50 MG tablet TAKE 1 TABLET (50 MG TOTAL) BY MOUTH DAILY. 07/23/19   Guadalupe Maple, MD  donepezil (ARICEPT) 10 MG tablet TAKE 1 TABLET BY MOUTH EVERY DAY AT NIGHT 10/27/18   Cannady, Henrine Screws T, NP  fluticasone (FLONASE) 50 MCG/ACT nasal spray SPRAY 2 SPRAY IN EACH NOSTRIL DAILY 03/16/19   Guadalupe Maple, MD  mirtazapine (REMERON) 30 MG tablet TAKE 1 TABLET BY MOUTH AT BEDTIME FOR SLEEP AND APPETITE 06/13/19   Guadalupe Maple, MD  QUEtiapine (SEROQUEL) 25 MG  tablet TAKE 1 TABLET (25 MG TOTAL) BY MOUTH AT BEDTIME. 08/20/19   Cannady, Henrine Screws T, NP    Allergies Ambien [zolpidem]  Family History  Problem Relation Age of Onset  . Thyroid disease Sister     Social History Social History   Tobacco Use  . Smoking status: Never Smoker  . Smokeless tobacco: Never Used  Substance Use Topics  . Alcohol use: Yes    Alcohol/week: 14.0 standard drinks    Types: 14 Cans of beer per week  . Drug use: No    ROS: Unable to obtain reliable ROS secondary to dementia ____________________________________________   PHYSICAL EXAM:  VITAL SIGNS: ED Triage Vitals  Enc Vitals Group     BP 11/19/19 1827 (!) 160/85     Pulse Rate 11/19/19 1827 71     Resp 11/19/19 1827 18     Temp 11/19/19 1827 97.6 F (36.4 C)     Temp Source 11/19/19 1827 Oral     SpO2 11/19/19 1827 100 %     Weight 11/19/19 1828 115 lb (52.2 kg)     Height 11/19/19 1828 5\' 6"  (1.676 m)   Constitutional: Awake and alert. Not oriented. Eyes: Conjunctivae are normal.  ENT      Head: Normocephalic and atraumatic.      Nose: No congestion/rhinnorhea.      Mouth/Throat: Mucous membranes are moist.  Neck: No stridor. Hematological/Lymphatic/Immunilogical: No cervical lymphadenopathy. Cardiovascular: Normal rate, irregular rhythm.  No murmurs, rubs, or gallops.  Respiratory: Normal respiratory effort without tachypnea nor retractions. Breath sounds are clear and equal bilaterally. No wheezes/rales/rhonchi. Gastrointestinal: Soft and non tender. No rebound. No guarding.  Genitourinary: Deferred Musculoskeletal: Normal range of motion in all extremities. No lower extremity edema. Neurologic:  Normal speech and language. No gross focal neurologic deficits are appreciated.  Skin:  Skin is warm, dry and intact. No rash noted. Psychiatric: Mood and affect are normal. Speech and behavior are normal. Patient exhibits appropriate insight and  judgment.  ____________________________________________    LABS (pertinent positives/negatives)  CK 1064 Trop hs 215 CBC wbc 10.5, hgb 15.7, plt 314 CMP na 133, k 4.1, glu 129, cr 0.62 ____________________________________________   EKG  I, Phineas Semen, attending physician, personally viewed and interpreted this EKG  EKG Time: 1944 Rate: 70 Rhythm: atrial fibrillation Axis: normal Intervals: qtc 448 QRS: incomplete RBBB ST changes: no st elevation Impression: abnormal ekg   ____________________________________________    RADIOLOGY  CT head/cervical spine No acute traumatic abnormality  ____________________________________________   PROCEDURES  Procedures  ____________________________________________   INITIAL IMPRESSION / ASSESSMENT AND PLAN / ED COURSE  Pertinent labs & imaging results that were available during my care of the patient were reviewed by me and considered in my medical decision making (see chart for details).   Patient presented to the emergency department today after being found in the bathtub by family.  Is unclear at this time how long she might have been in the bathtub.  Patient herself cannot get any significant history.  She however denies any pain.  Head CT and cervical spine CT were obtained given history of fall without any concerning findings.  Blood work was significant for elevated troponin and elevated CK.  Will plan on admission.  ____________________________________________   FINAL CLINICAL IMPRESSION(S) / ED DIAGNOSES  Final diagnoses:  Fall, initial encounter  Elevated troponin  Elevated CK     Note: This dictation was prepared with Dragon dictation. Any transcriptional errors that result from this process are unintentional     Phineas Semen, MD 11/19/19 2316

## 2019-11-19 NOTE — ED Triage Notes (Addendum)
Pt via EMS from home. Daughter called EMS because pt was found in the bathroom in the tub fully clothed. It is unknown how long she was in the tub but the daughter states she last spoke with the pt on Saturday. Pt is c/o of neck pain on palpation. Pt does have a hx of dementia. Pt is alert and does not seem to be soiled with urine or feces. On arrival, pt seem to have tremors. Per daughter, pt drinks alcohol.

## 2019-11-20 DIAGNOSIS — R9431 Abnormal electrocardiogram [ECG] [EKG]: Secondary | ICD-10-CM

## 2019-11-20 DIAGNOSIS — T796XXA Traumatic ischemia of muscle, initial encounter: Secondary | ICD-10-CM

## 2019-11-20 DIAGNOSIS — W19XXXA Unspecified fall, initial encounter: Secondary | ICD-10-CM

## 2019-11-20 DIAGNOSIS — R778 Other specified abnormalities of plasma proteins: Secondary | ICD-10-CM

## 2019-11-20 DIAGNOSIS — R748 Abnormal levels of other serum enzymes: Secondary | ICD-10-CM

## 2019-11-20 LAB — COMPREHENSIVE METABOLIC PANEL
ALT: 22 U/L (ref 0–44)
AST: 60 U/L — ABNORMAL HIGH (ref 15–41)
Albumin: 3.2 g/dL — ABNORMAL LOW (ref 3.5–5.0)
Alkaline Phosphatase: 49 U/L (ref 38–126)
Anion gap: 16 — ABNORMAL HIGH (ref 5–15)
BUN: 12 mg/dL (ref 8–23)
CO2: 19 mmol/L — ABNORMAL LOW (ref 22–32)
Calcium: 9 mg/dL (ref 8.9–10.3)
Chloride: 103 mmol/L (ref 98–111)
Creatinine, Ser: 0.64 mg/dL (ref 0.44–1.00)
GFR calc Af Amer: >60 mL/min (ref >60)
GFR calc non Af Amer: >60 mL/min (ref >60)
Glucose, Bld: 97 mg/dL (ref 70–99)
Potassium: 3.5 mmol/L (ref 3.5–5.1)
Sodium: 138 mmol/L (ref 135–145)
Total Bilirubin: 1 mg/dL (ref 0.3–1.2)
Total Protein: 6.4 g/dL — ABNORMAL LOW (ref 6.5–8.1)

## 2019-11-20 LAB — CBC
HCT: 43.2 % (ref 36.0–46.0)
HCT: 41.1 % (ref 36.0–46.0)
Hemoglobin: 14.4 g/dL (ref 12.0–15.0)
Hemoglobin: 13.5 g/dL (ref 12.0–15.0)
MCH: 31.1 pg (ref 26.0–34.0)
MCH: 30.7 pg (ref 26.0–34.0)
MCHC: 33.3 g/dL (ref 30.0–36.0)
MCHC: 32.8 g/dL (ref 30.0–36.0)
MCV: 93.3 fL (ref 80.0–100.0)
MCV: 93.4 fL (ref 80.0–100.0)
Platelets: 278 10*3/uL (ref 150–400)
Platelets: 262 10*3/uL (ref 150–400)
RBC: 4.63 MIL/uL (ref 3.87–5.11)
RBC: 4.4 MIL/uL (ref 3.87–5.11)
RDW: 12.3 % (ref 11.5–15.5)
RDW: 12.1 % (ref 11.5–15.5)
WBC: 10.5 10*3/uL (ref 4.0–10.5)
WBC: 9.2 10*3/uL (ref 4.0–10.5)
nRBC: 0 % (ref 0.0–0.2)
nRBC: 0 % (ref 0.0–0.2)

## 2019-11-20 LAB — PROTIME-INR
INR: 1.1 (ref 0.8–1.2)
Prothrombin Time: 14.4 seconds (ref 11.4–15.2)

## 2019-11-20 LAB — SARS CORONAVIRUS 2 (TAT 6-24 HRS): SARS Coronavirus 2: NEGATIVE

## 2019-11-20 LAB — MAGNESIUM
Magnesium: 1.8 mg/dL (ref 1.7–2.4)
Magnesium: 1.8 mg/dL (ref 1.7–2.4)

## 2019-11-20 LAB — TROPONIN I (HIGH SENSITIVITY)
Troponin I (High Sensitivity): 266 ng/L (ref <18)
Troponin I (High Sensitivity): 243 ng/L (ref <18)

## 2019-11-20 LAB — APTT: aPTT: >160 seconds (ref 24–36)

## 2019-11-20 LAB — CK: Total CK: 1016 U/L — ABNORMAL HIGH (ref 38–234)

## 2019-11-20 LAB — HEPARIN LEVEL (UNFRACTIONATED): Heparin Unfractionated: 0.71 IU/mL — ABNORMAL HIGH (ref 0.30–0.70)

## 2019-11-20 LAB — PHOSPHORUS: Phosphorus: 4.3 mg/dL (ref 2.5–4.6)

## 2019-11-20 MED ORDER — ADULT MULTIVITAMIN W/MINERALS CH
1.0000 | ORAL_TABLET | Freq: Every day | ORAL | Status: DC
  Administered 2019-11-20 – 2019-11-23 (×3): 1 via ORAL
  Filled 2019-11-20 (×4): qty 1

## 2019-11-20 MED ORDER — LORAZEPAM 2 MG/ML IJ SOLN
1.0000 mg | INTRAMUSCULAR | Status: AC | PRN
  Administered 2019-11-20: 4 mg via INTRAVENOUS
  Filled 2019-11-20: qty 2

## 2019-11-20 MED ORDER — LORAZEPAM 1 MG PO TABS: 1.0000 mg | ORAL_TABLET | ORAL | Status: AC | PRN

## 2019-11-20 MED ORDER — THIAMINE HCL 100 MG PO TABS
100.0000 mg | ORAL_TABLET | Freq: Every day | ORAL | Status: DC
  Administered 2019-11-20 – 2019-11-23 (×3): 100 mg via ORAL
  Filled 2019-11-20 (×4): qty 1

## 2019-11-20 MED ORDER — MAGNESIUM SULFATE 2 GM/50ML IV SOLN
2.0000 g | Freq: Once | INTRAVENOUS | Status: AC
  Administered 2019-11-20: 2 g via INTRAVENOUS
  Filled 2019-11-20: qty 50

## 2019-11-20 MED ORDER — ATENOLOL 25 MG PO TABS
25.0000 mg | ORAL_TABLET | Freq: Every day | ORAL | Status: DC
  Administered 2019-11-21: 25 mg via ORAL
  Filled 2019-11-20 (×2): qty 1

## 2019-11-20 MED ORDER — HEPARIN SODIUM (PORCINE) 5000 UNIT/ML IJ SOLN
5000.0000 [IU] | Freq: Three times a day (TID) | INTRAMUSCULAR | Status: DC
  Administered 2019-11-20 – 2019-11-23 (×8): 5000 [IU] via SUBCUTANEOUS
  Filled 2019-11-20 (×8): qty 1

## 2019-11-20 MED ORDER — SODIUM CHLORIDE 0.9 % IV SOLN: INTRAVENOUS | Status: DC

## 2019-11-20 MED ORDER — LORAZEPAM 2 MG PO TABS
0.0000 mg | ORAL_TABLET | Freq: Two times a day (BID) | ORAL | Status: DC
  Administered 2019-11-23: 1 mg via ORAL
  Filled 2019-11-20: qty 1

## 2019-11-20 MED ORDER — FOLIC ACID 1 MG PO TABS
1.0000 mg | ORAL_TABLET | Freq: Every day | ORAL | Status: DC
  Administered 2019-11-20 – 2019-11-23 (×3): 1 mg via ORAL
  Filled 2019-11-20 (×4): qty 1

## 2019-11-20 MED ORDER — THIAMINE HCL 100 MG/ML IJ SOLN
100.0000 mg | Freq: Every day | INTRAMUSCULAR | Status: DC
  Filled 2019-11-20: qty 2

## 2019-11-20 MED ORDER — ENSURE ENLIVE PO LIQD
237.0000 mL | Freq: Two times a day (BID) | ORAL | Status: DC
  Administered 2019-11-21 – 2019-11-23 (×4): 237 mL via ORAL

## 2019-11-20 MED ORDER — LORAZEPAM 2 MG PO TABS
0.0000 mg | ORAL_TABLET | Freq: Four times a day (QID) | ORAL | Status: AC
  Administered 2019-11-20 – 2019-11-21 (×2): 2 mg via ORAL
  Filled 2019-11-20: qty 2
  Filled 2019-11-20 (×3): qty 1

## 2019-11-20 NOTE — Progress Notes (Signed)
   11/20/19 2218  CIWA-Ar  BP 118/79  Pulse Rate 70  Nausea and Vomiting 0  Tactile Disturbances 0  Tremor 0  Auditory Disturbances 0  Paroxysmal Sweats 0  Visual Disturbances 0  Anxiety 0  Headache, Fullness in Head 0  Agitation 0  Orientation and Clouding of Sensorium 0  CIWA-Ar Total 0  Patient asleep at this time. Will continue to monitor.

## 2019-11-20 NOTE — Consult Note (Signed)
Cardiology Consultation:   Patient ID: Tara Huffman MRN: [id number]; DOB: [birth year]  Admit date: 11/19/2019 Date of Consult: 11/20/2019  Primary Care Provider: Marjie Skiff, NP Primary Cardiologist: New - Tara Huffman Primary Electrophysiologist:  None    Patient Profile:   Tara Huffman is a 79 y.o. female with a hx of hypertension, hyperlipidemia, and dementia who is being seen today for the evaluation of elevated troponin and abnormal EKG at the request of Dr. Debby Bud.  History of Present Illness:   Ms. Tara Huffman was reportedly found down by her family yesterday.  She does not recall specific events nor much of her past medical history.  She reports feeling well at this time, denying current or previous chest pain, shortness of breath, palpitations, and lightheadedness.  In the ER, she was noted to have a mild troponin elevation, which was flat, as well as an elevated creatine kinase of more than 1000.  EKG was concerning for atrial fibrillation and inferior myocardial injury.  Heart Pathway Score:     Past Medical History:  Diagnosis Date  . Allergy   . Dementia (HCC)   . Hyperlipidemia   . Hypertension   . Insomnia   . Insomnia     History reviewed. No pertinent surgical history.   Home Medications:  Prior to Admission medications   Medication Sig Start Date Tara Huffman Date Taking? Authorizing Provider  atenolol (TENORMIN) 50 MG tablet TAKE 1 TABLET (50 MG TOTAL) BY MOUTH DAILY. 07/23/19  Yes Crissman, Redge Gainer, MD  donepezil (ARICEPT) 10 MG tablet TAKE 1 TABLET BY MOUTH EVERY DAY AT NIGHT Patient taking differently: Take 10 mg by mouth at bedtime. TAKE 1 TABLET BY MOUTH EVERY DAY AT NIGHT 10/27/18  Yes Cannady, Jolene T, NP  fluticasone (FLONASE) 50 MCG/ACT nasal spray SPRAY 2 SPRAY IN EACH NOSTRIL DAILY Patient taking differently: Place 2 sprays into both nostrils daily. SPRAY 2 SPRAY IN EACH NOSTRIL DAILY 03/16/19  Yes Crissman, Redge Gainer, MD  QUEtiapine (SEROQUEL) 25 MG tablet TAKE  1 TABLET (25 MG TOTAL) BY MOUTH AT BEDTIME. 08/20/19  Yes Cannady, Jolene T, NP  mirtazapine (REMERON) 30 MG tablet TAKE 1 TABLET BY MOUTH AT BEDTIME FOR SLEEP AND APPETITE Patient not taking: TAKE 1 TABLET BY MOUTH AT BEDTIME FOR SLEEP AND APPETITE 06/13/19   Steele Sizer, MD    Inpatient Medications: Scheduled Meds: . aspirin  324 mg Oral NOW   Or  . aspirin  300 mg Rectal NOW  . aspirin EC  81 mg Oral Daily  . atenolol  50 mg Oral Daily  . atorvastatin  40 mg Oral q1800  . donepezil  10 mg Oral QHS  . folic acid  1 mg Oral Daily  . LORazepam  0-4 mg Oral Q6H   Followed by  . [START ON 11/22/2019] LORazepam  0-4 mg Oral Q12H  . mirtazapine  30 mg Oral QHS  . multivitamin with minerals  1 tablet Oral Daily  . QUEtiapine  25 mg Oral QHS  . thiamine  100 mg Oral Daily   Or  . thiamine  100 mg Intravenous Daily   Continuous Infusions: . sodium chloride 100 mL/hr at 11/20/19 1045  . heparin 550 Units/hr (11/20/19 0938)   PRN Meds: acetaminophen, LORazepam **OR** LORazepam, nitroGLYCERIN, ondansetron (ZOFRAN) IV  Allergies:    Allergies  Allergen Reactions  . Ambien [Zolpidem]     Sleep walking    Social History:   Social History   Tobacco Use  .  Smoking status: Never Smoker  . Smokeless tobacco: Never Used  Substance Use Topics  . Alcohol use: Yes    Alcohol/week: 14.0 standard drinks    Types: 14 Cans of beer per week  . Drug use: No     Family History:   Family History  Problem Relation Age of Onset  . Thyroid disease Sister      ROS:  Please see the history of present illness. All other ROS reviewed and negative.     Physical Exam/Data:   Vitals:   11/20/19 0100 11/20/19 0436 11/20/19 0734 11/20/19 1132  BP: (!) 132/57 (!) 129/49 137/60 (!) 97/53  Pulse: 81 73 78 97  Resp: 20 20 18 18   Temp: 98.1 F (36.7 C) 97.8 F (36.6 C) 98.3 F (36.8 C) 98.6 F (37 C)  TempSrc: Oral Oral Oral Oral  SpO2: 100% 100% 100% 100%  Weight: 51.6 kg     Height:         Intake/Output Summary (Last 24 hours) at 11/20/2019 1207 Last data filed at 11/20/2019 1038 Gross per 24 hour  Intake --  Output 0 ml  Net 0 ml   Last 3 Weights 11/20/2019 11/19/2019 10/27/2018  Weight (lbs) 113 lb 11.2 oz 115 lb 121 lb  Weight (kg) 51.574 kg 52.164 kg 54.885 kg     Body mass index is 18.35 kg/m.  General: Thin, elderly woman, well seated in bed. HEENT: normal Lymph: no adenopathy Neck: no JVD or HJR Endocrine:  No thryomegaly Vascular: No carotid bruits; 2+ radial pulses bilaterally. Cardiac: Regular rate and rhythm without murmurs, rubs, or gallops. Lungs: Mildly diminished breath sounds throughout without wheezes or crackles.  Normal work of breathing.  Abd: soft, nontender, no hepatomegaly  Ext: No lower extremity edema. Musculoskeletal:  No deformities, BUE and BLE strength normal and equal Skin: warm and dry  Neuro: Cranial nerves III through XII intact.  Tremor noted.  Otherwise, no focal abnormality. Psych:  Normal affect   EKG:  The EKG performed this morning at 9:02 AM was personally reviewed and demonstrates: Normal sinus rhythm without significant abnormality.  Tracings performed yesterday were also reviewed and are markedly degraded by artifact, likely related to the patient's tremor.  I suspect underlying rhythm is normal sinus and that there are no significant ST segment abnormalities. Telemetry:  Telemetry was personally reviewed and demonstrates: Sinus rhythm and sinus tachycardia with marked tachycardia with apparent movement.  Heart rates briefly go up to 160 bpm.  Relevant CV Studies: None.  Laboratory Data:  High Sensitivity Troponin:   Recent Labs  Lab 11/19/19 1850 11/19/19 2103 11/20/19 0125 11/20/19 0315  TROPONINIHS 215* 262* 266* 243*     Chemistry Recent Labs  Lab 11/19/19 1850 11/20/19 0707  NA 133* 138  K 4.1 3.5  CL 97* 103  CO2 17* 19*  GLUCOSE 129* 97  BUN 12 12  CREATININE 0.62 0.64  CALCIUM 9.7 9.0  GFRNONAA  >60 >60  GFRAA >60 >60  ANIONGAP 19* 16*    Recent Labs  Lab 11/19/19 1850 11/20/19 0707  PROT 7.8 6.4*  ALBUMIN 4.0 3.2*  AST 68* 60*  ALT 24 22  ALKPHOS 65 49  BILITOT 1.7* 1.0   Hematology Recent Labs  Lab 11/19/19 1850 11/20/19 0125 11/20/19 0707  WBC 10.5 10.5 9.2  RBC 5.03 4.63 4.40  HGB 15.7* 14.4 13.5  HCT 46.9* 43.2 41.1  MCV 93.2 93.3 93.4  MCH 31.2 31.1 30.7  MCHC 33.5 33.3 32.8  RDW 12.3 12.3 12.1  PLT 314 278 262   BNPNo results for input(s): BNP, PROBNP in the last 168 hours.  DDimer No results for input(s): DDIMER in the last 168 hours.   Radiology/Studies:  CT Head Wo Contrast  Result Date: 11/19/2019 CLINICAL DATA:  Recent fall EXAM: CT HEAD WITHOUT CONTRAST CT CERVICAL SPINE WITHOUT CONTRAST TECHNIQUE: Multidetector CT imaging of the head and cervical spine was performed following the standard protocol without intravenous contrast. Multiplanar CT image reconstructions of the cervical spine were also generated. COMPARISON:  None. FINDINGS: CT HEAD FINDINGS Brain: Chronic atrophic changes and chronic white matter ischemic changes are noted. No findings to suggest acute hemorrhage, acute infarction or space-occupying mass lesion are noted. Vascular: No hyperdense vessel or unexpected calcification. Skull: Normal. Negative for fracture or focal lesion. Sinuses/Orbits: No acute finding. Other: None. CT CERVICAL SPINE FINDINGS Alignment: Within normal limits. Skull base and vertebrae: 7 cervical segments are well visualized. Vertebral body height is well maintained. Multilevel facet hypertrophic changes are seen. Mild disc space narrowing is noted primarily at C5-6 with vacuum disc phenomena. No acute fracture or acute facet abnormality is noted. The odontoid is within normal limits. Soft tissues and spinal canal: Surrounding soft tissue structures show no acute abnormality. Upper chest: Visualized lung apices are within normal limits. Other: None IMPRESSION: CT of  the head: Chronic atrophic and ischemic changes without acute abnormality. CT of the cervical spine: Multilevel degenerative change without acute abnormality. Electronically Signed   By: Alcide Clever M.D.   On: 11/19/2019 19:37   CT Cervical Spine Wo Contrast  Result Date: 11/19/2019 CLINICAL DATA:  Recent fall EXAM: CT HEAD WITHOUT CONTRAST CT CERVICAL SPINE WITHOUT CONTRAST TECHNIQUE: Multidetector CT imaging of the head and cervical spine was performed following the standard protocol without intravenous contrast. Multiplanar CT image reconstructions of the cervical spine were also generated. COMPARISON:  None. FINDINGS: CT HEAD FINDINGS Brain: Chronic atrophic changes and chronic white matter ischemic changes are noted. No findings to suggest acute hemorrhage, acute infarction or space-occupying mass lesion are noted. Vascular: No hyperdense vessel or unexpected calcification. Skull: Normal. Negative for fracture or focal lesion. Sinuses/Orbits: No acute finding. Other: None. CT CERVICAL SPINE FINDINGS Alignment: Within normal limits. Skull base and vertebrae: 7 cervical segments are well visualized. Vertebral body height is well maintained. Multilevel facet hypertrophic changes are seen. Mild disc space narrowing is noted primarily at C5-6 with vacuum disc phenomena. No acute fracture or acute facet abnormality is noted. The odontoid is within normal limits. Soft tissues and spinal canal: Surrounding soft tissue structures show no acute abnormality. Upper chest: Visualized lung apices are within normal limits. Other: None IMPRESSION: CT of the head: Chronic atrophic and ischemic changes without acute abnormality. CT of the cervical spine: Multilevel degenerative change without acute abnormality. Electronically Signed   By: Alcide Clever M.D.   On: 11/19/2019 19:37   {  Assessment and Plan:   Elevated troponin and abnormal EKG: Ms. Dumler denies any symptoms including chest pain, shortness of breath,  palpitations, and lightheadedness, though history is limited by underlying dementia.  She appears well on exam without any obvious abnormalities other than tremor and mildly diminished breath sounds throughout.  I reviewed yesterday and today's EKGs which are most consistent with sinus rhythm.  I do not see any obvious atrial fibrillation or significant ST segment abnormalities, though yesterday's tracings are difficult to interpret due to artifact.  Her mildly elevated troponin with flat to slightly downward trend  is nonspecific and not consistent with acute coronary syndrome.  Mild troponin elevations can be seen in the setting of rhabdomyolysis.  Treat rhabdomyolysis, as you are doing.  Obtain transthoracic echocardiogram.  Continue IV hydration, as marked heart rate spikes with minimal activity may be reflective of intravascular volume depletion.  Unless echocardiogram shows significant cardiomyopathy or focal wall motion abnormality, I recommend deferring additional cardiac work-up during this hospitalization.  Ok to continue aspirin and atorvastatin pending aforementioned workup.  I recommend discontinuation of heparin at this time.  Due to soft blood pressure, I will decrease atenolol to 25 mg daily.    For questions or updates, please contact [facility] Please consult [email or web address] for contact info under [facility].  Signed, Yvonne Kendall, MD  11/20/2019 12:07 PM

## 2019-11-20 NOTE — Consult Note (Signed)
ANTICOAGULATION CONSULT NOTE  Pharmacy Consult for Heparin Indication: chest pain/ACS and atrial fibrillation  Allergies  Allergen Reactions  . Ambien [Zolpidem]     Sleep walking    Patient Measurements: Height: 5\' 6"  (167.6 cm) Weight: 113 lb 11.2 oz (51.6 kg) IBW/kg (Calculated) : 59.3 Heparin Dosing Weight: 52.2 kg  Vital Signs: Temp: 98.3 F (36.8 C) (02/02 0734) Temp Source: Oral (02/02 0734) BP: 137/60 (02/02 0734) Pulse Rate: 78 (02/02 0734)  Labs: Recent Labs    11/19/19 1850 11/19/19 1850 11/19/19 2103 11/20/19 0125 11/20/19 0315 11/20/19 0707  HGB 15.7*   < >  --  14.4  --  13.5  HCT 46.9*  --   --  43.2  --  41.1  PLT 314  --   --  278  --  262  APTT  --   --   --  >160*  --   --   LABPROT  --   --   --  14.4  --   --   INR  --   --   --  1.1  --   --   HEPARINUNFRC  --   --   --   --   --  0.71*  CREATININE 0.62  --   --   --   --  0.64  CKTOTAL 1,064*  --   --   --  1,016*  --   TROPONINIHS 215*   < > 262* 266* 243*  --    < > = values in this interval not displayed.    Estimated Creatinine Clearance: 47.2 mL/min (by C-G formula based on SCr of 0.64 mg/dL).   Medical History: Past Medical History:  Diagnosis Date  . Allergy   . Dementia (HCC)   . Hyperlipidemia   . Hypertension   . Insomnia   . Insomnia     Medications:  Medications Prior to Admission  Medication Sig Dispense Refill Last Dose  . atenolol (TENORMIN) 50 MG tablet TAKE 1 TABLET (50 MG TOTAL) BY MOUTH DAILY. 90 tablet 2 Past Week at Unknown time  . donepezil (ARICEPT) 10 MG tablet TAKE 1 TABLET BY MOUTH EVERY DAY AT NIGHT (Patient taking differently: Take 10 mg by mouth at bedtime. TAKE 1 TABLET BY MOUTH EVERY DAY AT NIGHT) 90 tablet 3 Past Week at Unknown time  . fluticasone (FLONASE) 50 MCG/ACT nasal spray SPRAY 2 SPRAY IN EACH NOSTRIL DAILY (Patient taking differently: Place 2 sprays into both nostrils daily. SPRAY 2 SPRAY IN EACH NOSTRIL DAILY) 48 g 1 Past Week at prn  .  QUEtiapine (SEROQUEL) 25 MG tablet TAKE 1 TABLET (25 MG TOTAL) BY MOUTH AT BEDTIME. 90 tablet 3 Past Week at Unknown time  . mirtazapine (REMERON) 30 MG tablet TAKE 1 TABLET BY MOUTH AT BEDTIME FOR SLEEP AND APPETITE (Patient not taking: TAKE 1 TABLET BY MOUTH AT BEDTIME FOR SLEEP AND APPETITE) 90 tablet 1 Not Taking at Unknown time   Scheduled:  . aspirin  324 mg Oral NOW   Or  . aspirin  300 mg Rectal NOW  . aspirin EC  81 mg Oral Daily  . atenolol  50 mg Oral Daily  . atorvastatin  40 mg Oral q1800  . donepezil  10 mg Oral QHS  . folic acid  1 mg Oral Daily  . LORazepam  0-4 mg Oral Q6H   Followed by  . [START ON 11/22/2019] LORazepam  0-4 mg Oral Q12H  . mirtazapine  30 mg Oral QHS  . multivitamin with minerals  1 tablet Oral Daily  . QUEtiapine  25 mg Oral QHS  . thiamine  100 mg Oral Daily   Or  . thiamine  100 mg Intravenous Daily   Infusions:  . heparin 600 Units/hr (11/20/19 0105)   PRN: acetaminophen, LORazepam **OR** LORazepam, nitroGLYCERIN, ondansetron (ZOFRAN) IV Anti-infectives (From admission, onward)   None      Assessment: Pharmacy consulted to start heparin for new afib and possible MI. No prior DOAC PTA noted. No initial heparin note written. Looks like pt was given heparin bolus 3000 units x 1 and started on heparin infusion at 600 units/hr.   2/2 0707  HL 0.71 - drawn slightly early.   Goal of Therapy:  Heparin level 0.3-0.7 units/ml Monitor platelets by anticoagulation protocol: Yes   Plan:  Heparin level is slightly supratherapeutic. Will decrease infusion to 550 units/hr. Will order a heparin level in 8 hours. Daily CBC while on heparin. CBC stable.   Oswald Hillock, PharmD, BCPS 11/20/2019,8:28 AM

## 2019-11-20 NOTE — Progress Notes (Signed)
Subjective: Chest pain, shortness of breath or palpitation.  Sitting on bed was having breakfast this morning.  Appears a little slow.  Objective: Vital signs in last 24 hours: Temp:  [97.6 F (36.4 C)-98.6 F (37 C)] 98.6 F (37 C) (02/02 1132) Pulse Rate:  [69-97] 97 (02/02 1132) Resp:  [12-20] 18 (02/02 1132) BP: (97-160)/(49-85) 97/53 (02/02 1132) SpO2:  [99 %-100 %] 100 % (02/02 1132) Weight:  [51.6 kg-52.2 kg] 51.6 kg (02/02 0100)  Intake/Output from previous day: No intake/output data recorded. Intake/Output this shift: No intake/output data recorded.  General:- WNWD elderly woman in no distress or pain. Eyes: PERRL, lids and conjunctivae normal ENMT: Mucous membranes are moist. Posterior pharynx clear of any exudate or lesions.Normal dentition.  Neck: normal, stiff but with active movement, no masses, no thyromegaly Respiratory: clear to auscultation bilaterally, no wheezing, no crackles. Normal respiratory effort. No accessory muscle use.  Cardiovascular:  Regular rate and rhythm,  no murmurs / rubs / gallops. No extremity edema. 2+ pedal pulses. No carotid bruits.  Abdomen: no tenderness, no masses palpated. No hepatosplenomegaly. Bowel sounds positive.  Musculoskeletal: no clubbing / cyanosis. No joint deformity upper and lower extremities. Good ROM, no contractures. Normal muscle tone.  Skin: no rashes, lesions, ulcers. No induration Neurologic: CN 2-12 grossly intact. Sensation intact. Strength 5/5 in all 4. Tremulous Psychiatric: Poor judgement. Alert and oriented x 3. Normal mood.   Results for orders placed or performed during the hospital encounter of 11/19/19 (from the past 24 hour(s))  CBC with Differential     Status: Abnormal   Collection Time: 11/19/19  6:50 PM  Result Value Ref Range   WBC 10.5 4.0 - 10.5 K/uL   RBC 5.03 3.87 - 5.11 MIL/uL   Hemoglobin 15.7 (H) 12.0 - 15.0 g/dL   HCT 16.1 (H) 09.6 - 04.5 %   MCV 93.2 80.0 - 100.0 fL   MCH 31.2 26.0 -  34.0 pg   MCHC 33.5 30.0 - 36.0 g/dL   RDW 40.9 81.1 - 91.4 %   Platelets 314 150 - 400 K/uL   nRBC 0.0 0.0 - 0.2 %   Neutrophils Relative % 85 %   Neutro Abs 9.0 (H) 1.7 - 7.7 K/uL   Lymphocytes Relative 6 %   Lymphs Abs 0.6 (L) 0.7 - 4.0 K/uL   Monocytes Relative 8 %   Monocytes Absolute 0.8 0.1 - 1.0 K/uL   Eosinophils Relative 0 %   Eosinophils Absolute 0.0 0.0 - 0.5 K/uL   Basophils Relative 0 %   Basophils Absolute 0.0 0.0 - 0.1 K/uL   Immature Granulocytes 1 %   Abs Immature Granulocytes 0.07 0.00 - 0.07 K/uL  Comprehensive metabolic panel     Status: Abnormal   Collection Time: 11/19/19  6:50 PM  Result Value Ref Range   Sodium 133 (L) 135 - 145 mmol/L   Potassium 4.1 3.5 - 5.1 mmol/L   Chloride 97 (L) 98 - 111 mmol/L   CO2 17 (L) 22 - 32 mmol/L   Glucose, Bld 129 (H) 70 - 99 mg/dL   BUN 12 8 - 23 mg/dL   Creatinine, Ser 7.82 0.44 - 1.00 mg/dL   Calcium 9.7 8.9 - 95.6 mg/dL   Total Protein 7.8 6.5 - 8.1 g/dL   Albumin 4.0 3.5 - 5.0 g/dL   AST 68 (H) 15 - 41 U/L   ALT 24 0 - 44 U/L   Alkaline Phosphatase 65 38 - 126 U/L   Total Bilirubin  1.7 (H) 0.3 - 1.2 mg/dL   GFR calc non Af Amer >60 >60 mL/min   GFR calc Af Amer >60 >60 mL/min   Anion gap 19 (H) 5 - 15  CK     Status: Abnormal   Collection Time: 11/19/19  6:50 PM  Result Value Ref Range   Total CK 1,064 (H) 38 - 234 U/L  Troponin I (High Sensitivity)     Status: Abnormal   Collection Time: 11/19/19  6:50 PM  Result Value Ref Range   Troponin I (High Sensitivity) 215 (HH) <18 ng/L  Troponin I (High Sensitivity)     Status: Abnormal   Collection Time: 11/19/19  9:03 PM  Result Value Ref Range   Troponin I (High Sensitivity) 262 (HH) <18 ng/L  APTT     Status: Abnormal   Collection Time: 11/20/19  1:25 AM  Result Value Ref Range   aPTT >160 (HH) 24 - 36 seconds  Protime-INR     Status: None   Collection Time: 11/20/19  1:25 AM  Result Value Ref Range   Prothrombin Time 14.4 11.4 - 15.2 seconds   INR  1.1 0.8 - 1.2  Troponin I (High Sensitivity)     Status: Abnormal   Collection Time: 11/20/19  1:25 AM  Result Value Ref Range   Troponin I (High Sensitivity) 266 (HH) <18 ng/L  Magnesium     Status: None   Collection Time: 11/20/19  1:25 AM  Result Value Ref Range   Magnesium 1.8 1.7 - 2.4 mg/dL  Phosphorus     Status: None   Collection Time: 11/20/19  1:25 AM  Result Value Ref Range   Phosphorus 4.3 2.5 - 4.6 mg/dL  CBC     Status: None   Collection Time: 11/20/19  1:25 AM  Result Value Ref Range   WBC 10.5 4.0 - 10.5 K/uL   RBC 4.63 3.87 - 5.11 MIL/uL   Hemoglobin 14.4 12.0 - 15.0 g/dL   HCT 58.0 99.8 - 33.8 %   MCV 93.3 80.0 - 100.0 fL   MCH 31.1 26.0 - 34.0 pg   MCHC 33.3 30.0 - 36.0 g/dL   RDW 25.0 53.9 - 76.7 %   Platelets 278 150 - 400 K/uL   nRBC 0.0 0.0 - 0.2 %  CK     Status: Abnormal   Collection Time: 11/20/19  3:15 AM  Result Value Ref Range   Total CK 1,016 (H) 38 - 234 U/L  Troponin I (High Sensitivity)     Status: Abnormal   Collection Time: 11/20/19  3:15 AM  Result Value Ref Range   Troponin I (High Sensitivity) 243 (HH) <18 ng/L  Heparin level (unfractionated)     Status: Abnormal   Collection Time: 11/20/19  7:07 AM  Result Value Ref Range   Heparin Unfractionated 0.71 (H) 0.30 - 0.70 IU/mL  CBC     Status: None   Collection Time: 11/20/19  7:07 AM  Result Value Ref Range   WBC 9.2 4.0 - 10.5 K/uL   RBC 4.40 3.87 - 5.11 MIL/uL   Hemoglobin 13.5 12.0 - 15.0 g/dL   HCT 34.1 93.7 - 90.2 %   MCV 93.4 80.0 - 100.0 fL   MCH 30.7 26.0 - 34.0 pg   MCHC 32.8 30.0 - 36.0 g/dL   RDW 40.9 73.5 - 32.9 %   Platelets 262 150 - 400 K/uL   nRBC 0.0 0.0 - 0.2 %  Comprehensive metabolic panel  Status: Abnormal   Collection Time: 11/20/19  7:07 AM  Result Value Ref Range   Sodium 138 135 - 145 mmol/L   Potassium 3.5 3.5 - 5.1 mmol/L   Chloride 103 98 - 111 mmol/L   CO2 19 (L) 22 - 32 mmol/L   Glucose, Bld 97 70 - 99 mg/dL   BUN 12 8 - 23 mg/dL    Creatinine, Ser 0.64 0.44 - 1.00 mg/dL   Calcium 9.0 8.9 - 10.3 mg/dL   Total Protein 6.4 (L) 6.5 - 8.1 g/dL   Albumin 3.2 (L) 3.5 - 5.0 g/dL   AST 60 (H) 15 - 41 U/L   ALT 22 0 - 44 U/L   Alkaline Phosphatase 49 38 - 126 U/L   Total Bilirubin 1.0 0.3 - 1.2 mg/dL   GFR calc non Af Amer >60 >60 mL/min   GFR calc Af Amer >60 >60 mL/min   Anion gap 16 (H) 5 - 15  Magnesium     Status: None   Collection Time: 11/20/19  7:07 AM  Result Value Ref Range   Magnesium 1.8 1.7 - 2.4 mg/dL    Studies/Results: CT Head Wo Contrast  Result Date: 11/19/2019 CLINICAL DATA:  Recent fall EXAM: CT HEAD WITHOUT CONTRAST CT CERVICAL SPINE WITHOUT CONTRAST TECHNIQUE: Multidetector CT imaging of the head and cervical spine was performed following the standard protocol without intravenous contrast. Multiplanar CT image reconstructions of the cervical spine were also generated. COMPARISON:  None. FINDINGS: CT HEAD FINDINGS Brain: Chronic atrophic changes and chronic white matter ischemic changes are noted. No findings to suggest acute hemorrhage, acute infarction or space-occupying mass lesion are noted. Vascular: No hyperdense vessel or unexpected calcification. Skull: Normal. Negative for fracture or focal lesion. Sinuses/Orbits: No acute finding. Other: None. CT CERVICAL SPINE FINDINGS Alignment: Within normal limits. Skull base and vertebrae: 7 cervical segments are well visualized. Vertebral body height is well maintained. Multilevel facet hypertrophic changes are seen. Mild disc space narrowing is noted primarily at C5-6 with vacuum disc phenomena. No acute fracture or acute facet abnormality is noted. The odontoid is within normal limits. Soft tissues and spinal canal: Surrounding soft tissue structures show no acute abnormality. Upper chest: Visualized lung apices are within normal limits. Other: None IMPRESSION: CT of the head: Chronic atrophic and ischemic changes without acute abnormality. CT of the cervical  spine: Multilevel degenerative change without acute abnormality. Electronically Signed   By: Inez Catalina M.D.   On: 11/19/2019 19:37   CT Cervical Spine Wo Contrast  Result Date: 11/19/2019 CLINICAL DATA:  Recent fall EXAM: CT HEAD WITHOUT CONTRAST CT CERVICAL SPINE WITHOUT CONTRAST TECHNIQUE: Multidetector CT imaging of the head and cervical spine was performed following the standard protocol without intravenous contrast. Multiplanar CT image reconstructions of the cervical spine were also generated. COMPARISON:  None. FINDINGS: CT HEAD FINDINGS Brain: Chronic atrophic changes and chronic white matter ischemic changes are noted. No findings to suggest acute hemorrhage, acute infarction or space-occupying mass lesion are noted. Vascular: No hyperdense vessel or unexpected calcification. Skull: Normal. Negative for fracture or focal lesion. Sinuses/Orbits: No acute finding. Other: None. CT CERVICAL SPINE FINDINGS Alignment: Within normal limits. Skull base and vertebrae: 7 cervical segments are well visualized. Vertebral body height is well maintained. Multilevel facet hypertrophic changes are seen. Mild disc space narrowing is noted primarily at C5-6 with vacuum disc phenomena. No acute fracture or acute facet abnormality is noted. The odontoid is within normal limits. Soft tissues and spinal canal: Surrounding  soft tissue structures show no acute abnormality. Upper chest: Visualized lung apices are within normal limits. Other: None IMPRESSION: CT of the head: Chronic atrophic and ischemic changes without acute abnormality. CT of the cervical spine: Multilevel degenerative change without acute abnormality. Electronically Signed   By: Alcide Clever M.D.   On: 11/19/2019 19:37    Scheduled Meds: . aspirin  324 mg Oral NOW   Or  . aspirin  300 mg Rectal NOW  . aspirin EC  81 mg Oral Daily  . [START ON 11/21/2019] atenolol  25 mg Oral Daily  . atorvastatin  40 mg Oral q1800  . donepezil  10 mg Oral QHS  .  folic acid  1 mg Oral Daily  . heparin injection (subcutaneous)  5,000 Units Subcutaneous Q8H  . LORazepam  0-4 mg Oral Q6H   Followed by  . [START ON 11/22/2019] LORazepam  0-4 mg Oral Q12H  . mirtazapine  30 mg Oral QHS  . multivitamin with minerals  1 tablet Oral Daily  . QUEtiapine  25 mg Oral QHS  . thiamine  100 mg Oral Daily   Or  . thiamine  100 mg Intravenous Daily   Continuous Infusions: . sodium chloride 100 mL/hr at 11/20/19 1045   PRN Meds:acetaminophen, LORazepam **OR** LORazepam, nitroGLYCERIN, ondansetron (ZOFRAN) IV  Assessment/Plan:  1. Rhabdomyolysis -still above 1000  - patient was immobile in bath tube for indeterminate period of time.  Admission CK > 1,000 -Continue IV hydration -Recheck CPK -Monitor electrolytes  2.  Elevated troponin: In 200s -Heart rate in normal sinus rhythm now -Cardiology consulted.  Does not think any cardiac origin.  Follow 2D echo and if any acute findings may warrant intervention. -Appreciate cardio recommendation -2D echo pending -Continue beta-blocker, ASA  3. HTN- BP stable - continue home meds  4. Dementia - per family she is "Ornery" but does take some of her meds. She also drinks at least a 6 pack every day. - Continue home meds -  AIWA protocol; signs of withdrawal at this point  DVT prophylaxis: heparin subcu Code Status: full code   Family Communication:  Meeting Dr. Sherron Monday with daughter who was present during exam on admission  Disposition Plan: To be determined - recommended that patient not live independently   -OT consult Consults called: Cardiology - spoke with Dr. Jens Som for [facility]    LOS: 1 day   Thomasenia Bottoms

## 2019-11-20 NOTE — Progress Notes (Signed)
Initial Nutrition Assessment  DOCUMENTATION CODES:   Underweight  INTERVENTION:   Ensure Enlive po BID, each supplement provides 350 kcal and 20 grams of protein  MVI, thiamine and folic acid in setting of etoh abuse   Liberalize diet   Pt likely at moderate refeed risk; recommend monitor K, Mg and P labs daily as oral intake improves.   NUTRITION DIAGNOSIS:   Predicted suboptimal nutrient intake related to chronic illness(dementia, etoh abuse) as evidenced by other (comment)(pt is underweight).  GOAL:   Patient will meet greater than or equal to 90% of their needs  MONITOR:   PO intake, Supplement acceptance, Labs, I & O's, Skin, Weight trends  REASON FOR ASSESSMENT:   Other (Comment)(low BMI)    ASSESSMENT:   79 y.o. female with medical history significant of h/o ETOH abuse, HTN, HDL, dementia  presents to the emergency department today after being found down by family.   Unable to reach pt by phone. Suspect pt with poor appetite and oral intake at baseline r/t etoh abuse and dementia as pt is underweight. RD will add supplements to help pt meet her estimated needs. RD will also liberalize pt's diet as a heart healthy diet is restrictive pf protein. Pt is at refeed risk. Per chart, pt down 7lbs(6%) over the past year; RD unsure how recently weight loss occurred.    Pt likely at high risk for malnutrition but unable to diagnose at this time as NFPE cannot be performed.   Medications reviewed and include: aspirin, folic acid, heparin, thiamine, MVI, remeron, NaCl @125ml /hr  Labs reviewed:   Unable to complete Nutrition-Focused physical exam at this time.   Diet Order:   Diet Order            Diet Heart Room service appropriate? Yes; Fluid consistency: Thin  Diet effective now             EDUCATION NEEDS:   Not appropriate for education at this time  Skin:  Skin Assessment: Reviewed RN Assessment  Last BM:  pta  Height:   Ht Readings from Last 1  Encounters:  11/19/19 5\' 6"  (1.676 m)    Weight:   Wt Readings from Last 1 Encounters:  11/20/19 51.6 kg    Ideal Body Weight:  59 kg  BMI:  Body mass index is 18.35 kg/m.  Estimated Nutritional Needs:   Kcal:  1400-1600kcal/day  Protein:  75-85g/day  Fluid:  >1.3L/day  MS, RD, LDN Pager #- [phone] Office#- [phone] After Hours Pager: [phone]

## 2019-11-21 ENCOUNTER — Inpatient Hospital Stay (HOSPITAL_COMMUNITY)
Admit: 2019-11-21 | Discharge: 2019-11-21 | Disposition: A | Discharge status: Home / Self Care | Payer: Medicare HMO | Attending: Internal Medicine | Admitting: Internal Medicine

## 2019-11-21 DIAGNOSIS — W19XXXD Unspecified fall, subsequent encounter: Secondary | ICD-10-CM

## 2019-11-21 DIAGNOSIS — I361 Nonrheumatic tricuspid (valve) insufficiency: Secondary | ICD-10-CM

## 2019-11-21 LAB — BASIC METABOLIC PANEL
Anion gap: 9 (ref 5–15)
BUN: 8 mg/dL (ref 8–23)
CO2: 22 mmol/L (ref 22–32)
Calcium: 8 mg/dL — ABNORMAL LOW (ref 8.9–10.3)
Chloride: 108 mmol/L (ref 98–111)
Creatinine, Ser: 0.39 mg/dL — ABNORMAL LOW (ref 0.44–1.00)
GFR calc Af Amer: >60 mL/min (ref >60)
GFR calc non Af Amer: >60 mL/min (ref >60)
Glucose, Bld: 84 mg/dL (ref 70–99)
Potassium: 3.5 mmol/L (ref 3.5–5.1)
Sodium: 139 mmol/L (ref 135–145)

## 2019-11-21 LAB — CBC
HCT: 37.6 % (ref 36.0–46.0)
Hemoglobin: 12.1 g/dL (ref 12.0–15.0)
MCH: 31.2 pg (ref 26.0–34.0)
MCHC: 32.2 g/dL (ref 30.0–36.0)
MCV: 96.9 fL (ref 80.0–100.0)
Platelets: 206 10*3/uL (ref 150–400)
RBC: 3.88 MIL/uL (ref 3.87–5.11)
RDW: 12.6 % (ref 11.5–15.5)
WBC: 5.7 10*3/uL (ref 4.0–10.5)
nRBC: 0 % (ref 0.0–0.2)

## 2019-11-21 LAB — ECHOCARDIOGRAM COMPLETE
Height: 66 in
Weight: 1929.6 oz

## 2019-11-21 LAB — CK: Total CK: 395 U/L — ABNORMAL HIGH (ref 38–234)

## 2019-11-21 NOTE — Evaluation (Signed)
Physical Therapy Evaluation Patient Details Name: Tara Huffman MRN: [id number] DOB: May 08, 1941 Today's Date: 11/21/2019   History of Present Illness  Patient is 79  yo female found down by her family, unclear how long. PMH of HTN, HLD, dementia, etoh abuse, Per MD elevated troponin due to rhabdomyolysis.    Clinical Impression  Patient found with arms hanging over bed rail, gown off. PT assisted in repositioning patient, oriented to self only. Some restless behavior noted during session (picking at gown, telemetry lines), easily re-directable and followed one step commands consistently. Pt questionable historian but did report living alone, previously independent in ADLs, denies falls.   The patient performed supine to sit with maxA. 1-2 instances of demonstrating sitting balance with CGA, majority of time needed minA and assistance to utilize UE for propping. Sit <> Stand with RW and maxA attempted, pt unable to come fully to standing, fatigued quickly. Returned to supine with modA for LE management.  Pt closed eyes and seemed calm at end of session. Overall the patient demonstrated deficits (see "PT Problem List") that impede the patient's functional abilities, safety, and mobility and would benefit from skilled PT intervention. Recommendation is SNF due to acute decline in functional status and current level of assistance needed.     Follow Up Recommendations SNF    Equipment Recommendations  Other (comment)(TBD at next venue of care)    Recommendations for Other Services       Precautions / Restrictions Precautions Precautions: Fall Restrictions Weight Bearing Restrictions: No      Mobility  Bed Mobility Overal bed mobility: Needs Assistance Bed Mobility: Supine to Sit;Sit to Supine     Supine to sit: Max assist;HOB elevated Sit to supine: Mod assist   General bed mobility comments: returning to supine pt able to control trunk, assistance needed for complete LE  movement  Transfers Overall transfer level: Needs assistance Equipment used: Rolling walker (2 wheeled) Transfers: Sit to/from Stand Sit to Stand: Max assist         General transfer comment: Pt unable to come to fully standing despite maxA and RW. fatigued quickly  Ambulation/Gait             General Gait Details: deferred due to safety concerns  Stairs            Wheelchair Mobility    Modified Rankin (Stroke Patients Only)       Balance Overall balance assessment: Needs assistance Sitting-balance support: Feet supported;Bilateral upper extremity supported Sitting balance-Leahy Scale: Poor Sitting balance - Comments: 1-2 instances of maintaining balance with CGA, otherwise constant minA and tacile cues/physical assist to continue UE propping     Standing balance-Leahy Scale: Zero                               Pertinent Vitals/Pain Pain Assessment: Faces Faces Pain Scale: No hurt    Home Living Family/patient expects to be discharged to:: Private residence Living Arrangements: Alone Available Help at Discharge: Family;Available PRN/intermittently Type of Home: House Home Access: Stairs to enter Entrance Stairs-Rails: Right;Left;Can reach both Entrance Stairs-Number of Steps: 4 Home Layout: Two level;Bed/bath upstairs Home Equipment: Walker - 2 wheels Additional Comments: denies any falls. Pt questionable historian    Prior Function Level of Independence: Needs assistance   Gait / Transfers Assistance Needed: Pt "thinks" she uses a walker  ADL's / Homemaking Assistance Needed: pt reported performing ADLs without assistance  Hand Dominance        Extremity/Trunk Assessment   Upper Extremity Assessment Upper Extremity Assessment: Difficult to assess due to impaired cognition    Lower Extremity Assessment Lower Extremity Assessment: Difficult to assess due to impaired cognition    Cervical / Trunk  Assessment Cervical / Trunk Assessment: Kyphotic  Communication   Communication: No difficulties  Cognition Arousal/Alertness: Awake/alert Behavior During Therapy: Restless Overall Cognitive Status: No family/caregiver present to determine baseline cognitive functioning                                 General Comments: Pt oriented to self only. followed one step commands consistently. Flucuating mental status; seemed to provide correct PLOF, unaware of current situation.      General Comments      Exercises     Assessment/Plan    PT Assessment Patient needs continued PT services  PT Problem List Decreased strength;Decreased mobility;Decreased safety awareness;Decreased range of motion;Decreased balance;Decreased knowledge of use of DME       PT Treatment Interventions DME instruction;Therapeutic exercise;Gait training;Balance training;Stair training;Neuromuscular re-education;Functional mobility training;Therapeutic activities;Patient/family education    PT Goals (Current goals can be found in the Care Plan section)  Acute Rehab PT Goals Patient Stated Goal: to rest PT Goal Formulation: With patient Time For Goal Achievement: 12/05/19 Potential to Achieve Goals: Good    Frequency Min 2X/week   Barriers to discharge Decreased caregiver support;Inaccessible home environment      Co-evaluation               AM-PAC PT "6 Clicks" Mobility  Outcome Measure Help needed turning from your back to your side while in a flat bed without using bedrails?: A Lot Help needed moving from lying on your back to sitting on the side of a flat bed without using bedrails?: A Lot Help needed moving to and from a bed to a chair (including a wheelchair)?: Total Help needed standing up from a chair using your arms (e.g., wheelchair or bedside chair)?: Total Help needed to walk in hospital room?: Total Help needed climbing 3-5 steps with a railing? : Total 6 Click Score:  8    End of Session Equipment Utilized During Treatment: Gait belt Activity Tolerance: Patient limited by fatigue Patient left: in bed;with call bell/phone within reach;with bed alarm set Nurse Communication: Mobility status PT Visit Diagnosis: Muscle weakness (generalized) (M62.81);Difficulty in walking, not elsewhere classified (R26.2);Other abnormalities of gait and mobility (R26.89)    Time: 1761-6073 PT Time Calculation (min) (ACUTE ONLY): 17 min   Charges:   PT Evaluation $PT Eval Low Complexity: 1 Low PT Treatments $Therapeutic Activity: 8-22 mins        Olga Coaster PT, DPT 5:43 PM,11/21/19

## 2019-11-21 NOTE — Progress Notes (Signed)
1        [facility] [place] NAME: Tara Huffman    MR#:  [id number]  DATE OF BIRTH:  [birth year]  SUBJECTIVE:  CHIEF COMPLAINT:   Chief Complaint  Patient presents with  . Fall  Patient was sleepy after receiving Ativan last night as she was agitated per nursing REVIEW OF SYSTEMS:  ROS unable to obtain due to patient's mental status DRUG ALLERGIES:   Allergies  Allergen Reactions  . Ambien [Zolpidem]     Sleep walking   VITALS:  Blood pressure (!) 154/57, pulse 65, temperature 98.3 F (36.8 C), temperature source Oral, resp. rate 18, height 5\' 6"  (1.676 m), weight 54.7 kg, last menstrual period 05/20/1994, SpO2 100 %. PHYSICAL EXAMINATION:  Physical Exam HENT:     Head: Normocephalic and atraumatic.  Eyes:     Conjunctiva/sclera: Conjunctivae normal.     Pupils: Pupils are equal, round, and reactive to light.  Neck:     Thyroid: No thyromegaly.     Trachea: No tracheal deviation.  Cardiovascular:     Rate and Rhythm: Normal rate and regular rhythm.     Heart sounds: Normal heart sounds.  Pulmonary:     Effort: Pulmonary effort is normal. No respiratory distress.     Breath sounds: Normal breath sounds. No wheezing.  Chest:     Chest wall: No tenderness.  Abdominal:     General: Bowel sounds are normal. There is no distension.     Palpations: Abdomen is soft.     Tenderness: There is no abdominal tenderness.  Musculoskeletal:        General: Normal range of motion.     Cervical back: Normal range of motion and neck supple.  Skin:    General: Skin is warm and dry.     Findings: No rash.  Neurological:     Mental Status: Mental status is at baseline. She is disoriented and confused.     Cranial Nerves: No cranial nerve deficit.     Comments: Unable to obtain as she is sedated status post Ativan last night but seems nonfocal  Psychiatric:     Comments: Unable to evaluate as she is sleepy    LABORATORY PANEL:  Female CBC Recent Labs    Lab 11/21/19 0831  WBC 5.7  HGB 12.1  HCT 37.6  PLT 206   ------------------------------------------------------------------------------------------------------------------ Chemistries  Recent Labs  Lab 11/20/19 0707 11/20/19 0707 11/21/19 0831  NA 138   < > 139  K 3.5   < > 3.5  CL 103   < > 108  CO2 19*   < > 22  GLUCOSE 97   < > 84  BUN 12   < > 8  CREATININE 0.64   < > 0.39*  CALCIUM 9.0   < > 8.0*  MG 1.8  --   --   AST 60*  --   --   ALT 22  --   --   ALKPHOS 49  --   --   BILITOT 1.0  --   --    < > = values in this interval not displayed.   RADIOLOGY:  No results found. ASSESSMENT AND PLAN:   1. Rhabdomyolysis -CK above 1000  on admission -> 395 - patient was immobile in bath tube for indeterminate period of time. -Continue IV hydration -Recheck CPK in the morning -Monitor electrolytes  2.  Elevated troponin:  Due to rhabdomyolysis -No cardiac symptoms such  as chest pain, shortness of breath. -EKG shows normal sinus rhythm -No further cardiac work-up is scheduled unless echocardiogram shows significant cardiomyopathy per cardiology  3. HTN- BP stable - continue home meds  4. Dementia - per family she is "Ornery" but does take some of her meds. She also drinks at least a 6 pack every day. - Continue home meds -  AIWA protocol; no signs of withdrawal at this point Patient received Ativan last night for agitation so currently sleepy      DVT prophylaxis: Heparin subcu Family Communication: No family at bedside - will call her Tara Huffman at [phone] to discuss disposition plan Disposition Plan:  Came from -Home Likely discharge back home with support/supervision Barriers to DC -improvement in her CK and mental status.  Await PT/OT evaluation  All the records are reviewed and case discussed with Care Management/Social Worker. Management plans discussed with the patient, nursing and they are in agreement.  CODE STATUS: Full  Code  TOTAL TIME TAKING CARE OF THIS PATIENT: 35 minutes.   More than 50% of the time was spent in counseling/coordination of care: YES  POSSIBLE D/C IN 1-2 DAYS, DEPENDING ON CLINICAL CONDITION.   Delfino Lovett M.D on 11/21/2019 at 4:31 PM  [facility]   CC: Primary care physician; Marjie Skiff, NP  Note: This dictation was prepared with Dragon dictation along with smaller phrase technology. Any transcriptional errors that result from this process are unintentional.

## 2019-11-21 NOTE — Progress Notes (Signed)
*  PRELIMINARY RESULTS* Echocardiogram 2D Echocardiogram has been performed.  Tara Huffman Tara Huffman 11/21/2019, 9:49 AM

## 2019-11-21 NOTE — Progress Notes (Signed)
Rounding on patient at this time. Patient incontinent of urine. This nurse and NT clean an change. Patient denies pain or discomfort, no needs voiced.  Patient resting peacefully. Will continue to monitor.

## 2019-11-21 NOTE — Plan of Care (Signed)
  Problem: Safety: Goal: Ability to remain free from injury will improve Outcome: Progressing   

## 2019-11-21 NOTE — Progress Notes (Addendum)
Progress Note  Patient Name: Tara Huffman Date of Encounter: 11/21/2019  Primary Cardiologist: [facility]  Subjective   No acute events overnight.  Denies chest pain or shortness of breath  Inpatient Medications    Scheduled Meds:  aspirin EC  81 mg Oral Daily   atenolol  25 mg Oral Daily   atorvastatin  40 mg Oral q1800   donepezil  10 mg Oral QHS   feeding supplement (ENSURE ENLIVE)  237 mL Oral BID BM   folic acid  1 mg Oral Daily   heparin injection (subcutaneous)  5,000 Units Subcutaneous Q8H   LORazepam  0-4 mg Oral Q6H   Followed by   Melene Muller ON 11/22/2019] LORazepam  0-4 mg Oral Q12H   mirtazapine  30 mg Oral QHS   multivitamin with minerals  1 tablet Oral Daily   QUEtiapine  25 mg Oral QHS   thiamine  100 mg Oral Daily   Or   thiamine  100 mg Intravenous Daily   Continuous Infusions:  sodium chloride 125 mL/hr at 11/21/19 0149   PRN Meds: acetaminophen, LORazepam **OR** LORazepam, nitroGLYCERIN, ondansetron (ZOFRAN) IV   Vital Signs    Vitals:   11/20/19 2215 11/20/19 2218 11/21/19 0342 11/21/19 0952  BP: (!) 119/46 118/79 (!) 133/56 (!) 135/51  Pulse: 67 70 71 72  Resp:   20 18  Temp:   97.7 F (36.5 C)   TempSrc:   Oral   SpO2: 99%  99% 100%  Weight:   54.7 kg   Height:        Intake/Output Summary (Last 24 hours) at 11/21/2019 1332 Last data filed at 11/21/2019 0149 Gross per 24 hour  Intake 1799.67 ml  Output --  Net 1799.67 ml   Last 3 Weights 11/21/2019 11/20/2019 11/19/2019  Weight (lbs) 120 lb 9.6 oz 113 lb 11.2 oz 115 lb  Weight (kg) 54.704 kg 51.574 kg 52.164 kg      Telemetry    Sinus rhythm- Personally Reviewed  ECG      Physical Exam   GEN:  Mild shivers noted during my exam Neck: No JVD Cardiac: RRR, no murmurs, rubs, or gallops.  Respiratory: Clear to auscultation bilaterally. GI: Soft, nontender, non-distended  MS: No edema; No deformity. Neuro:  Nonfocal  Psych: Does not follow commands  appropriately  Labs    High Sensitivity Troponin:   Recent Labs  Lab 11/19/19 1850 11/19/19 2103 11/20/19 0125 11/20/19 0315  TROPONINIHS 215* 262* 266* 243*      Chemistry Recent Labs  Lab 11/19/19 1850 11/20/19 0707 11/21/19 0831  NA 133* 138 139  K 4.1 3.5 3.5  CL 97* 103 108  CO2 17* 19* 22  GLUCOSE 129* 97 84  BUN 12 12 8   CREATININE 0.62 0.64 0.39*  CALCIUM 9.7 9.0 8.0*  PROT 7.8 6.4*  --   ALBUMIN 4.0 3.2*  --   AST 68* 60*  --   ALT 24 22  --   ALKPHOS 65 49  --   BILITOT 1.7* 1.0  --   GFRNONAA >60 >60 >60  GFRAA >60 >60 >60  ANIONGAP 19* 16* 9     Hematology Recent Labs  Lab 11/20/19 0125 11/20/19 0707 11/21/19 0831  WBC 10.5 9.2 5.7  RBC 4.63 4.40 3.88  HGB 14.4 13.5 12.1  HCT 43.2 41.1 37.6  MCV 93.3 93.4 96.9  MCH 31.1 30.7 31.2  MCHC 33.3 32.8 32.2  RDW 12.3 12.1 12.6  PLT 278  262 206    BNPNo results for input(s): BNP, PROBNP in the last 168 hours.   DDimer No results for input(s): DDIMER in the last 168 hours.   Radiology    CT Head Wo Contrast  Result Date: 11/19/2019 CLINICAL DATA:  Recent fall EXAM: CT HEAD WITHOUT CONTRAST CT CERVICAL SPINE WITHOUT CONTRAST TECHNIQUE: Multidetector CT imaging of the head and cervical spine was performed following the standard protocol without intravenous contrast. Multiplanar CT image reconstructions of the cervical spine were also generated. COMPARISON:  None. FINDINGS: CT HEAD FINDINGS Brain: Chronic atrophic changes and chronic white matter ischemic changes are noted. No findings to suggest acute hemorrhage, acute infarction or space-occupying mass lesion are noted. Vascular: No hyperdense vessel or unexpected calcification. Skull: Normal. Negative for fracture or focal lesion. Sinuses/Orbits: No acute finding. Other: None. CT CERVICAL SPINE FINDINGS Alignment: Within normal limits. Skull base and vertebrae: 7 cervical segments are well visualized. Vertebral body height is well maintained.  Multilevel facet hypertrophic changes are seen. Mild disc space narrowing is noted primarily at C5-6 with vacuum disc phenomena. No acute fracture or acute facet abnormality is noted. The odontoid is within normal limits. Soft tissues and spinal canal: Surrounding soft tissue structures show no acute abnormality. Upper chest: Visualized lung apices are within normal limits. Other: None IMPRESSION: CT of the head: Chronic atrophic and ischemic changes without acute abnormality. CT of the cervical spine: Multilevel degenerative change without acute abnormality. Electronically Signed   By: Alcide Clever M.D.   On: 11/19/2019 19:37   CT Cervical Spine Wo Contrast  Result Date: 11/19/2019 CLINICAL DATA:  Recent fall EXAM: CT HEAD WITHOUT CONTRAST CT CERVICAL SPINE WITHOUT CONTRAST TECHNIQUE: Multidetector CT imaging of the head and cervical spine was performed following the standard protocol without intravenous contrast. Multiplanar CT image reconstructions of the cervical spine were also generated. COMPARISON:  None. FINDINGS: CT HEAD FINDINGS Brain: Chronic atrophic changes and chronic white matter ischemic changes are noted. No findings to suggest acute hemorrhage, acute infarction or space-occupying mass lesion are noted. Vascular: No hyperdense vessel or unexpected calcification. Skull: Normal. Negative for fracture or focal lesion. Sinuses/Orbits: No acute finding. Other: None. CT CERVICAL SPINE FINDINGS Alignment: Within normal limits. Skull base and vertebrae: 7 cervical segments are well visualized. Vertebral body height is well maintained. Multilevel facet hypertrophic changes are seen. Mild disc space narrowing is noted primarily at C5-6 with vacuum disc phenomena. No acute fracture or acute facet abnormality is noted. The odontoid is within normal limits. Soft tissues and spinal canal: Surrounding soft tissue structures show no acute abnormality. Upper chest: Visualized lung apices are within normal limits.  Other: None IMPRESSION: CT of the head: Chronic atrophic and ischemic changes without acute abnormality. CT of the cervical spine: Multilevel degenerative change without acute abnormality. Electronically Signed   By: Alcide Clever M.D.   On: 11/19/2019 19:37    Cardiac Studies    Patient Profile     Tara Huffman is a 79 y.o. female with a hx of hypertension, hyperlipidemia, and dementia who is being seen today for the evaluation of elevated troponin   Assessment & Plan    1.  Elevated troponins -No cardiac symptoms such as chest pain, shortness of breath. -EKG shows normal sinus rhythm -Nonspecific troponin elevation may be seen with rhabdo. -Echocardiogram is pending.  We will review when it is performed. -No further cardiac work-up is scheduled unless echocardiogram shows significant cardiomyopathy.   Signed, Debbe Odea, MD  11/21/2019,  1:32 PM    Addendum Echocardiogram shows normal systolic and diastolic function. No further cardiac testing planned at this point  Please let us know if further input is needed.  Thank you for this consult.

## 2019-11-22 LAB — COMPREHENSIVE METABOLIC PANEL
ALT: 20 U/L (ref 0–44)
AST: 38 U/L (ref 15–41)
Albumin: 2.5 g/dL — ABNORMAL LOW (ref 3.5–5.0)
Alkaline Phosphatase: 38 U/L (ref 38–126)
Anion gap: 8 (ref 5–15)
BUN: <5 mg/dL — ABNORMAL LOW (ref 8–23)
CO2: 24 mmol/L (ref 22–32)
Calcium: 8.1 mg/dL — ABNORMAL LOW (ref 8.9–10.3)
Chloride: 110 mmol/L (ref 98–111)
Creatinine, Ser: 0.49 mg/dL (ref 0.44–1.00)
GFR calc Af Amer: >60 mL/min (ref >60)
GFR calc non Af Amer: >60 mL/min (ref >60)
Glucose, Bld: 78 mg/dL (ref 70–99)
Potassium: 3.5 mmol/L (ref 3.5–5.1)
Sodium: 142 mmol/L (ref 135–145)
Total Bilirubin: 1.1 mg/dL (ref 0.3–1.2)
Total Protein: 5.4 g/dL — ABNORMAL LOW (ref 6.5–8.1)

## 2019-11-22 LAB — CBC
HCT: 39.7 % (ref 36.0–46.0)
Hemoglobin: 12.8 g/dL (ref 12.0–15.0)
MCH: 31.4 pg (ref 26.0–34.0)
MCHC: 32.2 g/dL (ref 30.0–36.0)
MCV: 97.5 fL (ref 80.0–100.0)
Platelets: 196 10*3/uL (ref 150–400)
RBC: 4.07 MIL/uL (ref 3.87–5.11)
RDW: 12.2 % (ref 11.5–15.5)
WBC: 3.8 10*3/uL — ABNORMAL LOW (ref 4.0–10.5)
nRBC: 0 % (ref 0.0–0.2)

## 2019-11-22 LAB — CK: Total CK: 190 U/L (ref 38–234)

## 2019-11-22 MED ORDER — ATENOLOL 25 MG PO TABS
25.0000 mg | ORAL_TABLET | Freq: Every day | ORAL | Status: DC
  Administered 2019-11-23: 25 mg via ORAL
  Filled 2019-11-22: qty 1

## 2019-11-22 NOTE — Progress Notes (Signed)
1        [place] at [facility]   PATIENT NAME: Tara Huffman    MR#:  [id number]  DATE OF BIRTH:  1941/04/13  SUBJECTIVE:  CHIEF COMPLAINT:   Chief Complaint  Patient presents with  . Fall  awake, weak REVIEW OF SYSTEMS:  Review of Systems  Constitutional: Positive for malaise/fatigue. Negative for diaphoresis, fever and weight loss.  HENT: Negative for ear discharge, ear pain, hearing loss, nosebleeds, sore throat and tinnitus.   Eyes: Negative for blurred vision and pain.  Respiratory: Negative for cough, hemoptysis, shortness of breath and wheezing.   Cardiovascular: Negative for chest pain, palpitations, orthopnea and leg swelling.  Gastrointestinal: Negative for abdominal pain, blood in stool, constipation, diarrhea, heartburn, nausea and vomiting.  Genitourinary: Negative for dysuria, frequency and urgency.  Musculoskeletal: Negative for back pain and myalgias.  Skin: Negative for itching and rash.  Neurological: Negative for dizziness, tingling, tremors, focal weakness, seizures, weakness and headaches.  Psychiatric/Behavioral: Negative for depression. The patient is not nervous/anxious.     DRUG ALLERGIES:   Allergies  Allergen Reactions  . Ambien [Zolpidem]     Sleep walking   VITALS:  Blood pressure (!) 150/64, pulse (!) 57, temperature 97.9 F (36.6 C), temperature source Oral, resp. rate 16, height 5\' 6"  (1.676 m), weight 54.4 kg, last menstrual period 05/20/1994, SpO2 98 %. PHYSICAL EXAMINATION:  Physical Exam HENT:     Head: Normocephalic and atraumatic.  Eyes:     Conjunctiva/sclera: Conjunctivae normal.     Pupils: Pupils are equal, round, and reactive to light.  Neck:     Thyroid: No thyromegaly.     Trachea: No tracheal deviation.  Cardiovascular:     Rate and Rhythm: Normal rate and regular rhythm.     Heart sounds: Normal heart sounds.  Pulmonary:     Effort: Pulmonary effort is normal. No respiratory distress.     Breath sounds:  Normal breath sounds. No wheezing.  Chest:     Chest wall: No tenderness.  Abdominal:     General: Bowel sounds are normal. There is no distension.     Palpations: Abdomen is soft.     Tenderness: There is no abdominal tenderness.  Musculoskeletal:        General: Normal range of motion.     Cervical back: Normal range of motion and neck supple.  Skin:    General: Skin is warm and dry.     Findings: No rash.  Neurological:     Mental Status: She is alert and oriented to person, place, and time.     Cranial Nerves: No cranial nerve deficit.    LABORATORY PANEL:  Female CBC Recent Labs  Lab 11/22/19 0608  WBC 3.8*  HGB 12.8  HCT 39.7  PLT 196   ------------------------------------------------------------------------------------------------------------------ Chemistries  Recent Labs  Lab 11/20/19 0707 11/21/19 0831 11/22/19 0608  NA 138   < > 142  K 3.5   < > 3.5  CL 103   < > 110  CO2 19*   < > 24  GLUCOSE 97   < > 78  BUN 12   < > <5*  CREATININE 0.64   < > 0.49  CALCIUM 9.0   < > 8.1*  MG 1.8  --   --   AST 60*  --  38  ALT 22  --  20  ALKPHOS 49  --  38  BILITOT 1.0  --  1.1   < > =  values in this interval not displayed.   RADIOLOGY:  No results found. ASSESSMENT AND PLAN:   1. Rhabdomyolysis -CK above 1000 on admission -> 395-> 190 - patient was immobile in bath tube for indeterminate period of time. -improving with IV hydration  2.  Elevated troponin:  Due to rhabdomyolysis -No cardiac symptoms such as chest pain, shortness of breath. -EKG shows normal sinus rhythm -No further cardiac work-up is scheduled unless echocardiogram shows significant cardiomyopathy per cardiology  3. HTN- BP stable - continue home meds  4. Dementia - per family she also drinks at least a 6 pack every day. - Continue home meds -  CIWA protocol; no signs of withdrawal at this point       DVT prophylaxis: Heparin subcu Family Communication: No family at bedside  - d/w her daughter Pam  Disposition Plan:  Came from -Home Discharge to SNF Barriers to DC - SNF placement, [facility] team aware  All the records are reviewed and case discussed with Care Management/Social Worker. Management plans discussed with the patient, nursing and they are in agreement.  CODE STATUS: Full Code  TOTAL TIME TAKING CARE OF THIS PATIENT: 35 minutes.   More than 50% of the time was spent in counseling/coordination of care: YES  POSSIBLE D/C IN 1-2 DAYS, DEPENDING ON CLINICAL CONDITION.   Max Sane M.D on 11/22/2019 at 8:18 PM  [facility]   CC: Primary care physician; Venita Lick, NP  Note: This dictation was prepared with Dragon dictation along with smaller phrase technology. Any transcriptional errors that result from this process are unintentional.

## 2019-11-22 NOTE — TOC Initial Note (Signed)
Transition of Care [facility]) - Initial/Assessment Note    Patient Details  Name: Tara Huffman MRN: [id number] Date of Birth: July 14, 1941  [facility] CM/SW Contact:    Maree Krabbe, LCSW Phone Number: 11/22/2019, 9:12 AM  Clinical Narrative:       CSW spoke with pt's daughter via telephone. Pt's daughter states pt lives home alone. Pt's daughter states she is not sure how pt will d/c back home by herself. Pt's daughter was inquiring about SNF. CSW explained that PT would work with pt and make a recommendation and at that time CSW would reach out to pt's daughter to determine choice. CSW will follow up with pt's daughter today.            Expected Discharge Plan: Skilled Nursing Facility Barriers to Discharge: Continued Medical Work up   Patient Goals and CMS Choice Patient states their goals for this hospitalization and ongoing recovery are:: "pt to go to rehab" per daughter   Choice offered to / list presented to : Adult Children  Expected Discharge Plan and Services Expected Discharge Plan: Skilled Nursing Facility In-house Referral: Clinical Social Work   Post Acute Care Choice: Skilled Nursing Facility Living arrangements for the past 2 months: Single Family Home                                      Prior Living Arrangements/Services Living arrangements for the past 2 months: Single Family Home Lives with:: Self Patient language and need for interpreter reviewed:: Yes Do you feel safe going back to the place where you live?: Yes      Need for Family Participation in Patient Care: Yes (Comment) Care giver support system in place?: Yes (comment)   Criminal Activity/Legal Involvement Pertinent to Current Situation/Hospitalization: No - Comment as needed  Activities of Daily Living      Permission Sought/Granted Permission sought to share information with : Family Supports    Share Information with NAME: [name]     Permission granted to share  info w Relationship: Daughter     Emotional Assessment Appearance:: Appears stated age Attitude/Demeanor/Rapport: Unable to Assess Affect (typically observed): Unable to Assess Orientation: : Oriented to Self Alcohol / Substance Use: Not Applicable Psych Involvement: No (comment)  Admission diagnosis:  Atrial fibrillation (HCC) [I48.91] Elevated troponin [R77.8] Elevated CK [R74.8] Acute MI, 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 Fall   . Elevated CK   . MI, acute, non ST segment elevation (HCC) 11/19/2019  . Rhabdomyolysis 11/19/2019  . 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 Atrial fibrillation (HCC) 11/19/2019  . Altered mental status 10/27/2018  . 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 Advanced care planning/counseling discussion 10/27/2018  . 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 Hallucinations 07/29/2015  . Hyperlipidemia   . Hypertension   . Insomnia    PCP:  Marjie Skiff, NP Pharmacy:   [phone] - [place], [place] - [address] [place] [address] Phone: [phone] Fax: [phone]     Social Determinants of Health (SDOH) Interventions    Readmission Risk Interventions No flowsheet data found.

## 2019-11-22 NOTE — [place] FL2 (Signed)
[place] MEDICAID FL2 LEVEL OF CARE SCREENING TOOL     IDENTIFICATION  Patient Name: Tara Huffman Birthdate: 04-11-1941 Sex: female Admission Date (Current Location): 11/19/2019  [place] and [place] Number:  Chiropodist and Address:  [facility], [address] [place], [place] [address]      Provider Number: [id number]  Attending Physician Name and Address:  Delfino Lovett, MD  Relative Name and Phone Number:       Current Level of Care: Hospital Recommended Level of Care: Skilled Nursing Facility Prior Approval Number:    Date Approved/Denied:   PASRR Number:    Discharge Plan: SNF    Current Diagnoses: Patient Active Problem List   Diagnosis Date Noted  . Fall   . Elevated CK   . MI, acute, non ST segment elevation (HCC) 11/19/2019  . Rhabdomyolysis 11/19/2019  . 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 Atrial fibrillation (HCC) 11/19/2019  . Altered mental status 10/27/2018  . 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 Advanced care planning/counseling discussion 10/27/2018  . 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 Hallucinations 07/29/2015  . Hyperlipidemia   . Hypertension   . Insomnia     Orientation RESPIRATION BLADDER Height & Weight     Self  Normal Incontinent Weight: 119 lb 13.8 oz (54.4 kg) Height:  5\' 6"  (167.6 cm)  BEHAVIORAL SYMPTOMS/MOOD NEUROLOGICAL BOWEL NUTRITION STATUS      Continent Diet(regular diet)  AMBULATORY STATUS COMMUNICATION OF NEEDS Skin   Extensive Assist Verbally Normal                       Personal Care Assistance Level of Assistance  Bathing, Dressing, Feeding Bathing Assistance: Maximum assistance Feeding assistance: Limited assistance Dressing Assistance: Maximum assistance Total Care Assistance: Maximum assistance   Functional Limitations Info  Sight, Hearing, Speech Sight Info: Adequate Hearing Info: Adequate Speech Info: Adequate    SPECIAL CARE FACTORS FREQUENCY  PT (By  licensed PT), OT (By licensed OT)     PT Frequency: 5x OT Frequency: 5x            Contractures Contractures Info: Not present    Additional Factors Info  Code Status, Allergies Code Status Info: Full code Allergies Info: Ambien (Zolpidem)           Current Medications (11/22/2019):  This is the current hospital active medication list Current Facility-Administered Medications  Medication Dose Route Frequency Provider Last Rate Last Admin  . 0.9 %  sodium chloride infusion   Intravenous Continuous 01/20/2020, MD 125 mL/hr at 11/22/19 0210 New Bag at 11/22/19 0210  . acetaminophen (TYLENOL) tablet 650 mg  650 mg Oral Q4H PRN Norins, 01/20/20, MD      . aspirin EC tablet 81 mg  81 mg Oral Daily Rosalyn Gess, MD   81 mg at 11/21/19 0943  . atenolol (TENORMIN) tablet 25 mg  25 mg Oral Daily End, Christopher, MD   25 mg at 11/21/19 0943  . atorvastatin (LIPITOR) tablet 40 mg  40 mg Oral q1800 Norins, 01/19/20, MD   40 mg at 11/21/19 1745  . donepezil (ARICEPT) tablet 10 mg  10 mg Oral QHS Norins, 01/19/20, MD   10 mg at 11/21/19 2055  . feeding supplement (ENSURE ENLIVE) (ENSURE ENLIVE) liquid 237 mL  237 mL Oral BID BM 2056, MD   237 mL at 11/21/19 0943  . folic acid (FOLVITE) tablet 1 mg  1  mg Oral Daily Norins, Heinz Knuckles, MD   1 mg at 11/21/19 0943  . heparin injection 5,000 Units  5,000 Units Subcutaneous Q8H End, Harrell Gave, MD   5,000 Units at 11/22/19 0544  . LORazepam (ATIVAN) tablet 1-4 mg  1-4 mg Oral Q1H PRN Norins, Heinz Knuckles, MD       Or  . LORazepam (ATIVAN) injection 1-4 mg  1-4 mg Intravenous Q1H PRN Norins, Heinz Knuckles, MD   4 mg at 11/20/19 1602  . LORazepam (ATIVAN) tablet 0-4 mg  0-4 mg Oral Q12H Norins, Heinz Knuckles, MD      . mirtazapine (REMERON) tablet 30 mg  30 mg Oral QHS Norins, Heinz Knuckles, MD   30 mg at 11/21/19 2125  . multivitamin with minerals tablet 1 tablet  1 tablet Oral Daily Norins, Heinz Knuckles, MD   1 tablet at 11/21/19 [phone]  .  nitroGLYCERIN (NITROSTAT) SL tablet 0.4 mg  0.4 mg Sublingual Q5 Min x 3 PRN Norins, Heinz Knuckles, MD      . ondansetron [facility]) injection 4 mg  4 mg Intravenous Q6H PRN Norins, Heinz Knuckles, MD      . QUEtiapine (SEROQUEL) tablet 25 mg  25 mg Oral QHS Norins, Heinz Knuckles, MD   25 mg at 11/21/19 2055  . thiamine tablet 100 mg  100 mg Oral Daily Norins, Heinz Knuckles, MD   100 mg at 11/21/19 0175   Or  . thiamine (B-1) injection 100 mg  100 mg Intravenous Daily Norins, Heinz Knuckles, MD         Discharge Medications: Please see discharge summary for a list of discharge medications.  Relevant Imaging Results:  Relevant Lab Results:   Additional Information [id number]  Gerrianne Scale Aaro Meyers, LCSW

## 2019-11-22 NOTE — Progress Notes (Signed)
Physical Therapy Treatment Patient Details Name: Tara Huffman MRN: [id number] DOB: 11-18-1940 Today's Date: 11/22/2019    History of Present Illness Patient is 79  yo female found down by her family, unclear how long. PMH of HTN, HLD, dementia, etoh abuse, Per MD elevated troponin due to rhabdomyolysis.    PT Comments    Pt with improved alertness during session, woken from heavy sleep. Oriented to self, may have been oriented to location. Pt conversation/statements appropriate during session. Supine exercises performed AAROM along with environmental stimuli to increase pt alertness. Supine to sit with maxA and cueing for sequencing, exhibited improved sitting balance compared to previous session, CGa-minA for safety. Sit <> stand with RW maxA, initial standing balance improved as well. Pt experienced urine incontinence, further mobility deferred. The patient would benefit from further skilled PT intervention to maximize safety, mobility, and independence.     Follow Up Recommendations  SNF     Equipment Recommendations  Other (comment)(TBD at next venue of care)    Recommendations for Other Services       Precautions / Restrictions Precautions Precautions: Fall Restrictions Weight Bearing Restrictions: No    Mobility  Bed Mobility Overal bed mobility: Needs Assistance Bed Mobility: Supine to Sit;Sit to Supine Rolling: Mod assist   Supine to sit: Max assist;HOB elevated Sit to supine: Mod assist   General bed mobility comments: returning to supine pt able to control trunk, assistance needed for complete LE movement  Transfers Overall transfer level: Needs assistance Equipment used: Rolling walker (2 wheeled) Transfers: Sit to/from Stand Sit to Stand: Max assist         General transfer comment: Pt with improved ability to stand this AM, experienced urine incontinence returned to supine and PT assisted with bed mobility/linen change  Ambulation/Gait              General Gait Details: deferred   Stairs             Wheelchair Mobility    Modified Rankin (Stroke Patients Only)       Balance Overall balance assessment: Needs assistance Sitting-balance support: Feet supported;Bilateral upper extremity supported Sitting balance-Leahy Scale: Poor Sitting balance - Comments: improved sitting balance noted once midline achieved, but still with notable leaning without UE propping or cueing     Standing balance-Leahy Scale: Poor                              Cognition Arousal/Alertness: Lethargic Behavior During Therapy: WFL for tasks assessed/performed Overall Cognitive Status: No family/caregiver present to determine baseline cognitive functioning                                 General Comments: pt oriented to self, much clearer speech today, when she does speak, appropriate to situation. Extended time needed for processing      Exercises      General Comments        Pertinent Vitals/Pain Pain Assessment: Faces Faces Pain Scale: Hurts a little bit Pain Intervention(s): Limited activity within patient's tolerance;Monitored during session;Repositioned    Home Living                      Prior Function            PT Goals (current goals can now be found in the care plan section) Progress towards  PT goals: Progressing toward goals    Frequency    Min 2X/week      PT Plan Current plan remains appropriate    Co-evaluation              AM-PAC PT "6 Clicks" Mobility   Outcome Measure  Help needed turning from your back to your side while in a flat bed without using bedrails?: A Lot Help needed moving from lying on your back to sitting on the side of a flat bed without using bedrails?: A Lot Help needed moving to and from a bed to a chair (including a wheelchair)?: A Lot Help needed standing up from a chair using your arms (e.g., wheelchair or bedside chair)?: Total Help  needed to walk in hospital room?: Total Help needed climbing 3-5 steps with a railing? : Total 6 Click Score: 9    End of Session Equipment Utilized During Treatment: Gait belt Activity Tolerance: Other (comment)(limited by incontinence) Patient left: in bed;with call bell/phone within reach;with bed alarm set;with nursing/sitter in room Nurse Communication: Mobility status PT Visit Diagnosis: Muscle weakness (generalized) (M62.81);Difficulty in walking, not elsewhere classified (R26.2);Other abnormalities of gait and mobility (R26.89)     Time: 8413-2440 PT Time Calculation (min) (ACUTE ONLY): 29 min  Charges:  $Therapeutic Exercise: 23-37 mins                     Olga Coaster PT, DPT 10:13 AM,11/22/19

## 2019-11-22 NOTE — Plan of Care (Signed)
  Problem: Clinical Measurements: Goal: Will remain free from infection Outcome: Progressing Goal: Diagnostic test results will improve Outcome: Progressing   

## 2019-11-22 NOTE — Plan of Care (Signed)
  Problem: Education: Goal: Knowledge of General Education information will improve Description: Including pain rating scale, medication(s)/side effects and non-pharmacologic comfort measures Outcome: Progressing   Problem: Health Behavior/Discharge Planning: Goal: Ability to manage health-related needs will improve Outcome: Not Progressing Note: PT is recommending SNF placement. Awaiting approval process. Will continue to monitor d/c planning for the remainder of the shift. Jari Favre [facility]

## 2019-11-22 NOTE — Care Management Important Message (Signed)
Important Message  Patient Details  Name: Tara Huffman MRN: [id number] Date of Birth: 06/06/1941   Medicare Important Message Given:  Yes     Johnell Comings 11/22/2019, 1:14 PM

## 2019-11-23 DIAGNOSIS — I2510 Atherosclerotic heart disease of native coronary artery without angina pectoris: Secondary | ICD-10-CM | POA: Diagnosis not present

## 2019-11-23 DIAGNOSIS — F0150 Vascular dementia without behavioral disturbance: Secondary | ICD-10-CM | POA: Diagnosis not present

## 2019-11-23 DIAGNOSIS — R778 Other specified abnormalities of plasma proteins: Secondary | ICD-10-CM

## 2019-11-23 DIAGNOSIS — I4891 Unspecified atrial fibrillation: Secondary | ICD-10-CM | POA: Diagnosis not present

## 2019-11-23 DIAGNOSIS — R748 Abnormal levels of other serum enzymes: Secondary | ICD-10-CM | POA: Diagnosis not present

## 2019-11-23 DIAGNOSIS — Z515 Encounter for palliative care: Secondary | ICD-10-CM | POA: Diagnosis not present

## 2019-11-23 DIAGNOSIS — F29 Unspecified psychosis not due to a substance or known physiological condition: Secondary | ICD-10-CM | POA: Diagnosis not present

## 2019-11-23 DIAGNOSIS — Z8679 Personal history of other diseases of the circulatory system: Secondary | ICD-10-CM | POA: Diagnosis not present

## 2019-11-23 DIAGNOSIS — F0390 Unspecified dementia without behavioral disturbance: Secondary | ICD-10-CM | POA: Diagnosis not present

## 2019-11-23 DIAGNOSIS — I10 Essential (primary) hypertension: Secondary | ICD-10-CM | POA: Diagnosis not present

## 2019-11-23 DIAGNOSIS — R2681 Unsteadiness on feet: Secondary | ICD-10-CM | POA: Diagnosis not present

## 2019-11-23 DIAGNOSIS — M6281 Muscle weakness (generalized): Secondary | ICD-10-CM | POA: Diagnosis not present

## 2019-11-23 DIAGNOSIS — M2550 Pain in unspecified joint: Secondary | ICD-10-CM | POA: Diagnosis not present

## 2019-11-23 DIAGNOSIS — N390 Urinary tract infection, site not specified: Secondary | ICD-10-CM | POA: Diagnosis not present

## 2019-11-23 DIAGNOSIS — J302 Other seasonal allergic rhinitis: Secondary | ICD-10-CM | POA: Diagnosis not present

## 2019-11-23 DIAGNOSIS — M6282 Rhabdomyolysis: Secondary | ICD-10-CM | POA: Diagnosis not present

## 2019-11-23 DIAGNOSIS — Z1159 Encounter for screening for other viral diseases: Secondary | ICD-10-CM | POA: Diagnosis not present

## 2019-11-23 DIAGNOSIS — I110 Hypertensive heart disease with heart failure: Secondary | ICD-10-CM | POA: Diagnosis not present

## 2019-11-23 DIAGNOSIS — W19XXXD Unspecified fall, subsequent encounter: Secondary | ICD-10-CM | POA: Diagnosis not present

## 2019-11-23 DIAGNOSIS — Z7401 Bed confinement status: Secondary | ICD-10-CM | POA: Diagnosis not present

## 2019-11-23 DIAGNOSIS — R0981 Nasal congestion: Secondary | ICD-10-CM | POA: Diagnosis not present

## 2019-11-23 DIAGNOSIS — R498 Other voice and resonance disorders: Secondary | ICD-10-CM | POA: Diagnosis not present

## 2019-11-23 DIAGNOSIS — T796XXA Traumatic ischemia of muscle, initial encounter: Secondary | ICD-10-CM | POA: Diagnosis not present

## 2019-11-23 DIAGNOSIS — R488 Other symbolic dysfunctions: Secondary | ICD-10-CM | POA: Diagnosis not present

## 2019-11-23 DIAGNOSIS — W19XXXA Unspecified fall, initial encounter: Secondary | ICD-10-CM | POA: Diagnosis not present

## 2019-11-23 DIAGNOSIS — D649 Anemia, unspecified: Secondary | ICD-10-CM | POA: Diagnosis not present

## 2019-11-23 NOTE — Progress Notes (Signed)
New referral for [employer] community Palliative program to follow at [facility] received from Dr. Sherryll Burger. Patient to discharge today. Information given to referral. Dayna Barker BSN, RN, [facility] Liaison [facility] [phone]

## 2019-11-23 NOTE — Progress Notes (Signed)
Discharged via EMS to [facility]

## 2019-11-23 NOTE — TOC Transition Note (Signed)
Transition of Care [facility]) - CM/SW Discharge Note   Patient Details  Name: Tara Huffman MRN: [id number] Date of Birth: 19-May-1941  Transition of Care [facility]) CM/SW Contact:  Maree Krabbe, LCSW Phone Number: 11/23/2019, 3:10 PM   Clinical Narrative:   Clinical Social Worker facilitated patient discharge including contacting patient family and facility to confirm patient discharge plans.  Clinical information faxed to facility and family agreeable with plan.  CSW arranged ambulance transport via [facility] to [facility].  RN to call [phone] (ask for 800 hall nurse) for report prior to discharge.  Final next level of care: Skilled Nursing Facility Barriers to Discharge: No Barriers Identified   Patient Goals and CMS Choice Patient states their goals for this hospitalization and ongoing recovery are:: "pt to go to rehab" per daughter   Choice offered to / list presented to : Adult Children  Discharge Placement              Patient chooses bed at: [facility] Patient to be transferred to facility by: [facility] Name of family member notified: [name] Patient and family notified of of transfer: 11/23/19  Discharge Plan and Services In-house Referral: Clinical Social Work   Post Acute Care Choice: Skilled Nursing Facility                               Social Determinants of Health (SDOH) Interventions     Readmission Risk Interventions No flowsheet data found.

## 2019-11-23 NOTE — Progress Notes (Signed)
Physical Therapy Treatment Patient Details Name: Tara Huffman MRN: [id number] DOB: 1941-01-02 Today's Date: 11/23/2019    History of Present Illness Patient is 79  yo female found down by her family, unclear how long. PMH of HTN, HLD, dementia, etoh abuse, Per MD elevated troponin due to rhabdomyolysis.    PT Comments    Patient alert, eating breakfast at start of session. Oriented to self, place,disoriented to time and situation. Increased time neededfor processing. Decreased short term memory recall (asked several times for glasses unable to remember PT had just discussed this with her.) Denied pain. Overall the patient presented with improved ability to mobilize this session. Supine to sit with minA and HOB elevated. Sat with fair balance and UE propping for 2-3 minutes. Sit <> stand with RW and modA, especially for initial standing balance. Pt needed step by step cueing for sequencing. ModA for physical assist and RW management to take several steps to recliner. Further mobility held due to patient request to eat breakfast, pt also assisted to call sister. The patient would benefit from further skilled PT intervention to maximize safety, mobility, and independence. Current plan remains appropriate due to level of assistance needed for mobility and acute decline from PLOF.   Follow Up Recommendations  SNF     Equipment Recommendations  Rolling walker with 5" wheels    Recommendations for Other Services       Precautions / Restrictions Precautions Precautions: Fall Restrictions Weight Bearing Restrictions: No    Mobility  Bed Mobility Overal bed mobility: Needs Assistance Bed Mobility: Supine to Sit     Supine to sit: Min assist        Transfers Overall transfer level: Needs assistance Equipment used: Rolling walker (2 wheeled) Transfers: Sit to/from Stand Sit to Stand: Mod assist         General transfer comment: Pt cued for sequencing and safety. significant  assist for initial standing balance  Ambulation/Gait             General Gait Details: Pt able to take several shuffled steps with PT assistance of RW management to recliner in room. Further mobility deferred due to pt request to finish breakfast.   Stairs             Wheelchair Mobility    Modified Rankin (Stroke Patients Only)       Balance Overall balance assessment: Needs assistance Sitting-balance support: Feet supported Sitting balance-Leahy Scale: Fair Sitting balance - Comments: still reliant on UE for balance but did not need assistance from PT     Standing balance-Leahy Scale: Poor                              Cognition Arousal/Alertness: Awake/alert Behavior During Therapy: WFL for tasks assessed/performed                                   General Comments: increased time for processing, able to recall some details of hospital admission, oriented to place, self. decreased short term memory recall (asked several times for glasses unable to remember PT had just discussed this with her.)      Exercises Other Exercises Other Exercises: Pt sat EOB for 2-3 minutes to assess balance and prep for OOB transfer, fair balance this session Other Exercises: Pt re-oriented by PT for situation/time. Pt also assisted to call family (requested  to speak to her sister)    General Comments        Pertinent Vitals/Pain Pain Assessment: No/denies pain    Home Living                      Prior Function            PT Goals (current goals can now be found in the care plan section) Progress towards PT goals: Progressing toward goals    Frequency    Min 2X/week      PT Plan      Co-evaluation              AM-PAC PT "6 Clicks" Mobility   Outcome Measure  Help needed turning from your back to your side while in a flat bed without using bedrails?: A Little Help needed moving from lying on your back to sitting on  the side of a flat bed without using bedrails?: A Lot Help needed moving to and from a bed to a chair (including a wheelchair)?: A Lot Help needed standing up from a chair using your arms (e.g., wheelchair or bedside chair)?: A Lot Help needed to walk in hospital room?: A Lot Help needed climbing 3-5 steps with a railing? : Total 6 Click Score: 12    End of Session Equipment Utilized During Treatment: Gait belt Activity Tolerance: Patient tolerated treatment well Patient left: in chair;with chair alarm set;with call bell/phone within reach Nurse Communication: Mobility status PT Visit Diagnosis: Muscle weakness (generalized) (M62.81);Difficulty in walking, not elsewhere classified (R26.2);Other abnormalities of gait and mobility (R26.89)     Time: 4481-8563 PT Time Calculation (min) (ACUTE ONLY): 23 min  Charges:  $Therapeutic Activity: 23-37 mins                     Lieutenant Diego PT, DPT 9:37 AM,11/23/19

## 2019-11-23 NOTE — Discharge Instructions (Signed)
Rhabdomyolysis °Rhabdomyolysis is a condition that happens when muscle cells break down and release substances into the blood that can damage the kidneys. This condition happens because of damage to the muscles that move bones (skeletal muscle). When the skeletal muscles are damaged, substances inside the muscle cells go into the blood. One of these substances is a protein called myoglobin. °Large amounts of myoglobin can cause kidney damage or kidney failure. Other substances that are released by muscle cells may upset the balance of the minerals (electrolytes) in your blood. This imbalance causes your blood to have too much acid (acidosis). °What are the causes? °This condition is caused by muscle damage. Muscle damage often happens because of: °· Using your muscles too much. °· An injury that crushes or squeezes a muscle too tightly. °· Using illegal drugs, mainly cocaine. °· Alcohol abuse. °Other possible causes include: °· Prescription medicines, such as those that: °? Lower cholesterol (statins). °? Treat ADHD (attention deficit hyperactivity disorder) or help with weight loss (amphetamines). °? Treat pain (opiates). °· Infections. °· Muscle diseases that are passed down from parent to child (inherited). °· High fever. °· Heatstroke. °· Not having enough fluids in your body (dehydration). °· Seizures. °· Surgery. °What increases the risk? °This condition is more likely to develop in people who: °· Have a family history of muscle disease. °· Take part in extreme sports, such as running in marathons. °· Have diabetes. °· Are older. °· Abuse drugs or alcohol. °What are the signs or symptoms? °Symptoms of this condition vary. Some people have very few symptoms, and other people have many symptoms. The most common symptoms include: °· Muscle pain and swelling. °· Weak muscles. °· Dark urine. °· Feeling weak and tired. °Other symptoms include: °· Nausea and vomiting. °· Fever. °· Pain in the abdomen. °· Pain in the  joints. °Symptoms of complications from this condition include: °· Heart rhythm that is not normal (arrhythmia). °· Seizures. °· Not urinating enough because of kidney failure. °· Very low blood pressure (shock). Signs of shock include dizziness, blurry vision, and clammy skin. °· Bleeding that is hard to stop or control. °How is this diagnosed? °This condition is diagnosed based on your medical history, your symptoms, and a physical exam. Tests may also be done, including: °· Blood tests. °· Urine tests to check for myoglobin. °You may also have other tests to check for causes of muscle damage and to check for complications. °How is this treated? °Treatment for this condition helps to: °· Make sure you have enough fluids in your body. °· Lower the acid levels in your blood to reverse acidosis. °· Protect your kidneys. °Treatment may include: °· Fluids and medicines given through an IV tube that is inserted into one of your veins. °· Medicines to lower acidosis or to bring back the balance of the minerals in your body. °· Hemodialysis. This treatment uses an artificial kidney machine to filter your blood while you recover. You may have this if other treatments are not helping. °Follow these instructions at home: ° °· Take over-the-counter and prescription medicines only as told by your health care provider. °· Rest at home until your health care provider says that you can return to your normal activities. °· Drink enough fluid to keep your urine clear or pale yellow. °· Do not do activities that take a lot of effort (are strenuous). Ask your health care provider what level of exercise is safe for you. °· Do not abuse drugs or   alcohol. If you are having problems with drug or alcohol use, ask your health care provider for help. °· Keep all follow-up visits as told by your health care provider. This is important. °Contact a health care provider if: °· You start having symptoms of this condition after treatment. °Get  help right away if: °· You have a seizure. °· You bleed easily or cannot control bleeding. °· You cannot urinate. °· You have chest pain. °· You have trouble breathing. °This information is not intended to replace advice given to you by your health care provider. Make sure you discuss any questions you have with your health care provider. °Document Revised: 09/16/2017 Document Reviewed: 07/16/2016 °Elsevier Patient Education © 2020 [employer] ° °

## 2019-11-23 NOTE — Discharge Summary (Signed)
[facility] NAME: Tara Huffman    MR#:  [id number]  DATE OF BIRTH:  1941-01-17  DATE OF ADMISSION:  11/19/2019   ADMITTING PHYSICIAN: Neena Rhymes, MD  DATE OF DISCHARGE: 11/23/2019  PRIMARY CARE PHYSICIAN: Venita Lick, NP   ADMISSION DIAGNOSIS:  Atrial fibrillation ([place]) [I48.91] Elevated troponin [R77.8] Elevated CK [R74.8] Acute MI, inferior wall ([place]) [I21.19] Fall, initial encounter [W19.XXXA] DISCHARGE DIAGNOSIS:  Active Problems:   Hyperlipidemia   Hypertension   Mild dementia (HCC)   MI, acute, non ST segment elevation (HCC)   Rhabdomyolysis   Acute MI, inferior wall (HCC)   Atrial fibrillation (HCC)   Fall   Elevated CK   Elevated troponin  SECONDARY DIAGNOSIS:   Past Medical History:  Diagnosis Date  . Allergy   . Dementia ([place])   . Hyperlipidemia   . Hypertension   . Insomnia   . Insomnia    HOSPITAL COURSE:  1. Rhabdomyolysis -resolved with hydration  2.Elevated troponin: Due to rhabdomyolysis -No cardiac symptoms such as chest pain, shortness of breath. -EKG shows normal sinus rhythm -No further cardiac work-up is scheduled unless echocardiogram shows significant cardiomyopathy per cardiology  3. HTN- BP stable - continue home meds  4. Dementia  DISCHARGE CONDITIONS:  Fair CONSULTS OBTAINED:   DRUG ALLERGIES:   Allergies  Allergen Reactions  . Ambien [Zolpidem]     Sleep walking   DISCHARGE MEDICATIONS:   Allergies as of 11/23/2019      Reactions   Ambien [zolpidem]    Sleep walking      Medication List    STOP taking these medications   mirtazapine 30 MG tablet Commonly known as: REMERON     TAKE these medications   atenolol 50 MG tablet Commonly known as: TENORMIN TAKE 1 TABLET (50 MG TOTAL) BY MOUTH DAILY.   donepezil 10 MG tablet Commonly known as: ARICEPT TAKE 1 TABLET BY MOUTH EVERY DAY AT NIGHT What changed:   how much to take  how to take  this  when to take this   fluticasone 50 MCG/ACT nasal spray Commonly known as: FLONASE SPRAY 2 SPRAY IN EACH NOSTRIL DAILY What changed:   how much to take  how to take this  when to take this   QUEtiapine 25 MG tablet Commonly known as: SEROQUEL TAKE 1 TABLET (25 MG TOTAL) BY MOUTH AT BEDTIME.      DISCHARGE INSTRUCTIONS:   DIET:  Regular diet DISCHARGE CONDITION:  Fair ACTIVITY:  Activity as tolerated OXYGEN:  Home Oxygen: No.  Oxygen Delivery: room air DISCHARGE LOCATION:  nursing home -palliative care to follow  If you experience worsening of your admission symptoms, develop shortness of breath, life threatening emergency, suicidal or homicidal thoughts you must seek medical attention immediately by calling 911 or calling your MD immediately  if symptoms less severe.  You Must read complete instructions/literature along with all the possible adverse reactions/side effects for all the Medicines you take and that have been prescribed to you. Take any new Medicines after you have completely understood and accpet all the possible adverse reactions/side effects.   Please note  You were cared for by a hospitalist during your hospital stay. If you have any questions about your discharge medications or the care you received while you were in the hospital after you are discharged, you can call the unit and asked to speak with the hospitalist on call  if the hospitalist that took care of you is not available. Once you are discharged, your primary care physician will handle any further medical issues. Please note that NO REFILLS for any discharge medications will be authorized once you are discharged, as it is imperative that you return to your primary care physician (or establish a relationship with a primary care physician if you do not have one) for your aftercare needs so that they can reassess your need for medications and monitor your lab values.    On the day of  Discharge:  VITAL SIGNS:  Blood pressure (!) 142/57, pulse (!) 56, temperature 98.3 F (36.8 C), temperature source Oral, resp. rate 18, height 5\' 6"  (1.676 m), weight 54.6 kg, last menstrual period 05/20/1994, SpO2 100 %. PHYSICAL EXAMINATION:  GENERAL:  79 y.o.-year-old patient lying in the bed with no acute distress.  EYES: Pupils equal, round, reactive to light and accommodation. No scleral icterus. Extraocular muscles intact.  HEENT: Head atraumatic, normocephalic. Oropharynx and nasopharynx clear.  NECK:  Supple, no jugular venous distention. No thyroid enlargement, no tenderness.  LUNGS: Normal breath sounds bilaterally, no wheezing, rales,rhonchi or crepitation. No use of accessory muscles of respiration.  CARDIOVASCULAR: S1, S2 normal. No murmurs, rubs, or gallops.  ABDOMEN: Soft, non-tender, non-distended. Bowel sounds present. No organomegaly or mass.  EXTREMITIES: No pedal edema, cyanosis, or clubbing.  NEUROLOGIC: Cranial nerves II through XII are intact. Muscle strength 5/5 in all extremities. Sensation intact. Gait not checked.  PSYCHIATRIC: The patient is alert and oriented x 3.  SKIN: No obvious rash, lesion, or ulcer.  DATA REVIEW:   CBC Recent Labs  Lab 11/22/19 0608  WBC 3.8*  HGB 12.8  HCT 39.7  PLT 196    Chemistries  Recent Labs  Lab 11/20/19 0707 11/21/19 0831 11/22/19 0608  NA 138   < > 142  K 3.5   < > 3.5  CL 103   < > 110  CO2 19*   < > 24  GLUCOSE 97   < > 78  BUN 12   < > <5*  CREATININE 0.64   < > 0.49  CALCIUM 9.0   < > 8.1*  MG 1.8  --   --   AST 60*  --  38  ALT 22  --  20  ALKPHOS 49  --  38  BILITOT 1.0  --  1.1   < > = values in this interval not displayed.     Microbiology Results  Results for orders placed or performed during the hospital encounter of 11/19/19  SARS CORONAVIRUS 2 (TAT 6-24 HRS) Nasopharyngeal Nasopharyngeal Swab     Status: None   Collection Time: 11/20/19 12:01 AM   Specimen: Nasopharyngeal Swab  Result  Value Ref Range Status   SARS Coronavirus 2 NEGATIVE NEGATIVE Final    Comment: (NOTE) SARS-CoV-2 target nucleic acids are NOT DETECTED. The SARS-CoV-2 RNA is generally detectable in upper and lower respiratory specimens during the acute phase of infection. Negative results do not preclude SARS-CoV-2 infection, do not rule out co-infections with other pathogens, and should not be used as the sole basis for treatment or other patient management decisions. Negative results must be combined with clinical observations, patient history, and epidemiological information. The expected result is Negative. Fact Sheet for Patients: 01/18/20 Fact Sheet for Healthcare Providers: HairSlick.no This test is not yet approved or cleared by the quierodirigir.com FDA and  has been authorized for detection and/or diagnosis of SARS-CoV-2 by  FDA under an Emergency Use Authorization (EUA). This EUA will remain  in effect (meaning this test can be used) for the duration of the COVID-19 declaration under Section 56 4(b)(1) of the Act, 21 U.S.C. section 360bbb-3(b)(1), unless the authorization is terminated or revoked sooner. Performed at [facility], [address] [place], [place] [address]      Contact information for follow-up providers    Marjie Skiff, NP. Schedule an appointment as soon as possible for a visit in 1 week(s).   Specialty: Nurse Practitioner Contact information: [address] [place] [address] [phone]            Contact information for after-discharge care    Destination    [facility] Preferred SNF .   Service: Skilled Nursing Contact information: [address] [place] [address] [phone]                   Management plans discussed with the patient, family and they are in agreement.  CODE STATUS: Full Code   TOTAL TIME TAKING CARE OF THIS PATIENT:  45 minutes.    Delfino Lovett M.D on 11/23/2019 at 2:54 PM  [facility]   CC: Primary care physician; Marjie Skiff, NP   Note: This dictation was prepared with Dragon dictation along with smaller phrase technology. Any transcriptional errors that result from this process are unintentional.

## 2019-11-26 ENCOUNTER — Telehealth: Payer: Self-pay

## 2019-11-26 NOTE — Telephone Encounter (Signed)
Thank you.  I noticed on review of her note she went to SNF.  I appreciate you.

## 2019-11-26 NOTE — Telephone Encounter (Signed)
  Patient was recently discharged from the hospital on 11/23/2019  No TCM completed, patient does not qualify for TCM services due to d/c to snf

## 2019-11-27 DIAGNOSIS — M6282 Rhabdomyolysis: Secondary | ICD-10-CM | POA: Diagnosis not present

## 2019-11-27 DIAGNOSIS — J302 Other seasonal allergic rhinitis: Secondary | ICD-10-CM | POA: Diagnosis not present

## 2019-11-27 DIAGNOSIS — I2510 Atherosclerotic heart disease of native coronary artery without angina pectoris: Secondary | ICD-10-CM | POA: Diagnosis not present

## 2019-11-27 DIAGNOSIS — I10 Essential (primary) hypertension: Secondary | ICD-10-CM | POA: Diagnosis not present

## 2019-11-27 DIAGNOSIS — Z8679 Personal history of other diseases of the circulatory system: Secondary | ICD-10-CM | POA: Diagnosis not present

## 2019-11-27 DIAGNOSIS — W19XXXD Unspecified fall, subsequent encounter: Secondary | ICD-10-CM | POA: Diagnosis not present

## 2019-12-05 ENCOUNTER — Non-Acute Institutional Stay: Payer: Medicare HMO | Admitting: Primary Care

## 2019-12-05 ENCOUNTER — Other Ambulatory Visit: Payer: Self-pay

## 2019-12-05 DIAGNOSIS — J302 Other seasonal allergic rhinitis: Secondary | ICD-10-CM | POA: Diagnosis not present

## 2019-12-05 DIAGNOSIS — W19XXXD Unspecified fall, subsequent encounter: Secondary | ICD-10-CM | POA: Diagnosis not present

## 2019-12-05 DIAGNOSIS — M6282 Rhabdomyolysis: Secondary | ICD-10-CM | POA: Diagnosis not present

## 2019-12-05 DIAGNOSIS — Z515 Encounter for palliative care: Secondary | ICD-10-CM | POA: Diagnosis not present

## 2019-12-05 DIAGNOSIS — F0390 Unspecified dementia without behavioral disturbance: Secondary | ICD-10-CM | POA: Diagnosis not present

## 2019-12-05 DIAGNOSIS — I2510 Atherosclerotic heart disease of native coronary artery without angina pectoris: Secondary | ICD-10-CM | POA: Diagnosis not present

## 2019-12-05 DIAGNOSIS — I10 Essential (primary) hypertension: Secondary | ICD-10-CM | POA: Diagnosis not present

## 2019-12-05 DIAGNOSIS — Z8679 Personal history of other diseases of the circulatory system: Secondary | ICD-10-CM | POA: Diagnosis not present

## 2019-12-05 NOTE — Progress Notes (Signed)
[facility] Pall[facility] Care Consult Note Telephone: [phone]  Fax: [phone]  TELEHEALTH VISIT STATEMENT Due to the COVID-19 crisis, this visit was done via telemedicine from my office. It was initiated and consented to by this patient and/or family.  PATIENT NAME: Tara Huffman [address] [place] [address] [phone] (home)  DOB: 1940/12/21 MRN: [id number]  PRIMARY CARE PROVIDER:   Juluis Pitch, MD, [address] [place] [address] [phone]  REFERRING PROVIDER:  Juluis Pitch, MD [address] [place],  [place] [address] [phone]  RESPONSIBLE PARTY:   Extended Emergency Contact Information Primary Emergency Contact: Huffman,Tara  [place] Phone: [phone] Relation: Sister Secondary Emergency Contact: Tara Huffman [place] Phone: [phone] Mobile Phone: [phone] Relation: Daughter   ASSESSMENT AND RECOMMENDATIONS:   1. Advance Care Planning/Goals of Care: Goals include to maximize quality of life and symptom management. Has POA daughter Tara Huffman. Daughter states she found her in the tub in her clothes, where she'd been for 3 days. She had also stopped her medications 3 months before. She states her going home alone is not possible now for safety reasons, as she'd fallen other times. She mentions her mother is an alcoholic and actively drinks. We discussed Olin Hauser talking with SW about placement options once her skilled rehab is finished.  2. Symptom Management:   Nutrition: Not eating well, has some skin breakdown. Started on nutrition supplements and vitamins. Recommend to continue to monitor and give supplements with meals.  Alcohol intake: Longstanding beer intake of 8 beers a day. Alcohol withdrawal  likely finished in hospital.   Mentation: History of dementia, had been living alone and functioning at home before fall in tub. She had fallen outside and a  passerby stopped and helped her back to the house. She also has had injuries she didn't remember how it happened. Daughter states she's docile now but has been combative. She thinks she lives and works at [employer] and is happy there.  Skin integrity: Air mattress for sacral breakdown. Continue to monitor and increase intake, protein.   3. Family /Caregiver/Community Supports: Daughter is POA, Tara Huffman. Patient is currently in SNF but likely will be placed. Daughter to reach out to SNF SW for information.  4. Cognitive / Functional decline:  Dementia, unclear etiology but has longstanding alcohol intake history. FAST Stage 6c-d. Needs supervision at all times.  5. Follow up Palliative Care Visit: Palliative care will continue to follow for goals of care clarification and symptom management. Return 2-4 weeks or prn.  I spent 45 minutes providing this consultation,  from 1500 to 1545. More than 50% of the time in this consultation was spent coordinating communication.   HISTORY OF PRESENT ILLNESS:  Tara Huffman is a 79 y.o. year old femaleyear old female with multiple medical problems including dementia, ETOH abuse, HTN. Palliative Care was asked to follow this patient by consultation request of Juluis Pitch, MD to help address advance care planning and goals of care. This is the initial visit.  CODE STATUS: DNR, comfort measures, determine use of abx and iv, no feeding tube.  PPS: 30% HOSPICE ELIGIBILITY/DIAGNOSIS: no  PAST MEDICAL HISTORY:  Past Medical History:  Diagnosis Date  . Allergy   . Dementia ([place])   . Hyperlipidemia   . Hypertension   . Insomnia   . Insomnia     SOCIAL HX:  Social History   Tobacco Use  . Smoking status: Never Smoker  .  Smokeless tobacco: Never Used  Substance Use Topics  . Alcohol use: Yes    Alcohol/week: 14.0 standard drinks    Types: 14 Cans of beer per week    ALLERGIES:  Allergies  Allergen Reactions  . Ambien [Zolpidem]     Sleep walking       PERTINENT MEDICATIONS:  Outpatient Encounter Medications as of 12/05/2019  Medication Sig  . atenolol (TENORMIN) 50 MG tablet TAKE 1 TABLET (50 MG TOTAL) BY MOUTH DAILY.  Marland Kitchen donepezil (ARICEPT) 10 MG tablet TAKE 1 TABLET BY MOUTH EVERY DAY AT NIGHT (Patient taking differently: Take 10 mg by mouth at bedtime. TAKE 1 TABLET BY MOUTH EVERY DAY AT NIGHT)  . fluticasone (FLONASE) 50 MCG/ACT nasal spray SPRAY 2 SPRAY IN EACH NOSTRIL DAILY (Patient taking differently: Place 2 sprays into both nostrils daily. SPRAY 2 SPRAY IN EACH NOSTRIL DAILY)  . QUEtiapine (SEROQUEL) 25 MG tablet TAKE 1 TABLET (25 MG TOTAL) BY MOUTH AT BEDTIME.   No facility-administered encounter medications on file as of 12/05/2019.    PHYSICAL EXAM / ROS:   Current and past weights: 142 lbs at hospital 2 weeks ago; 113 lbs at SNF, ht 66 ". This wt should be double checked going forward. General: NAD, frail appearing, thin Cardiovascular: no chest pain reported, no edema in LE Pulmonary: no cough, no increased SOB, room air Abdomen: appetite fair, endorses constipation, incontinent of bowel GU: denies dysuria, incontinent of urine MSK:  no joint deformities, non ambulatory, frequent falls, transfers with PT,  Skin: no rashes or wounds reported Neurological: Weakness, agitation and h/o dementia and alcohol abuse.  Eliezer Lofts, NP [facility]

## 2019-12-12 ENCOUNTER — Non-Acute Institutional Stay: Payer: Medicare HMO | Admitting: Primary Care

## 2019-12-12 ENCOUNTER — Other Ambulatory Visit: Payer: Self-pay

## 2019-12-12 DIAGNOSIS — Z515 Encounter for palliative care: Secondary | ICD-10-CM | POA: Diagnosis not present

## 2019-12-12 NOTE — Progress Notes (Signed)
[facility] Palliative [facility] Consult Note Telephone: [phone]  Fax: [phone]  TELEHEALTH VISIT STATEMENT Due to the COVID-19 crisis, this visit was done via telemedicine from my office. It was initiated and consented to by this patient and/or family.  PATIENT NAME: Tara Huffman [address] [place] [address] [phone] (home)  DOB: 1941/05/29 MRN: [id number]  PRIMARY CARE PROVIDER:   Juluis Pitch, MD, [address] [place] [address] [phone]  REFERRING PROVIDER:  Juluis Pitch, MD [address] [place],  [place] [address] [phone]  RESPONSIBLE PARTY:   Extended Emergency Contact Information Primary Emergency Contact: Tara Huffman [place] of [place] Phone: [phone] Mobile Phone: [phone] Relation: Daughter Secondary Emergency Contact: Tara Huffman  [place] Phone: [phone] Relation: Sister  I met with patient and Tara Bon LPN with video chat.  ASSESSMENT AND RECOMMENDATIONS:   1. Advance Care Planning/Goals of Care: Goals include to maximize quality of life and symptom management. Per staff, daughter has reiterated goals of care including no feeding tube. She will be placed in LTC. Per our discussion last week daughter is open to comfort. She outlined patient's decline and need for placement for deficits in self care.  2. Symptom Management:   Dementia:  Believes she's always already eaten, refusing food. Early satiety/anorexia.  Recommend prostat, other supplements. Staff states she is mostly drinking water. Continues to decline in intake, mostly liquids, and weight. Combative at times, refusing food, meds at times. Remeron has not improved appetite. May benefit from megace.  3. Family /Caregiver/Community Supports: Daughter is POA, in SNF now with LTC placement likely due to self care deficits.  4. Cognitive / Functional decline: Alert, oriented x 1, agitated at times, very  disoriented to time / place. Able to sit up in chair, can perform a few alds, no iadls.  5. Follow up Palliative Care Visit: Palliative care will continue to follow for goals of care clarification and symptom management. Return 4 weeks or prn.  I spent 25 minutes providing this consultation,  from 1000 to 1025. More than 50% of the time in this consultation was spent coordinating communication.   HISTORY OF PRESENT ILLNESS:  Tara Huffman is a 79 y.o. year old female with multiple medical problems including dementia, anorexia, early satiety, falls, recent ETOH abuse. Palliative Care was asked to follow this patient by consultation request of Juluis Pitch, MD to help address advance care planning and goals of care. This is a follow up visit.  CODE STATUS: DNR, comfort measures, determine use of abx and iv, no feeding tube. PPS: 30% HOSPICE ELIGIBILITY/DIAGNOSIS: to be reviewed in upcoming visits.  PAST MEDICAL HISTORY:  Past Medical History:  Diagnosis Date  . Allergy   . Dementia ([place])   . Hyperlipidemia   . Hypertension   . Insomnia   . Insomnia     SOCIAL HX:  Social History   Tobacco Use  . Smoking status: Never Smoker  . Smokeless tobacco: Never Used  Substance Use Topics  . Alcohol use: Yes    Alcohol/week: 14.0 standard drinks    Types: 14 Cans of beer per week    ALLERGIES:  Allergies  Allergen Reactions  . Ambien [Zolpidem]     Sleep walking     PERTINENT MEDICATIONS:  Outpatient Encounter Medications as of 12/12/2019  Medication Sig  . Amino Acids-Protein Hydrolys (FEEDING SUPPLEMENT, PRO-STAT SUGAR FREE 64,) LIQD Take 30 mLs by mouth 3 (three) times daily with meals.  Marland Kitchen  atenolol (TENORMIN) 50 MG tablet TAKE 1 TABLET (50 MG TOTAL) BY MOUTH DAILY.  Marland Kitchen donepezil (ARICEPT) 10 MG tablet TAKE 1 TABLET BY MOUTH EVERY DAY AT NIGHT (Patient taking differently: Take 10 mg by mouth at bedtime. TAKE 1 TABLET BY MOUTH EVERY DAY AT NIGHT)  . fluticasone (FLONASE) 50  MCG/ACT nasal spray SPRAY 2 SPRAY IN EACH NOSTRIL DAILY (Patient taking differently: Place 2 sprays into both nostrils daily. SPRAY 2 SPRAY IN EACH NOSTRIL DAILY)  . Multiple Vitamin (MULTIVITAMIN) tablet Take 1 tablet by mouth daily.  . QUEtiapine (SEROQUEL) 25 MG tablet TAKE 1 TABLET (25 MG TOTAL) BY MOUTH AT BEDTIME.  Marland Kitchen senna (SENOKOT) 8.6 MG tablet Take 1 tablet by mouth daily.  Marland Kitchen zinc oxide 20 % ointment Apply 1 application topically as needed for irritation.   No facility-administered encounter medications on file as of 12/12/2019.    PHYSICAL EXAM / ROS:   Current and past weights: declining General: NAD, frail appearing, thin Cardiovascular: no chest pain reported, no edema,  Pulmonary: no cough, no increased SOB, room air Abdomen: appetite fair to poor, eating 25-50% of meals, denies constipation, incontinent of bowel GU: denies dysuria, incontinent of urine MSK:  no joint deformities, non- ambulatory, up in chair in hall Skin: stage 1 pressure injury Neurological: Weakness, advanced dementia, verbal but disoriented. Appears in no distress on this interview.  Jason Coop, NP [facility]

## 2019-12-15 DIAGNOSIS — I110 Hypertensive heart disease with heart failure: Secondary | ICD-10-CM | POA: Diagnosis not present

## 2019-12-15 DIAGNOSIS — Z8679 Personal history of other diseases of the circulatory system: Secondary | ICD-10-CM | POA: Diagnosis not present

## 2019-12-15 DIAGNOSIS — F0390 Unspecified dementia without behavioral disturbance: Secondary | ICD-10-CM | POA: Diagnosis not present

## 2019-12-15 DIAGNOSIS — J302 Other seasonal allergic rhinitis: Secondary | ICD-10-CM | POA: Diagnosis not present

## 2019-12-15 DIAGNOSIS — M6282 Rhabdomyolysis: Secondary | ICD-10-CM | POA: Diagnosis not present

## 2019-12-15 DIAGNOSIS — W19XXXD Unspecified fall, subsequent encounter: Secondary | ICD-10-CM | POA: Diagnosis not present

## 2020-01-04 DIAGNOSIS — J302 Other seasonal allergic rhinitis: Secondary | ICD-10-CM | POA: Diagnosis not present

## 2020-01-04 DIAGNOSIS — F0390 Unspecified dementia without behavioral disturbance: Secondary | ICD-10-CM | POA: Diagnosis not present

## 2020-01-04 DIAGNOSIS — Z8679 Personal history of other diseases of the circulatory system: Secondary | ICD-10-CM | POA: Diagnosis not present

## 2020-01-04 DIAGNOSIS — W19XXXD Unspecified fall, subsequent encounter: Secondary | ICD-10-CM | POA: Diagnosis not present

## 2020-01-04 DIAGNOSIS — M6281 Muscle weakness (generalized): Secondary | ICD-10-CM | POA: Diagnosis not present

## 2020-01-04 DIAGNOSIS — I110 Hypertensive heart disease with heart failure: Secondary | ICD-10-CM | POA: Diagnosis not present

## 2020-01-07 DIAGNOSIS — I213 ST elevation (STEMI) myocardial infarction of unspecified site: Secondary | ICD-10-CM | POA: Diagnosis not present

## 2020-01-07 DIAGNOSIS — I4891 Unspecified atrial fibrillation: Secondary | ICD-10-CM | POA: Diagnosis not present

## 2020-01-07 DIAGNOSIS — F0150 Vascular dementia without behavioral disturbance: Secondary | ICD-10-CM | POA: Diagnosis not present

## 2020-01-07 DIAGNOSIS — R630 Anorexia: Secondary | ICD-10-CM | POA: Diagnosis not present

## 2020-01-07 DIAGNOSIS — G4700 Insomnia, unspecified: Secondary | ICD-10-CM | POA: Diagnosis not present

## 2020-01-09 ENCOUNTER — Other Ambulatory Visit: Payer: Self-pay

## 2020-01-09 ENCOUNTER — Non-Acute Institutional Stay: Payer: Medicare HMO | Admitting: Primary Care

## 2020-01-09 DIAGNOSIS — Z515 Encounter for palliative care: Secondary | ICD-10-CM | POA: Diagnosis not present

## 2020-01-09 NOTE — Progress Notes (Signed)
[facility] Consult Note Telephone: [phone]  Fax: [phone]    PATIENT NAME: Tara Huffman [address] [place] [address] [phone] (home)  DOB: October 27, 1940 MRN: [id number]  PRIMARY CARE PROVIDER:   Juluis Pitch, MD, [address] [place] [address] [phone]  REFERRING PROVIDER:  Juluis Pitch, MD [address] [place],  [place] [address] [phone]  RESPONSIBLE PARTY:   Extended Emergency Contact Information Primary Emergency Contact: Carmelina Paddock [place] of [place] Phone: [phone] Mobile Phone: [phone] Relation: Daughter Secondary Emergency Contact: Moore,Carolyn  [place] Phone: [phone] Relation: Sister   I met with Mrs. Kachmar in the SNF where she currently resides.  ASSESSMENT AND RECOMMENDATIONS:   1. Advance Care Planning/Goals of Care: Goals include to maximize quality of life and symptom management. I called her daughter and POA Mrs Loanne Drilling to discuss her status. Daughter knows she is continuing to lose weight, now at 105 lbs. She said her weight in health was 170 lbs. We discussed dementia and disease process of losing weight/anorexia. She is not eating much but seems to be alert and interactive. We discussed again advance directives RE feeding tube and POA reiterated this would not be a choice she  would make. She also felt that patient would not have wanted to be prolonged by a feeding tube.   2. Symptom Management:   Constipation: Rx with senna and miralax. Appears to have constipation based on reported BM record. She may not be taking enough of the miralax. If she will take, recommend qod MOM or increase senna to one tablet bid.  Nutrition: Poor intake, offered food and supplements but often states she is not hungry and does not eat. Daughter has brought some favorite take out as well. We discussed that she will bring by some favorite snacks as  her mother is very picky about food. She may need cueing to eat a full meal. Weekly weights, has lost 8 lbs in the past month. Now weighs 105 lb.   Goals of Care: As above, she is in a process of dementia. We discussed goals of care and levels of care.   3. Family /Caregiver/Community Supports: Daughter is POA, has a son who has not visited recently. Lives in [place].  4. Cognitive / Functional decline: Vascular dementia, needs assistance with all iadls and some adls. Can feed self but needs cueing.  5. Follow up Palliative Care Visit: Palliative care will continue to follow for goals of care clarification and symptom management. Return 4-6 weeks or prn.  I spent 35 minutes providing this consultation,  from 1200 to 1235. More than 50% of the time in this consultation was spent coordinating communication.   HISTORY OF PRESENT ILLNESS:  Tara Huffman is a 79 y.o. year old female with multiple medical problems including dementia, anorexia, early satiety, falls, recent ETOH abuse. Palliative Care was asked to follow this patient by consultation request of Juluis Pitch, MD to help address advance care planning and goals of care. This is a follow up visit.  CODE STATUS: DNR, comfort measures, determine use of abx and iv, no feeding tube.   PPS: 30% HOSPICE ELIGIBILITY/DIAGNOSIS: TBD  PAST MEDICAL HISTORY:  Past Medical History:  Diagnosis Date  . Allergy   . Dementia ([place])   . Hyperlipidemia   . Hypertension   . Insomnia   . Insomnia     SOCIAL HX:  Social History   Tobacco Use  .  Smoking status: Never Smoker  . Smokeless tobacco: Never Used  Substance Use Topics  . Alcohol use: Yes    Alcohol/week: 14.0 standard drinks    Types: 14 Cans of beer per week    ALLERGIES:  Allergies  Allergen Reactions  . Ambien [Zolpidem]     Sleep walking     PERTINENT MEDICATIONS:  Outpatient Encounter Medications as of 01/09/2020  Medication Sig  . Amino Acids-Protein Hydrolys (FEEDING  SUPPLEMENT, PRO-STAT SUGAR FREE 64,) LIQD Take 30 mLs by mouth 3 (three) times daily with meals.  Marland Kitchen ascorbic acid (VITAMIN C) 500 MG tablet Take 500 mg by mouth daily.  Marland Kitchen atenolol (TENORMIN) 50 MG tablet TAKE 1 TABLET (50 MG TOTAL) BY MOUTH DAILY.  Marland Kitchen donepezil (ARICEPT) 10 MG tablet TAKE 1 TABLET BY MOUTH EVERY DAY AT NIGHT (Patient taking differently: Take 10 mg by mouth at bedtime. TAKE 1 TABLET BY MOUTH EVERY DAY AT NIGHT)  . fluticasone (FLONASE) 50 MCG/ACT nasal spray SPRAY 2 SPRAY IN EACH NOSTRIL DAILY (Patient taking differently: Place 2 sprays into both nostrils daily. SPRAY 2 SPRAY IN EACH NOSTRIL DAILY)  . Multiple Vitamin (MULTIVITAMIN) tablet Take 1 tablet by mouth daily.  . polyethylene glycol (MIRALAX / GLYCOLAX) 17 g packet Take 17 g by mouth daily.  . QUEtiapine (SEROQUEL) 25 MG tablet TAKE 1 TABLET (25 MG TOTAL) BY MOUTH AT BEDTIME.  Marland Kitchen senna (SENOKOT) 8.6 MG tablet Take 1 tablet by mouth daily.  . [DISCONTINUED] zinc oxide 20 % ointment Apply 1 application topically as needed for irritation.   No facility-administered encounter medications on file as of 01/09/2020.    PHYSICAL EXAM / ROS:   deferred  Jason Coop, NP [facility]  COVID-19 PATIENT SCREENING TOOL  Person answering questions: ____________Staff_______ _____   1.  Is the patient or any family member in the home showing any signs or symptoms regarding respiratory infection?               Person with Symptom- __________NA_________________  a. Fever                                                                          Yes___ No___          ___________________  b. Shortness of breath                                                    Yes___ No___          ___________________ c. Cough/congestion                                       Yes___  No___         ___________________ d. Body aches/pains  Yes___ No___         ____________________ e. Gastrointestinal symptoms (diarrhea, nausea)           Yes___ No___        ____________________  2. Within the past 14 days, has anyone living in the home had any contact with someone with or under investigation for COVID-19?    Yes___ No_X_   Person __________________

## 2020-01-14 DIAGNOSIS — D649 Anemia, unspecified: Secondary | ICD-10-CM | POA: Diagnosis not present

## 2020-01-14 DIAGNOSIS — N390 Urinary tract infection, site not specified: Secondary | ICD-10-CM | POA: Diagnosis not present

## 2020-01-15 DIAGNOSIS — I4891 Unspecified atrial fibrillation: Secondary | ICD-10-CM | POA: Diagnosis not present

## 2020-01-15 DIAGNOSIS — N390 Urinary tract infection, site not specified: Secondary | ICD-10-CM | POA: Diagnosis not present

## 2020-01-15 DIAGNOSIS — I10 Essential (primary) hypertension: Secondary | ICD-10-CM | POA: Diagnosis not present

## 2020-01-15 DIAGNOSIS — G4700 Insomnia, unspecified: Secondary | ICD-10-CM | POA: Diagnosis not present

## 2020-01-15 DIAGNOSIS — R319 Hematuria, unspecified: Secondary | ICD-10-CM | POA: Diagnosis not present

## 2020-01-15 DIAGNOSIS — I213 ST elevation (STEMI) myocardial infarction of unspecified site: Secondary | ICD-10-CM | POA: Diagnosis not present

## 2020-01-15 DIAGNOSIS — F0150 Vascular dementia without behavioral disturbance: Secondary | ICD-10-CM | POA: Diagnosis not present

## 2020-01-15 DIAGNOSIS — R630 Anorexia: Secondary | ICD-10-CM | POA: Diagnosis not present

## 2020-01-15 DIAGNOSIS — R451 Restlessness and agitation: Secondary | ICD-10-CM | POA: Diagnosis not present

## 2020-01-16 DIAGNOSIS — D509 Iron deficiency anemia, unspecified: Secondary | ICD-10-CM | POA: Diagnosis not present

## 2020-01-16 DIAGNOSIS — D649 Anemia, unspecified: Secondary | ICD-10-CM | POA: Diagnosis not present

## 2020-01-16 DIAGNOSIS — I10 Essential (primary) hypertension: Secondary | ICD-10-CM | POA: Diagnosis not present

## 2020-01-16 DIAGNOSIS — R569 Unspecified convulsions: Secondary | ICD-10-CM | POA: Diagnosis not present

## 2020-01-16 DIAGNOSIS — N390 Urinary tract infection, site not specified: Secondary | ICD-10-CM | POA: Diagnosis not present

## 2020-01-22 DIAGNOSIS — R630 Anorexia: Secondary | ICD-10-CM | POA: Diagnosis not present

## 2020-01-22 DIAGNOSIS — R451 Restlessness and agitation: Secondary | ICD-10-CM | POA: Diagnosis not present

## 2020-01-22 DIAGNOSIS — F0150 Vascular dementia without behavioral disturbance: Secondary | ICD-10-CM | POA: Diagnosis not present

## 2020-01-22 DIAGNOSIS — I213 ST elevation (STEMI) myocardial infarction of unspecified site: Secondary | ICD-10-CM | POA: Diagnosis not present

## 2020-01-22 DIAGNOSIS — G4700 Insomnia, unspecified: Secondary | ICD-10-CM | POA: Diagnosis not present

## 2020-01-29 DIAGNOSIS — I213 ST elevation (STEMI) myocardial infarction of unspecified site: Secondary | ICD-10-CM | POA: Diagnosis not present

## 2020-01-29 DIAGNOSIS — R630 Anorexia: Secondary | ICD-10-CM | POA: Diagnosis not present

## 2020-01-29 DIAGNOSIS — G4700 Insomnia, unspecified: Secondary | ICD-10-CM | POA: Diagnosis not present

## 2020-01-29 DIAGNOSIS — F0150 Vascular dementia without behavioral disturbance: Secondary | ICD-10-CM | POA: Diagnosis not present

## 2020-01-29 DIAGNOSIS — R451 Restlessness and agitation: Secondary | ICD-10-CM | POA: Diagnosis not present

## 2020-01-30 DIAGNOSIS — Z1159 Encounter for screening for other viral diseases: Secondary | ICD-10-CM | POA: Diagnosis not present

## 2020-02-05 DIAGNOSIS — Z8679 Personal history of other diseases of the circulatory system: Secondary | ICD-10-CM | POA: Diagnosis not present

## 2020-02-05 DIAGNOSIS — I110 Hypertensive heart disease with heart failure: Secondary | ICD-10-CM | POA: Diagnosis not present

## 2020-02-05 DIAGNOSIS — W19XXXD Unspecified fall, subsequent encounter: Secondary | ICD-10-CM | POA: Diagnosis not present

## 2020-02-05 DIAGNOSIS — Z1159 Encounter for screening for other viral diseases: Secondary | ICD-10-CM | POA: Diagnosis not present

## 2020-02-05 DIAGNOSIS — M6281 Muscle weakness (generalized): Secondary | ICD-10-CM | POA: Diagnosis not present

## 2020-02-05 DIAGNOSIS — J302 Other seasonal allergic rhinitis: Secondary | ICD-10-CM | POA: Diagnosis not present

## 2020-02-06 ENCOUNTER — Non-Acute Institutional Stay: Payer: Medicare HMO | Admitting: Primary Care

## 2020-02-06 ENCOUNTER — Other Ambulatory Visit: Payer: Self-pay

## 2020-02-06 DIAGNOSIS — Z515 Encounter for palliative care: Secondary | ICD-10-CM

## 2020-02-06 DIAGNOSIS — R569 Unspecified convulsions: Secondary | ICD-10-CM | POA: Diagnosis not present

## 2020-02-06 DIAGNOSIS — D649 Anemia, unspecified: Secondary | ICD-10-CM | POA: Diagnosis not present

## 2020-02-06 DIAGNOSIS — F0150 Vascular dementia without behavioral disturbance: Secondary | ICD-10-CM | POA: Diagnosis not present

## 2020-02-06 NOTE — Progress Notes (Signed)
[facility] Consult Note Telephone: [phone]  Fax: [phone]    PATIENT NAME: Tara Huffman [address] [place] [address] [phone] (home)  DOB: 1941-03-02 MRN: [id number]  PRIMARY CARE PROVIDER:   Juluis Pitch, MD, [address] [place] [address] [phone]  REFERRING PROVIDER:  Juluis Pitch, MD [address] [place],  [place] [address] [phone]  RESPONSIBLE PARTY:   Extended Emergency Contact Information Primary Emergency Contact: Tara Huffman [place] of [place] Phone: [phone] Mobile Phone: [phone] Relation: Daughter Secondary Emergency Contact: Huffman,Tara  [place] Phone: [phone] Relation: Sister   I met with patient at facility.  ASSESSMENT AND RECOMMENDATIONS:   1. Advance Care Planning/Goals of Care: Goals include to maximize quality of life and symptom management. T/c to [place], [place]. Discussed agitation during day. She will discuss with NP from Psych re behavior monitoring.  2. Symptom Management:   Today patient is very lethargic. She was given PRN ativan recently. Staff reports she was agitated earlier. I recommend a medication at hs for agitation to improve sleep and hopefully decrease day time agitation.I also recommend SSRI or SNRI as pt has a history of alcohol abuse and depression may be masked by dementia.   3. Family /Caregiver/Community Supports: Daughter Tara Huffman is POA. Lives in [place].  4. Cognitive / Functional decline: Lethargic today. In bed, has not eaten breakfast due to lethargy.  5. Follow up Palliative Care Visit: Palliative care will continue to follow for goals of care clarification and symptom management. Return 4 weeks or prn.  I spent 25 minutes providing this consultation,  from 1030 to 1055. More than 50% of the time in this consultation was spent coordinating communication.   HISTORY OF PRESENT ILLNESS:  Tara Huffman is a 79 y.o. year old female with multiple medical problems including ETOH abuse, dementia, agitation. Palliative Care was asked to follow this patient by consultation request of Juluis Pitch, MD to help address advance care planning and goals of care. This is a follow up visit.  CODE STATUS: DNR, comfort measures, determine use of abx and iv, no feeding tube.  PPS: 30% HOSPICE ELIGIBILITY/DIAGNOSIS: TBD  PAST MEDICAL HISTORY:  Past Medical History:  Diagnosis Date  . Allergy   . Dementia ([place])   . Hyperlipidemia   . Hypertension   . Insomnia   . Insomnia     SOCIAL HX:  Social History   Tobacco Use  . Smoking status: Never Smoker  . Smokeless tobacco: Never Used  Substance Use Topics  . Alcohol use: Yes    Alcohol/week: 14.0 standard drinks    Types: 14 Cans of beer per week    ALLERGIES:  Allergies  Allergen Reactions  . Ambien [Zolpidem]     Sleep walking     PERTINENT MEDICATIONS:  Outpatient Encounter Medications as of 02/06/2020  Medication Sig  . Amino Acids-Protein Hydrolys (FEEDING SUPPLEMENT, PRO-STAT SUGAR FREE 64,) LIQD Take 30 mLs by mouth 3 (three) times daily with meals.  Marland Kitchen ascorbic acid (VITAMIN C) 500 MG tablet Take 500 mg by mouth daily.  Marland Kitchen atenolol (TENORMIN) 50 MG tablet TAKE 1 TABLET (50 MG TOTAL) BY MOUTH DAILY.  Marland Kitchen donepezil (ARICEPT) 10 MG tablet TAKE 1 TABLET BY MOUTH EVERY DAY AT NIGHT (Patient taking differently: Take 10 mg by mouth at bedtime. TAKE 1 TABLET BY MOUTH EVERY DAY AT NIGHT)  . fluticasone (FLONASE) 50 MCG/ACT nasal spray SPRAY 2 SPRAY IN [facility]  NOSTRIL DAILY (Patient taking differently: Place 2 sprays into both nostrils daily. SPRAY 2 SPRAY IN EACH NOSTRIL DAILY)  . Multiple Vitamin (MULTIVITAMIN) tablet Take 1 tablet by mouth daily.  . polyethylene glycol (MIRALAX / GLYCOLAX) 17 g packet Take 17 g by mouth daily.  . QUEtiapine (SEROQUEL) 25 MG tablet TAKE 1 TABLET (25 MG TOTAL) BY MOUTH AT BEDTIME.  Marland Kitchen senna (SENOKOT) 8.6  MG tablet Take 1 tablet by mouth daily.   No facility-administered encounter medications on file as of 02/06/2020.    PHYSICAL EXAM / ROS:   Current and past weights: TBD General: NAD, frail appearing, thin Cardiovascular: no chest pain reported, no edema  Pulmonary: no cough, no increased SOB, room air Abdomen: appetite fair, denies constipation, incontinent of bowel GU: denies dysuria, incontinent of urine MSK:  no joint deformities, non ambulatory Skin: no rashes or wounds reported Neurological: Weakness, dementia, agitation earlier, lorazepam given now drowsy.  Jason Coop, NP [facility]  COVID-19 PATIENT SCREENING TOOL  Person answering questions: ____________Staff_______ _____   1.  Is the patient or any family member in the home showing any signs or symptoms regarding respiratory infection?               Person with Symptom- __________NA_________________  a. Fever                                                                          Yes___ No___          ___________________  b. Shortness of breath                                                    Yes___ No___          ___________________ c. Cough/congestion                                       Yes___  No___         ___________________ d. Body aches/pains                                                         Yes___ No___        ____________________ e. Gastrointestinal symptoms (diarrhea, nausea)           Yes___ No___        ____________________  2. Within the past 14 days, has anyone living in the home had any contact with someone with or under investigation for COVID-19?    Yes___ No_X_   Person __________________

## 2020-02-12 DIAGNOSIS — F0150 Vascular dementia without behavioral disturbance: Secondary | ICD-10-CM | POA: Diagnosis not present

## 2020-02-12 DIAGNOSIS — I213 ST elevation (STEMI) myocardial infarction of unspecified site: Secondary | ICD-10-CM | POA: Diagnosis not present

## 2020-02-12 DIAGNOSIS — R630 Anorexia: Secondary | ICD-10-CM | POA: Diagnosis not present

## 2020-02-12 DIAGNOSIS — Z1159 Encounter for screening for other viral diseases: Secondary | ICD-10-CM | POA: Diagnosis not present

## 2020-02-12 DIAGNOSIS — G4700 Insomnia, unspecified: Secondary | ICD-10-CM | POA: Diagnosis not present

## 2020-02-12 DIAGNOSIS — R451 Restlessness and agitation: Secondary | ICD-10-CM | POA: Diagnosis not present

## 2020-02-18 DIAGNOSIS — R0981 Nasal congestion: Secondary | ICD-10-CM | POA: Diagnosis not present

## 2020-02-18 DIAGNOSIS — M6281 Muscle weakness (generalized): Secondary | ICD-10-CM | POA: Diagnosis not present

## 2020-02-18 DIAGNOSIS — I10 Essential (primary) hypertension: Secondary | ICD-10-CM | POA: Diagnosis not present

## 2020-02-18 DIAGNOSIS — F0150 Vascular dementia without behavioral disturbance: Secondary | ICD-10-CM | POA: Diagnosis not present

## 2020-02-18 DIAGNOSIS — R5381 Other malaise: Secondary | ICD-10-CM | POA: Diagnosis not present

## 2020-02-18 DIAGNOSIS — I2510 Atherosclerotic heart disease of native coronary artery without angina pectoris: Secondary | ICD-10-CM | POA: Diagnosis not present

## 2020-02-18 DIAGNOSIS — R262 Difficulty in walking, not elsewhere classified: Secondary | ICD-10-CM | POA: Diagnosis not present

## 2020-02-18 DIAGNOSIS — I4891 Unspecified atrial fibrillation: Secondary | ICD-10-CM | POA: Diagnosis not present

## 2020-02-25 DIAGNOSIS — I2510 Atherosclerotic heart disease of native coronary artery without angina pectoris: Secondary | ICD-10-CM | POA: Diagnosis not present

## 2020-02-25 DIAGNOSIS — F0150 Vascular dementia without behavioral disturbance: Secondary | ICD-10-CM | POA: Diagnosis not present

## 2020-02-25 DIAGNOSIS — R5381 Other malaise: Secondary | ICD-10-CM | POA: Diagnosis not present

## 2020-02-25 DIAGNOSIS — R0981 Nasal congestion: Secondary | ICD-10-CM | POA: Diagnosis not present

## 2020-02-25 DIAGNOSIS — I4891 Unspecified atrial fibrillation: Secondary | ICD-10-CM | POA: Diagnosis not present

## 2020-02-25 DIAGNOSIS — I10 Essential (primary) hypertension: Secondary | ICD-10-CM | POA: Diagnosis not present

## 2020-02-25 DIAGNOSIS — M6281 Muscle weakness (generalized): Secondary | ICD-10-CM | POA: Diagnosis not present

## 2020-02-25 DIAGNOSIS — R262 Difficulty in walking, not elsewhere classified: Secondary | ICD-10-CM | POA: Diagnosis not present

## 2020-02-26 DIAGNOSIS — R5381 Other malaise: Secondary | ICD-10-CM | POA: Diagnosis not present

## 2020-02-26 DIAGNOSIS — I213 ST elevation (STEMI) myocardial infarction of unspecified site: Secondary | ICD-10-CM | POA: Diagnosis not present

## 2020-02-26 DIAGNOSIS — R0981 Nasal congestion: Secondary | ICD-10-CM | POA: Diagnosis not present

## 2020-02-26 DIAGNOSIS — I4891 Unspecified atrial fibrillation: Secondary | ICD-10-CM | POA: Diagnosis not present

## 2020-02-26 DIAGNOSIS — M6281 Muscle weakness (generalized): Secondary | ICD-10-CM | POA: Diagnosis not present

## 2020-02-26 DIAGNOSIS — G4700 Insomnia, unspecified: Secondary | ICD-10-CM | POA: Diagnosis not present

## 2020-02-26 DIAGNOSIS — R451 Restlessness and agitation: Secondary | ICD-10-CM | POA: Diagnosis not present

## 2020-02-26 DIAGNOSIS — R630 Anorexia: Secondary | ICD-10-CM | POA: Diagnosis not present

## 2020-02-26 DIAGNOSIS — I2510 Atherosclerotic heart disease of native coronary artery without angina pectoris: Secondary | ICD-10-CM | POA: Diagnosis not present

## 2020-02-26 DIAGNOSIS — I10 Essential (primary) hypertension: Secondary | ICD-10-CM | POA: Diagnosis not present

## 2020-02-26 DIAGNOSIS — R262 Difficulty in walking, not elsewhere classified: Secondary | ICD-10-CM | POA: Diagnosis not present

## 2020-02-26 DIAGNOSIS — F0150 Vascular dementia without behavioral disturbance: Secondary | ICD-10-CM | POA: Diagnosis not present

## 2020-02-27 DIAGNOSIS — I10 Essential (primary) hypertension: Secondary | ICD-10-CM | POA: Diagnosis not present

## 2020-02-27 DIAGNOSIS — R0981 Nasal congestion: Secondary | ICD-10-CM | POA: Diagnosis not present

## 2020-02-27 DIAGNOSIS — M6281 Muscle weakness (generalized): Secondary | ICD-10-CM | POA: Diagnosis not present

## 2020-02-27 DIAGNOSIS — R262 Difficulty in walking, not elsewhere classified: Secondary | ICD-10-CM | POA: Diagnosis not present

## 2020-02-27 DIAGNOSIS — I4891 Unspecified atrial fibrillation: Secondary | ICD-10-CM | POA: Diagnosis not present

## 2020-02-27 DIAGNOSIS — F0150 Vascular dementia without behavioral disturbance: Secondary | ICD-10-CM | POA: Diagnosis not present

## 2020-02-27 DIAGNOSIS — R5381 Other malaise: Secondary | ICD-10-CM | POA: Diagnosis not present

## 2020-02-27 DIAGNOSIS — I2510 Atherosclerotic heart disease of native coronary artery without angina pectoris: Secondary | ICD-10-CM | POA: Diagnosis not present

## 2020-02-28 DIAGNOSIS — F0150 Vascular dementia without behavioral disturbance: Secondary | ICD-10-CM | POA: Diagnosis not present

## 2020-02-28 DIAGNOSIS — I4891 Unspecified atrial fibrillation: Secondary | ICD-10-CM | POA: Diagnosis not present

## 2020-02-28 DIAGNOSIS — R262 Difficulty in walking, not elsewhere classified: Secondary | ICD-10-CM | POA: Diagnosis not present

## 2020-02-28 DIAGNOSIS — I2510 Atherosclerotic heart disease of native coronary artery without angina pectoris: Secondary | ICD-10-CM | POA: Diagnosis not present

## 2020-02-28 DIAGNOSIS — R5381 Other malaise: Secondary | ICD-10-CM | POA: Diagnosis not present

## 2020-02-28 DIAGNOSIS — R0981 Nasal congestion: Secondary | ICD-10-CM | POA: Diagnosis not present

## 2020-02-28 DIAGNOSIS — M6281 Muscle weakness (generalized): Secondary | ICD-10-CM | POA: Diagnosis not present

## 2020-02-28 DIAGNOSIS — I10 Essential (primary) hypertension: Secondary | ICD-10-CM | POA: Diagnosis not present

## 2020-02-29 DIAGNOSIS — I4891 Unspecified atrial fibrillation: Secondary | ICD-10-CM | POA: Diagnosis not present

## 2020-02-29 DIAGNOSIS — M6281 Muscle weakness (generalized): Secondary | ICD-10-CM | POA: Diagnosis not present

## 2020-02-29 DIAGNOSIS — R5381 Other malaise: Secondary | ICD-10-CM | POA: Diagnosis not present

## 2020-02-29 DIAGNOSIS — I10 Essential (primary) hypertension: Secondary | ICD-10-CM | POA: Diagnosis not present

## 2020-02-29 DIAGNOSIS — I2510 Atherosclerotic heart disease of native coronary artery without angina pectoris: Secondary | ICD-10-CM | POA: Diagnosis not present

## 2020-02-29 DIAGNOSIS — R0981 Nasal congestion: Secondary | ICD-10-CM | POA: Diagnosis not present

## 2020-02-29 DIAGNOSIS — R262 Difficulty in walking, not elsewhere classified: Secondary | ICD-10-CM | POA: Diagnosis not present

## 2020-02-29 DIAGNOSIS — F0150 Vascular dementia without behavioral disturbance: Secondary | ICD-10-CM | POA: Diagnosis not present

## 2020-03-03 DIAGNOSIS — R262 Difficulty in walking, not elsewhere classified: Secondary | ICD-10-CM | POA: Diagnosis not present

## 2020-03-03 DIAGNOSIS — I2510 Atherosclerotic heart disease of native coronary artery without angina pectoris: Secondary | ICD-10-CM | POA: Diagnosis not present

## 2020-03-03 DIAGNOSIS — M6281 Muscle weakness (generalized): Secondary | ICD-10-CM | POA: Diagnosis not present

## 2020-03-03 DIAGNOSIS — R0981 Nasal congestion: Secondary | ICD-10-CM | POA: Diagnosis not present

## 2020-03-03 DIAGNOSIS — I4891 Unspecified atrial fibrillation: Secondary | ICD-10-CM | POA: Diagnosis not present

## 2020-03-03 DIAGNOSIS — I10 Essential (primary) hypertension: Secondary | ICD-10-CM | POA: Diagnosis not present

## 2020-03-03 DIAGNOSIS — F0150 Vascular dementia without behavioral disturbance: Secondary | ICD-10-CM | POA: Diagnosis not present

## 2020-03-03 DIAGNOSIS — R5381 Other malaise: Secondary | ICD-10-CM | POA: Diagnosis not present

## 2020-03-04 DIAGNOSIS — R630 Anorexia: Secondary | ICD-10-CM | POA: Diagnosis not present

## 2020-03-04 DIAGNOSIS — I2510 Atherosclerotic heart disease of native coronary artery without angina pectoris: Secondary | ICD-10-CM | POA: Diagnosis not present

## 2020-03-04 DIAGNOSIS — R262 Difficulty in walking, not elsewhere classified: Secondary | ICD-10-CM | POA: Diagnosis not present

## 2020-03-04 DIAGNOSIS — I213 ST elevation (STEMI) myocardial infarction of unspecified site: Secondary | ICD-10-CM | POA: Diagnosis not present

## 2020-03-04 DIAGNOSIS — F0150 Vascular dementia without behavioral disturbance: Secondary | ICD-10-CM | POA: Diagnosis not present

## 2020-03-04 DIAGNOSIS — I10 Essential (primary) hypertension: Secondary | ICD-10-CM | POA: Diagnosis not present

## 2020-03-04 DIAGNOSIS — R0981 Nasal congestion: Secondary | ICD-10-CM | POA: Diagnosis not present

## 2020-03-04 DIAGNOSIS — I4891 Unspecified atrial fibrillation: Secondary | ICD-10-CM | POA: Diagnosis not present

## 2020-03-04 DIAGNOSIS — R451 Restlessness and agitation: Secondary | ICD-10-CM | POA: Diagnosis not present

## 2020-03-04 DIAGNOSIS — G4700 Insomnia, unspecified: Secondary | ICD-10-CM | POA: Diagnosis not present

## 2020-03-04 DIAGNOSIS — M6281 Muscle weakness (generalized): Secondary | ICD-10-CM | POA: Diagnosis not present

## 2020-03-04 DIAGNOSIS — R5381 Other malaise: Secondary | ICD-10-CM | POA: Diagnosis not present

## 2020-03-05 DIAGNOSIS — R5381 Other malaise: Secondary | ICD-10-CM | POA: Diagnosis not present

## 2020-03-05 DIAGNOSIS — F0150 Vascular dementia without behavioral disturbance: Secondary | ICD-10-CM | POA: Diagnosis not present

## 2020-03-05 DIAGNOSIS — R262 Difficulty in walking, not elsewhere classified: Secondary | ICD-10-CM | POA: Diagnosis not present

## 2020-03-05 DIAGNOSIS — R0981 Nasal congestion: Secondary | ICD-10-CM | POA: Diagnosis not present

## 2020-03-05 DIAGNOSIS — I10 Essential (primary) hypertension: Secondary | ICD-10-CM | POA: Diagnosis not present

## 2020-03-05 DIAGNOSIS — M6281 Muscle weakness (generalized): Secondary | ICD-10-CM | POA: Diagnosis not present

## 2020-03-05 DIAGNOSIS — I2510 Atherosclerotic heart disease of native coronary artery without angina pectoris: Secondary | ICD-10-CM | POA: Diagnosis not present

## 2020-03-05 DIAGNOSIS — I4891 Unspecified atrial fibrillation: Secondary | ICD-10-CM | POA: Diagnosis not present

## 2020-03-06 DIAGNOSIS — F0390 Unspecified dementia without behavioral disturbance: Secondary | ICD-10-CM | POA: Diagnosis not present

## 2020-03-06 DIAGNOSIS — F0150 Vascular dementia without behavioral disturbance: Secondary | ICD-10-CM | POA: Diagnosis not present

## 2020-03-06 DIAGNOSIS — M6281 Muscle weakness (generalized): Secondary | ICD-10-CM | POA: Diagnosis not present

## 2020-03-06 DIAGNOSIS — W19XXXD Unspecified fall, subsequent encounter: Secondary | ICD-10-CM | POA: Diagnosis not present

## 2020-03-06 DIAGNOSIS — I4891 Unspecified atrial fibrillation: Secondary | ICD-10-CM | POA: Diagnosis not present

## 2020-03-06 DIAGNOSIS — Z8679 Personal history of other diseases of the circulatory system: Secondary | ICD-10-CM | POA: Diagnosis not present

## 2020-03-06 DIAGNOSIS — R0981 Nasal congestion: Secondary | ICD-10-CM | POA: Diagnosis not present

## 2020-03-06 DIAGNOSIS — I10 Essential (primary) hypertension: Secondary | ICD-10-CM | POA: Diagnosis not present

## 2020-03-06 DIAGNOSIS — J302 Other seasonal allergic rhinitis: Secondary | ICD-10-CM | POA: Diagnosis not present

## 2020-03-06 DIAGNOSIS — R5381 Other malaise: Secondary | ICD-10-CM | POA: Diagnosis not present

## 2020-03-06 DIAGNOSIS — I2510 Atherosclerotic heart disease of native coronary artery without angina pectoris: Secondary | ICD-10-CM | POA: Diagnosis not present

## 2020-03-06 DIAGNOSIS — R262 Difficulty in walking, not elsewhere classified: Secondary | ICD-10-CM | POA: Diagnosis not present

## 2020-03-10 DIAGNOSIS — I2510 Atherosclerotic heart disease of native coronary artery without angina pectoris: Secondary | ICD-10-CM | POA: Diagnosis not present

## 2020-03-10 DIAGNOSIS — F0150 Vascular dementia without behavioral disturbance: Secondary | ICD-10-CM | POA: Diagnosis not present

## 2020-03-10 DIAGNOSIS — I4891 Unspecified atrial fibrillation: Secondary | ICD-10-CM | POA: Diagnosis not present

## 2020-03-10 DIAGNOSIS — R262 Difficulty in walking, not elsewhere classified: Secondary | ICD-10-CM | POA: Diagnosis not present

## 2020-03-10 DIAGNOSIS — R0981 Nasal congestion: Secondary | ICD-10-CM | POA: Diagnosis not present

## 2020-03-10 DIAGNOSIS — M6281 Muscle weakness (generalized): Secondary | ICD-10-CM | POA: Diagnosis not present

## 2020-03-10 DIAGNOSIS — I10 Essential (primary) hypertension: Secondary | ICD-10-CM | POA: Diagnosis not present

## 2020-03-10 DIAGNOSIS — R5381 Other malaise: Secondary | ICD-10-CM | POA: Diagnosis not present

## 2020-03-11 DIAGNOSIS — R5381 Other malaise: Secondary | ICD-10-CM | POA: Diagnosis not present

## 2020-03-11 DIAGNOSIS — R262 Difficulty in walking, not elsewhere classified: Secondary | ICD-10-CM | POA: Diagnosis not present

## 2020-03-11 DIAGNOSIS — R0981 Nasal congestion: Secondary | ICD-10-CM | POA: Diagnosis not present

## 2020-03-11 DIAGNOSIS — I4891 Unspecified atrial fibrillation: Secondary | ICD-10-CM | POA: Diagnosis not present

## 2020-03-11 DIAGNOSIS — I10 Essential (primary) hypertension: Secondary | ICD-10-CM | POA: Diagnosis not present

## 2020-03-11 DIAGNOSIS — I2510 Atherosclerotic heart disease of native coronary artery without angina pectoris: Secondary | ICD-10-CM | POA: Diagnosis not present

## 2020-03-11 DIAGNOSIS — F0150 Vascular dementia without behavioral disturbance: Secondary | ICD-10-CM | POA: Diagnosis not present

## 2020-03-11 DIAGNOSIS — M6281 Muscle weakness (generalized): Secondary | ICD-10-CM | POA: Diagnosis not present

## 2020-03-12 DIAGNOSIS — I2510 Atherosclerotic heart disease of native coronary artery without angina pectoris: Secondary | ICD-10-CM | POA: Diagnosis not present

## 2020-03-12 DIAGNOSIS — F0150 Vascular dementia without behavioral disturbance: Secondary | ICD-10-CM | POA: Diagnosis not present

## 2020-03-12 DIAGNOSIS — I4891 Unspecified atrial fibrillation: Secondary | ICD-10-CM | POA: Diagnosis not present

## 2020-03-12 DIAGNOSIS — R5381 Other malaise: Secondary | ICD-10-CM | POA: Diagnosis not present

## 2020-03-12 DIAGNOSIS — M6281 Muscle weakness (generalized): Secondary | ICD-10-CM | POA: Diagnosis not present

## 2020-03-12 DIAGNOSIS — R0981 Nasal congestion: Secondary | ICD-10-CM | POA: Diagnosis not present

## 2020-03-12 DIAGNOSIS — R262 Difficulty in walking, not elsewhere classified: Secondary | ICD-10-CM | POA: Diagnosis not present

## 2020-03-12 DIAGNOSIS — I10 Essential (primary) hypertension: Secondary | ICD-10-CM | POA: Diagnosis not present

## 2020-03-13 DIAGNOSIS — I2510 Atherosclerotic heart disease of native coronary artery without angina pectoris: Secondary | ICD-10-CM | POA: Diagnosis not present

## 2020-03-13 DIAGNOSIS — I4891 Unspecified atrial fibrillation: Secondary | ICD-10-CM | POA: Diagnosis not present

## 2020-03-13 DIAGNOSIS — F0150 Vascular dementia without behavioral disturbance: Secondary | ICD-10-CM | POA: Diagnosis not present

## 2020-03-13 DIAGNOSIS — M6281 Muscle weakness (generalized): Secondary | ICD-10-CM | POA: Diagnosis not present

## 2020-03-13 DIAGNOSIS — R0981 Nasal congestion: Secondary | ICD-10-CM | POA: Diagnosis not present

## 2020-03-13 DIAGNOSIS — R5381 Other malaise: Secondary | ICD-10-CM | POA: Diagnosis not present

## 2020-03-13 DIAGNOSIS — R262 Difficulty in walking, not elsewhere classified: Secondary | ICD-10-CM | POA: Diagnosis not present

## 2020-03-13 DIAGNOSIS — I10 Essential (primary) hypertension: Secondary | ICD-10-CM | POA: Diagnosis not present

## 2020-03-14 DIAGNOSIS — R5381 Other malaise: Secondary | ICD-10-CM | POA: Diagnosis not present

## 2020-03-14 DIAGNOSIS — M6281 Muscle weakness (generalized): Secondary | ICD-10-CM | POA: Diagnosis not present

## 2020-03-14 DIAGNOSIS — I4891 Unspecified atrial fibrillation: Secondary | ICD-10-CM | POA: Diagnosis not present

## 2020-03-14 DIAGNOSIS — I2510 Atherosclerotic heart disease of native coronary artery without angina pectoris: Secondary | ICD-10-CM | POA: Diagnosis not present

## 2020-03-14 DIAGNOSIS — I10 Essential (primary) hypertension: Secondary | ICD-10-CM | POA: Diagnosis not present

## 2020-03-14 DIAGNOSIS — R0981 Nasal congestion: Secondary | ICD-10-CM | POA: Diagnosis not present

## 2020-03-14 DIAGNOSIS — F0150 Vascular dementia without behavioral disturbance: Secondary | ICD-10-CM | POA: Diagnosis not present

## 2020-03-14 DIAGNOSIS — R262 Difficulty in walking, not elsewhere classified: Secondary | ICD-10-CM | POA: Diagnosis not present

## 2020-03-18 DIAGNOSIS — M6281 Muscle weakness (generalized): Secondary | ICD-10-CM | POA: Diagnosis not present

## 2020-03-18 DIAGNOSIS — R296 Repeated falls: Secondary | ICD-10-CM | POA: Diagnosis not present

## 2020-03-18 DIAGNOSIS — I4891 Unspecified atrial fibrillation: Secondary | ICD-10-CM | POA: Diagnosis not present

## 2020-03-18 DIAGNOSIS — R0981 Nasal congestion: Secondary | ICD-10-CM | POA: Diagnosis not present

## 2020-03-18 DIAGNOSIS — F0150 Vascular dementia without behavioral disturbance: Secondary | ICD-10-CM | POA: Diagnosis not present

## 2020-03-18 DIAGNOSIS — R630 Anorexia: Secondary | ICD-10-CM | POA: Diagnosis not present

## 2020-03-18 DIAGNOSIS — I10 Essential (primary) hypertension: Secondary | ICD-10-CM | POA: Diagnosis not present

## 2020-03-18 DIAGNOSIS — R262 Difficulty in walking, not elsewhere classified: Secondary | ICD-10-CM | POA: Diagnosis not present

## 2020-03-18 DIAGNOSIS — F603 Borderline personality disorder: Secondary | ICD-10-CM | POA: Diagnosis not present

## 2020-03-18 DIAGNOSIS — R5381 Other malaise: Secondary | ICD-10-CM | POA: Diagnosis not present

## 2020-03-18 DIAGNOSIS — R451 Restlessness and agitation: Secondary | ICD-10-CM | POA: Diagnosis not present

## 2020-03-18 DIAGNOSIS — G4700 Insomnia, unspecified: Secondary | ICD-10-CM | POA: Diagnosis not present

## 2020-03-18 DIAGNOSIS — I2510 Atherosclerotic heart disease of native coronary artery without angina pectoris: Secondary | ICD-10-CM | POA: Diagnosis not present

## 2020-03-25 DIAGNOSIS — F603 Borderline personality disorder: Secondary | ICD-10-CM | POA: Diagnosis not present

## 2020-03-25 DIAGNOSIS — R630 Anorexia: Secondary | ICD-10-CM | POA: Diagnosis not present

## 2020-03-25 DIAGNOSIS — F0150 Vascular dementia without behavioral disturbance: Secondary | ICD-10-CM | POA: Diagnosis not present

## 2020-03-25 DIAGNOSIS — R451 Restlessness and agitation: Secondary | ICD-10-CM | POA: Diagnosis not present

## 2020-03-25 DIAGNOSIS — G4700 Insomnia, unspecified: Secondary | ICD-10-CM | POA: Diagnosis not present

## 2020-03-28 ENCOUNTER — Other Ambulatory Visit: Payer: Self-pay

## 2020-03-28 ENCOUNTER — Non-Acute Institutional Stay: Payer: Medicare HMO | Admitting: Primary Care

## 2020-04-01 DIAGNOSIS — F603 Borderline personality disorder: Secondary | ICD-10-CM | POA: Diagnosis not present

## 2020-04-01 DIAGNOSIS — G4700 Insomnia, unspecified: Secondary | ICD-10-CM | POA: Diagnosis not present

## 2020-04-01 DIAGNOSIS — R451 Restlessness and agitation: Secondary | ICD-10-CM | POA: Diagnosis not present

## 2020-04-01 DIAGNOSIS — R630 Anorexia: Secondary | ICD-10-CM | POA: Diagnosis not present

## 2020-04-01 DIAGNOSIS — F0150 Vascular dementia without behavioral disturbance: Secondary | ICD-10-CM | POA: Diagnosis not present

## 2020-04-02 ENCOUNTER — Non-Acute Institutional Stay: Payer: Medicare HMO | Admitting: Primary Care

## 2020-04-02 ENCOUNTER — Other Ambulatory Visit: Payer: Self-pay

## 2020-04-02 DIAGNOSIS — Z515 Encounter for palliative care: Secondary | ICD-10-CM | POA: Insufficient documentation

## 2020-04-02 DIAGNOSIS — F03C0 Unspecified dementia, severe, without behavioral disturbance, psychotic disturbance, mood disturbance, and anxiety: Secondary | ICD-10-CM

## 2020-04-02 DIAGNOSIS — F0390 Unspecified dementia without behavioral disturbance: Secondary | ICD-10-CM | POA: Diagnosis not present

## 2020-04-02 NOTE — Progress Notes (Signed)
[facility] Consult Note Telephone: [phone]  Fax: [phone]  PATIENT NAME: Tara Huffman [address] [place] [address] [phone] (home)  DOB: 29-Dec-1940 MRN: [id number]  PRIMARY CARE PROVIDER:    Juluis Pitch, MD,  [address] [place] [address] [phone]  REFERRING PROVIDER:   Juluis Pitch, MD [address] [place],  [place] [address] [phone]  RESPONSIBLE PARTY:   Extended Emergency Contact Information Primary Emergency Contact: Carmelina Paddock [place] of [place] Phone: [phone] Mobile Phone: [phone] Relation: Daughter Secondary Emergency Contact: Moore,Carolyn  [place] Phone: [phone] Relation: Sister  I met with patient in the facility.  ASSESSMENT AND RECOMMENDATIONS:    1. Advance Care Planning/Goals of Care: Goals include to maximize quality of life and symptom management. ACP on file, MOST with DNR, comfort measures, limited use of abx and IV, no feeding tube.    2. Symptom Management:   I met with Tara Huffman in her nursing home room. She was alert and oriented times one. She was chatting with her roommate who stated she was trying to take her water. I gave Mrs. Giebel the water from her table and encouraged her to drink fluids over the day. She appears at her in her usual state of health since she has been a resident of [facility]. She denied any pains or concerns. Staff did not have concerns at this time. Her dementia level appears to be stable at this point, and she is comfortable in the SNF setting.Continue to follow for palliative assessment/goals of care.  3. Family /Caregiver/Community Supports: Daughter is POA. Pt is resident in LTC due to  advanced dementia, other care needs.   4. Cognitive / Functional decline: At recent baseline. Ambulates by wheel chair. Not able to do any IADLs, needs assistance with most adls.  5. Follow  up Palliative Care Visit: Palliative care will continue to follow for goals of care clarification and symptom management. Return 8 weeks or prn.  I spent 25 minutes providing this consultation,  from 1030 to 1055. More than 50% of the time in this consultation was spent coordinating communication.   HISTORY OF PRESENT ILLNESS:  Tara Huffman is a 79 y.o. year old female with multiple medical problems including alcoholism, dementia, frequent falls. Palliative Care was asked to follow this patient by consultation request of Juluis Pitch, MD to help address advance care planning and goals of care. This is a follow up visit.  CODE STATUS: DNR  PPS: 30%  HOSPICE ELIGIBILITY/DIAGNOSIS: TBD  PAST MEDICAL HISTORY:  Past Medical History:  Diagnosis Date  . Allergy   . Dementia ([place])   . Hyperlipidemia   . Hypertension   . Insomnia   . Insomnia     SOCIAL HX:  Social History   Tobacco Use  . Smoking status: Never Smoker  . Smokeless tobacco: Never Used  Substance Use Topics  . Alcohol use: Yes    Alcohol/week: 14.0 standard drinks    Types: 14 Cans of beer per week    ALLERGIES:  Allergies  Allergen Reactions  . Ambien [Zolpidem]     Sleep walking     PERTINENT MEDICATIONS:  Outpatient Encounter Medications as of 04/02/2020  Medication Sig  . Amino Acids-Protein Hydrolys (FEEDING SUPPLEMENT, PRO-STAT SUGAR FREE 64,) LIQD Take 30 mLs by mouth 3 (three) times daily with meals.  Marland Kitchen ascorbic acid (VITAMIN C) 500 MG tablet Take 500 mg by mouth daily.  Marland Kitchen  atenolol (TENORMIN) 50 MG tablet TAKE 1 TABLET (50 MG TOTAL) BY MOUTH DAILY.  Marland Kitchen donepezil (ARICEPT) 10 MG tablet TAKE 1 TABLET BY MOUTH EVERY DAY AT NIGHT (Patient taking differently: Take 10 mg by mouth at bedtime. TAKE 1 TABLET BY MOUTH EVERY DAY AT NIGHT)  . fluticasone (FLONASE) 50 MCG/ACT nasal spray SPRAY 2 SPRAY IN EACH NOSTRIL DAILY (Patient taking differently: Place 2 sprays into both nostrils daily. SPRAY 2 SPRAY IN  EACH NOSTRIL DAILY)  . Multiple Vitamin (MULTIVITAMIN) tablet Take 1 tablet by mouth daily.  . polyethylene glycol (MIRALAX / GLYCOLAX) 17 g packet Take 17 g by mouth daily.  . QUEtiapine (SEROQUEL) 25 MG tablet TAKE 1 TABLET (25 MG TOTAL) BY MOUTH AT BEDTIME.  Marland Kitchen senna (SENOKOT) 8.6 MG tablet Take 1 tablet by mouth daily.   No facility-administered encounter medications on file as of 04/02/2020.    PHYSICAL EXAM / ROS:   Current and past weights: subjectively stable General: NAD, frail appearing, thin Cardiovascular: no chest pain reported, no edema  Pulmonary: no cough, no increased SOB, room air Abdomen: appetite fair, incontinent of bowel GU: denies dysuria, incontinent of urine MSK: + joint and ROM abnormalities, non ambulatory Skin: no rashes or wounds reported Neurological: Weakness, advanced dementia  Jason Coop, NP [facility]  COVID-19 PATIENT SCREENING TOOL  Person answering questions: ____________Staff_______ _____   1.  Is the patient or any family member in the home showing any signs or symptoms regarding respiratory infection?               Person with Symptom- __________NA_________________  a. Fever                                                                          Yes___ No___          ___________________  b. Shortness of breath                                                    Yes___ No___          ___________________ c. Cough/congestion                                       Yes___  No___         ___________________ d. Body aches/pains                                                         Yes___ No___        ____________________ e. Gastrointestinal symptoms (diarrhea, nausea)           Yes___ No___        ____________________  2. Within the past 14 days, has anyone living in the home had any contact with someone with or under investigation for COVID-19?  Yes___ No_X_   Person __________________

## 2020-04-07 DIAGNOSIS — R569 Unspecified convulsions: Secondary | ICD-10-CM | POA: Diagnosis not present

## 2020-04-07 DIAGNOSIS — F0150 Vascular dementia without behavioral disturbance: Secondary | ICD-10-CM | POA: Diagnosis not present

## 2020-04-29 DIAGNOSIS — R630 Anorexia: Secondary | ICD-10-CM | POA: Diagnosis not present

## 2020-04-29 DIAGNOSIS — R451 Restlessness and agitation: Secondary | ICD-10-CM | POA: Diagnosis not present

## 2020-04-29 DIAGNOSIS — F0150 Vascular dementia without behavioral disturbance: Secondary | ICD-10-CM | POA: Diagnosis not present

## 2020-04-29 DIAGNOSIS — F0391 Unspecified dementia with behavioral disturbance: Secondary | ICD-10-CM | POA: Diagnosis not present

## 2020-04-29 DIAGNOSIS — I8311 Varicose veins of right lower extremity with inflammation: Secondary | ICD-10-CM | POA: Diagnosis not present

## 2020-04-29 DIAGNOSIS — I8312 Varicose veins of left lower extremity with inflammation: Secondary | ICD-10-CM | POA: Diagnosis not present

## 2020-04-29 DIAGNOSIS — B351 Tinea unguium: Secondary | ICD-10-CM | POA: Diagnosis not present

## 2020-04-29 DIAGNOSIS — G4700 Insomnia, unspecified: Secondary | ICD-10-CM | POA: Diagnosis not present

## 2020-04-29 DIAGNOSIS — I739 Peripheral vascular disease, unspecified: Secondary | ICD-10-CM | POA: Diagnosis not present

## 2020-04-29 DIAGNOSIS — F603 Borderline personality disorder: Secondary | ICD-10-CM | POA: Diagnosis not present

## 2020-05-01 DIAGNOSIS — I2510 Atherosclerotic heart disease of native coronary artery without angina pectoris: Secondary | ICD-10-CM | POA: Diagnosis not present

## 2020-05-01 DIAGNOSIS — F0390 Unspecified dementia without behavioral disturbance: Secondary | ICD-10-CM | POA: Diagnosis not present

## 2020-05-01 DIAGNOSIS — Z8679 Personal history of other diseases of the circulatory system: Secondary | ICD-10-CM | POA: Diagnosis not present

## 2020-05-01 DIAGNOSIS — J302 Other seasonal allergic rhinitis: Secondary | ICD-10-CM | POA: Diagnosis not present

## 2020-05-01 DIAGNOSIS — I10 Essential (primary) hypertension: Secondary | ICD-10-CM | POA: Diagnosis not present

## 2020-05-02 DIAGNOSIS — D649 Anemia, unspecified: Secondary | ICD-10-CM | POA: Diagnosis not present

## 2020-05-02 DIAGNOSIS — R569 Unspecified convulsions: Secondary | ICD-10-CM | POA: Diagnosis not present

## 2020-05-02 DIAGNOSIS — I10 Essential (primary) hypertension: Secondary | ICD-10-CM | POA: Diagnosis not present

## 2020-05-27 DIAGNOSIS — F603 Borderline personality disorder: Secondary | ICD-10-CM | POA: Diagnosis not present

## 2020-05-27 DIAGNOSIS — F0391 Unspecified dementia with behavioral disturbance: Secondary | ICD-10-CM | POA: Diagnosis not present

## 2020-05-27 DIAGNOSIS — G4700 Insomnia, unspecified: Secondary | ICD-10-CM | POA: Diagnosis not present

## 2020-05-27 DIAGNOSIS — R451 Restlessness and agitation: Secondary | ICD-10-CM | POA: Diagnosis not present

## 2020-05-27 DIAGNOSIS — F0150 Vascular dementia without behavioral disturbance: Secondary | ICD-10-CM | POA: Diagnosis not present

## 2020-06-04 ENCOUNTER — Other Ambulatory Visit: Payer: Self-pay

## 2020-06-04 ENCOUNTER — Non-Acute Institutional Stay: Payer: Medicare HMO | Admitting: Primary Care

## 2020-06-04 DIAGNOSIS — Z515 Encounter for palliative care: Secondary | ICD-10-CM | POA: Diagnosis not present

## 2020-06-04 DIAGNOSIS — F0390 Unspecified dementia without behavioral disturbance: Secondary | ICD-10-CM

## 2020-06-04 DIAGNOSIS — F03C0 Unspecified dementia, severe, without behavioral disturbance, psychotic disturbance, mood disturbance, and anxiety: Secondary | ICD-10-CM

## 2020-06-04 NOTE — Progress Notes (Signed)
[facility] Consult Note Telephone: [phone]  Fax: [phone]  PATIENT NAME: Tara Huffman [address] [place] [address] [phone] (home)  DOB: Aug 02, 1941 MRN: [id number]  PRIMARY CARE PROVIDER:    Juluis Pitch, MD,  [address] [place] [address] [phone]  REFERRING PROVIDER:   Juluis Pitch, MD [address] [place],  [place] [facility] [place] [address] [phone]  RESPONSIBLE PARTY:   Extended Emergency Contact Information Primary Emergency Contact: Tara Huffman States of [place] Phone: [phone] Mobile Phone: [phone] Relation: Daughter Secondary Emergency Contact: Tara Huffman,Tara Huffman  [place] Phone: [phone] Relation: Sister  I met face to face with patient in facility. I spoke with Tara Huffman her daughter and POA. She is concerned about medications making pt too subdued, and would like d/c of psychiatric meds now. She is needing assistance with feeds at each meal. I asked her to notify DON to discuss POC at SNF.   ASSESSMENT AND RECOMMENDATIONS:   1. Advance Care Planning/Goals of Care: Goals include to maximize quality of life and symptom management. I feel she would benefit from hospice admission but POA feels she's not in disease decline but taking too many medications. We discussed possibly she could be having both scenarios. PoA would like to wean more of her medications such as ativan and depakote. We discussed need to wean over time.She is also concerned pt is not being offered PO frequently enough. I let her know about compassionate feeding program, and to see if she could gain access to assist with meals via this program. I encouraged POA to discuss plan of care with DON to address these concerns of intake and over medication.  2. Symptom Management:   Poor PO intake: Needs full assist with meals each meal, and offering of liquids to equal intake of 32-48 oz daily.  Recommend supplements and staff to offer as she is weak and can no longer feed self. Wt is now 95 lbs,  And she often is too lethargic for po intake.   Today I witnessed some tremors/rigors. VS were WNL altho temp was slightly elevated. I asked staff to give prn acetaminophen and notify family and MD for monitoring.  She could not report sx.  3. Follow up Palliative Care Visit: Palliative care will continue to follow for goals of care clarification and symptom management. Return 1-2 weeks or prn. Recommend hospice admission if consistent with family wishes.   4. Family /Caregiver/Community Supports:  Daughter is Designer, industrial/product, lives in [place]. Recommend compassionate care visits and feedings. Also Hospice appropriate if consistent with family goals of care.  5. Cognitive / Functional decline:  Lethargic, answers yes to name. Dependent in all adls, iadls.  I spent 50 minutes providing this consultation,  from 1600 to 1650. More than 50% of the time in this consultation was spent coordinating communication.   CHIEF COMPLAINT: protein calorie malnutrition  HISTORY OF PRESENT ILLNESS:  Tara Huffman is a 79 y.o. year old female with multiple medical problems including advanced dementia,  H/o alcoholism, abnormal wt loss, agitation. Palliative Care was asked to follow this patient by consultation request of Tara Pitch, MD to help address advance care planning and goals of care. This is a follow up visit.  CODE STATUS: DNR  PPS: 30%  HOSPICE ELIGIBILITY/DIAGNOSIS: yes, wt loss  PAST MEDICAL HISTORY:  Past Medical History:  Diagnosis Date  . Allergy   . Dementia ([facility])   . Hyperlipidemia   . Hypertension   .  Insomnia   . Insomnia     SOCIAL HX:  Social History   Tobacco Use  . Smoking status: Never Smoker  . Smokeless tobacco: Never Used  Substance Use Topics  . Alcohol use: Yes    Alcohol/week: 14.0 standard drinks    Types: 14 Cans of beer per week   FAMILY HX:  Family History    Problem Relation Age of Onset  . Thyroid disease Sister     ALLERGIES:  Allergies  Allergen Reactions  . Ambien [Zolpidem]     Sleep walking     PERTINENT MEDICATIONS:  Outpatient Encounter Medications as of 06/04/2020  Medication Sig  . Amino Acids-Protein Hydrolys (FEEDING SUPPLEMENT, PRO-STAT SUGAR FREE 64,) LIQD Take 30 mLs by mouth 3 (three) times daily with meals.  Marland Kitchen ascorbic acid (VITAMIN C) 500 MG tablet Take 500 mg by mouth daily.  Marland Kitchen atenolol (TENORMIN) 50 MG tablet TAKE 1 TABLET (50 MG TOTAL) BY MOUTH DAILY.  . ciclopirox (LOPROX) 0.77 % cream Apply 1 application topically daily. To toenails  . divalproex (DEPAKOTE) 125 MG DR tablet Take 125 mg by mouth 3 (three) times daily.  Marland Kitchen donepezil (ARICEPT) 10 MG tablet TAKE 1 TABLET BY MOUTH EVERY DAY AT NIGHT (Patient taking differently: Take 10 mg by mouth at bedtime. TAKE 1 TABLET BY MOUTH EVERY DAY AT NIGHT)  . fluticasone (FLONASE) 50 MCG/ACT nasal spray SPRAY 2 SPRAY IN EACH NOSTRIL DAILY (Patient taking differently: Place 2 sprays into both nostrils daily. SPRAY 2 SPRAY IN EACH NOSTRIL DAILY)  . LORazepam (ATIVAN) 1 MG tablet Take 1 mg by mouth every 8 (eight) hours as needed for anxiety.  . magnesium hydroxide (MILK OF MAGNESIA) 400 MG/5ML suspension Take 30 mLs by mouth daily.  . melatonin 3 MG TABS tablet Take 3 mg by mouth at bedtime.  . Multiple Vitamin (MULTIVITAMIN) tablet Take 1 tablet by mouth daily.  . polyethylene glycol (MIRALAX / GLYCOLAX) 17 g packet Take 17 g by mouth daily.  . risperiDONE (RISPERDAL) 0.5 MG tablet Take 0.5 mg by mouth at bedtime.  . senna (SENOKOT) 8.6 MG tablet Take 1 tablet by mouth daily.  . traZODone (DESYREL) 150 MG tablet Take 50 mg by mouth at bedtime.  . [DISCONTINUED] QUEtiapine (SEROQUEL) 25 MG tablet TAKE 1 TABLET (25 MG TOTAL) BY MOUTH AT BEDTIME.   No facility-administered encounter medications on file as of 06/04/2020.    PHYSICAL EXAM / ROS:  Temp 99.2, 163/75 hr 76 rr  18 Current and past weights: Wt 4/21 113 lbs Wt now is 8/21 91.7 18% loss, BMI = 15.8 General: NAD, frail appearing, thin, tremors today Cardiovascular: no chest pain reported, no LE edema  Pulmonary: no cough, no SOB, room air Abdomen: appetite fair To poor, incontinent of bowel GU: denies dysuria, incontinent of urine MSK: ++ joint and ROM abnormalities,contractures, non  ambulatory Skin: no rashes or wounds reported Neurological: Weakness, dementia, min verbal, tremors, agitation at times.   Jason Coop, NP , DNP, MPH, [facility]  COVID-19 PATIENT SCREENING TOOL  Person answering questions: ___________staff______ _____   1.  Is the patient or any family member in the home showing any signs or symptoms regarding respiratory infection?               Person with Symptom- __________NA_________________  a. Fever  Yes___ No___          ___________________  b. Shortness of breath                                                    Yes___ No___          ___________________ c. Cough/congestion                                       Yes___  No___         ___________________ d. Body aches/pains                                                         Yes___ No___        ____________________ e. Gastrointestinal symptoms (diarrhea, nausea)           Yes___ No___        ____________________  2. Within the past 14 days, has anyone living in the home had any contact with someone with or under investigation for COVID-19?    Yes___ No_X_   Person __________________

## 2020-06-06 DIAGNOSIS — R2681 Unsteadiness on feet: Secondary | ICD-10-CM | POA: Diagnosis not present

## 2020-06-06 DIAGNOSIS — D649 Anemia, unspecified: Secondary | ICD-10-CM | POA: Diagnosis not present

## 2020-06-06 DIAGNOSIS — R569 Unspecified convulsions: Secondary | ICD-10-CM | POA: Diagnosis not present

## 2020-06-06 DIAGNOSIS — R1312 Dysphagia, oropharyngeal phase: Secondary | ICD-10-CM | POA: Diagnosis not present

## 2020-06-06 DIAGNOSIS — I10 Essential (primary) hypertension: Secondary | ICD-10-CM | POA: Diagnosis not present

## 2020-06-06 DIAGNOSIS — I2510 Atherosclerotic heart disease of native coronary artery without angina pectoris: Secondary | ICD-10-CM | POA: Diagnosis not present

## 2020-06-06 DIAGNOSIS — F0150 Vascular dementia without behavioral disturbance: Secondary | ICD-10-CM | POA: Diagnosis not present

## 2020-06-06 DIAGNOSIS — R918 Other nonspecific abnormal finding of lung field: Secondary | ICD-10-CM | POA: Diagnosis not present

## 2020-06-06 DIAGNOSIS — R05 Cough: Secondary | ICD-10-CM | POA: Diagnosis not present

## 2020-06-06 DIAGNOSIS — R509 Fever, unspecified: Secondary | ICD-10-CM | POA: Diagnosis not present

## 2020-06-06 DIAGNOSIS — E7089 Other disorders of aromatic amino-acid metabolism: Secondary | ICD-10-CM | POA: Diagnosis not present

## 2020-06-06 DIAGNOSIS — R488 Other symbolic dysfunctions: Secondary | ICD-10-CM | POA: Diagnosis not present

## 2020-06-06 DIAGNOSIS — Z79899 Other long term (current) drug therapy: Secondary | ICD-10-CM | POA: Diagnosis not present

## 2020-06-06 DIAGNOSIS — I4891 Unspecified atrial fibrillation: Secondary | ICD-10-CM | POA: Diagnosis not present

## 2020-06-06 DIAGNOSIS — R0981 Nasal congestion: Secondary | ICD-10-CM | POA: Diagnosis not present

## 2020-06-06 DIAGNOSIS — R498 Other voice and resonance disorders: Secondary | ICD-10-CM | POA: Diagnosis not present

## 2020-06-07 DIAGNOSIS — R05 Cough: Secondary | ICD-10-CM | POA: Diagnosis not present

## 2020-06-09 DIAGNOSIS — R488 Other symbolic dysfunctions: Secondary | ICD-10-CM | POA: Diagnosis not present

## 2020-06-09 DIAGNOSIS — R2681 Unsteadiness on feet: Secondary | ICD-10-CM | POA: Diagnosis not present

## 2020-06-09 DIAGNOSIS — R0981 Nasal congestion: Secondary | ICD-10-CM | POA: Diagnosis not present

## 2020-06-09 DIAGNOSIS — F0150 Vascular dementia without behavioral disturbance: Secondary | ICD-10-CM | POA: Diagnosis not present

## 2020-06-09 DIAGNOSIS — R1312 Dysphagia, oropharyngeal phase: Secondary | ICD-10-CM | POA: Diagnosis not present

## 2020-06-09 DIAGNOSIS — I10 Essential (primary) hypertension: Secondary | ICD-10-CM | POA: Diagnosis not present

## 2020-06-09 DIAGNOSIS — R498 Other voice and resonance disorders: Secondary | ICD-10-CM | POA: Diagnosis not present

## 2020-06-09 DIAGNOSIS — I2510 Atherosclerotic heart disease of native coronary artery without angina pectoris: Secondary | ICD-10-CM | POA: Diagnosis not present

## 2020-06-09 DIAGNOSIS — I4891 Unspecified atrial fibrillation: Secondary | ICD-10-CM | POA: Diagnosis not present

## 2020-06-10 DIAGNOSIS — I4891 Unspecified atrial fibrillation: Secondary | ICD-10-CM | POA: Diagnosis not present

## 2020-06-10 DIAGNOSIS — I2510 Atherosclerotic heart disease of native coronary artery without angina pectoris: Secondary | ICD-10-CM | POA: Diagnosis not present

## 2020-06-10 DIAGNOSIS — R1312 Dysphagia, oropharyngeal phase: Secondary | ICD-10-CM | POA: Diagnosis not present

## 2020-06-10 DIAGNOSIS — R498 Other voice and resonance disorders: Secondary | ICD-10-CM | POA: Diagnosis not present

## 2020-06-10 DIAGNOSIS — R2681 Unsteadiness on feet: Secondary | ICD-10-CM | POA: Diagnosis not present

## 2020-06-10 DIAGNOSIS — I10 Essential (primary) hypertension: Secondary | ICD-10-CM | POA: Diagnosis not present

## 2020-06-10 DIAGNOSIS — F0150 Vascular dementia without behavioral disturbance: Secondary | ICD-10-CM | POA: Diagnosis not present

## 2020-06-10 DIAGNOSIS — R0981 Nasal congestion: Secondary | ICD-10-CM | POA: Diagnosis not present

## 2020-06-10 DIAGNOSIS — R488 Other symbolic dysfunctions: Secondary | ICD-10-CM | POA: Diagnosis not present

## 2020-06-11 DIAGNOSIS — I10 Essential (primary) hypertension: Secondary | ICD-10-CM | POA: Diagnosis not present

## 2020-06-11 DIAGNOSIS — R1312 Dysphagia, oropharyngeal phase: Secondary | ICD-10-CM | POA: Diagnosis not present

## 2020-06-11 DIAGNOSIS — I2510 Atherosclerotic heart disease of native coronary artery without angina pectoris: Secondary | ICD-10-CM | POA: Diagnosis not present

## 2020-06-11 DIAGNOSIS — R2681 Unsteadiness on feet: Secondary | ICD-10-CM | POA: Diagnosis not present

## 2020-06-11 DIAGNOSIS — F0150 Vascular dementia without behavioral disturbance: Secondary | ICD-10-CM | POA: Diagnosis not present

## 2020-06-11 DIAGNOSIS — R0981 Nasal congestion: Secondary | ICD-10-CM | POA: Diagnosis not present

## 2020-06-11 DIAGNOSIS — R488 Other symbolic dysfunctions: Secondary | ICD-10-CM | POA: Diagnosis not present

## 2020-06-11 DIAGNOSIS — R498 Other voice and resonance disorders: Secondary | ICD-10-CM | POA: Diagnosis not present

## 2020-06-11 DIAGNOSIS — I4891 Unspecified atrial fibrillation: Secondary | ICD-10-CM | POA: Diagnosis not present

## 2020-06-12 DIAGNOSIS — I2510 Atherosclerotic heart disease of native coronary artery without angina pectoris: Secondary | ICD-10-CM | POA: Diagnosis not present

## 2020-06-12 DIAGNOSIS — R0981 Nasal congestion: Secondary | ICD-10-CM | POA: Diagnosis not present

## 2020-06-12 DIAGNOSIS — F0150 Vascular dementia without behavioral disturbance: Secondary | ICD-10-CM | POA: Diagnosis not present

## 2020-06-12 DIAGNOSIS — I10 Essential (primary) hypertension: Secondary | ICD-10-CM | POA: Diagnosis not present

## 2020-06-12 DIAGNOSIS — R498 Other voice and resonance disorders: Secondary | ICD-10-CM | POA: Diagnosis not present

## 2020-06-12 DIAGNOSIS — I4891 Unspecified atrial fibrillation: Secondary | ICD-10-CM | POA: Diagnosis not present

## 2020-06-12 DIAGNOSIS — R488 Other symbolic dysfunctions: Secondary | ICD-10-CM | POA: Diagnosis not present

## 2020-06-12 DIAGNOSIS — R1312 Dysphagia, oropharyngeal phase: Secondary | ICD-10-CM | POA: Diagnosis not present

## 2020-06-12 DIAGNOSIS — R2681 Unsteadiness on feet: Secondary | ICD-10-CM | POA: Diagnosis not present

## 2020-06-13 DIAGNOSIS — F0150 Vascular dementia without behavioral disturbance: Secondary | ICD-10-CM | POA: Diagnosis not present

## 2020-06-13 DIAGNOSIS — D649 Anemia, unspecified: Secondary | ICD-10-CM | POA: Diagnosis not present

## 2020-06-13 DIAGNOSIS — R498 Other voice and resonance disorders: Secondary | ICD-10-CM | POA: Diagnosis not present

## 2020-06-13 DIAGNOSIS — I4891 Unspecified atrial fibrillation: Secondary | ICD-10-CM | POA: Diagnosis not present

## 2020-06-13 DIAGNOSIS — I10 Essential (primary) hypertension: Secondary | ICD-10-CM | POA: Diagnosis not present

## 2020-06-13 DIAGNOSIS — I2510 Atherosclerotic heart disease of native coronary artery without angina pectoris: Secondary | ICD-10-CM | POA: Diagnosis not present

## 2020-06-13 DIAGNOSIS — R0981 Nasal congestion: Secondary | ICD-10-CM | POA: Diagnosis not present

## 2020-06-13 DIAGNOSIS — R1312 Dysphagia, oropharyngeal phase: Secondary | ICD-10-CM | POA: Diagnosis not present

## 2020-06-13 DIAGNOSIS — R2681 Unsteadiness on feet: Secondary | ICD-10-CM | POA: Diagnosis not present

## 2020-06-13 DIAGNOSIS — R488 Other symbolic dysfunctions: Secondary | ICD-10-CM | POA: Diagnosis not present

## 2020-06-16 DIAGNOSIS — R488 Other symbolic dysfunctions: Secondary | ICD-10-CM | POA: Diagnosis not present

## 2020-06-16 DIAGNOSIS — R1312 Dysphagia, oropharyngeal phase: Secondary | ICD-10-CM | POA: Diagnosis not present

## 2020-06-16 DIAGNOSIS — I10 Essential (primary) hypertension: Secondary | ICD-10-CM | POA: Diagnosis not present

## 2020-06-16 DIAGNOSIS — R2681 Unsteadiness on feet: Secondary | ICD-10-CM | POA: Diagnosis not present

## 2020-06-16 DIAGNOSIS — I4891 Unspecified atrial fibrillation: Secondary | ICD-10-CM | POA: Diagnosis not present

## 2020-06-16 DIAGNOSIS — R498 Other voice and resonance disorders: Secondary | ICD-10-CM | POA: Diagnosis not present

## 2020-06-16 DIAGNOSIS — I2510 Atherosclerotic heart disease of native coronary artery without angina pectoris: Secondary | ICD-10-CM | POA: Diagnosis not present

## 2020-06-16 DIAGNOSIS — F0150 Vascular dementia without behavioral disturbance: Secondary | ICD-10-CM | POA: Diagnosis not present

## 2020-06-16 DIAGNOSIS — R0981 Nasal congestion: Secondary | ICD-10-CM | POA: Diagnosis not present

## 2020-06-17 DIAGNOSIS — R498 Other voice and resonance disorders: Secondary | ICD-10-CM | POA: Diagnosis not present

## 2020-06-17 DIAGNOSIS — R1312 Dysphagia, oropharyngeal phase: Secondary | ICD-10-CM | POA: Diagnosis not present

## 2020-06-17 DIAGNOSIS — R0981 Nasal congestion: Secondary | ICD-10-CM | POA: Diagnosis not present

## 2020-06-17 DIAGNOSIS — R2681 Unsteadiness on feet: Secondary | ICD-10-CM | POA: Diagnosis not present

## 2020-06-17 DIAGNOSIS — R488 Other symbolic dysfunctions: Secondary | ICD-10-CM | POA: Diagnosis not present

## 2020-06-17 DIAGNOSIS — I2510 Atherosclerotic heart disease of native coronary artery without angina pectoris: Secondary | ICD-10-CM | POA: Diagnosis not present

## 2020-06-17 DIAGNOSIS — I10 Essential (primary) hypertension: Secondary | ICD-10-CM | POA: Diagnosis not present

## 2020-06-17 DIAGNOSIS — I4891 Unspecified atrial fibrillation: Secondary | ICD-10-CM | POA: Diagnosis not present

## 2020-06-17 DIAGNOSIS — F0150 Vascular dementia without behavioral disturbance: Secondary | ICD-10-CM | POA: Diagnosis not present

## 2020-06-18 ENCOUNTER — Non-Acute Institutional Stay: Payer: Medicare HMO | Admitting: Primary Care

## 2020-06-18 ENCOUNTER — Other Ambulatory Visit: Payer: Self-pay

## 2020-06-18 DIAGNOSIS — I10 Essential (primary) hypertension: Secondary | ICD-10-CM | POA: Diagnosis not present

## 2020-06-18 DIAGNOSIS — Z515 Encounter for palliative care: Secondary | ICD-10-CM | POA: Diagnosis not present

## 2020-06-18 DIAGNOSIS — I4891 Unspecified atrial fibrillation: Secondary | ICD-10-CM | POA: Diagnosis not present

## 2020-06-18 DIAGNOSIS — F0150 Vascular dementia without behavioral disturbance: Secondary | ICD-10-CM | POA: Diagnosis not present

## 2020-06-18 DIAGNOSIS — R488 Other symbolic dysfunctions: Secondary | ICD-10-CM | POA: Diagnosis not present

## 2020-06-18 DIAGNOSIS — R262 Difficulty in walking, not elsewhere classified: Secondary | ICD-10-CM | POA: Diagnosis not present

## 2020-06-18 DIAGNOSIS — F03C0 Unspecified dementia, severe, without behavioral disturbance, psychotic disturbance, mood disturbance, and anxiety: Secondary | ICD-10-CM

## 2020-06-18 DIAGNOSIS — R1312 Dysphagia, oropharyngeal phase: Secondary | ICD-10-CM | POA: Diagnosis not present

## 2020-06-18 DIAGNOSIS — R0981 Nasal congestion: Secondary | ICD-10-CM | POA: Diagnosis not present

## 2020-06-18 DIAGNOSIS — I2510 Atherosclerotic heart disease of native coronary artery without angina pectoris: Secondary | ICD-10-CM | POA: Diagnosis not present

## 2020-06-18 DIAGNOSIS — F0390 Unspecified dementia without behavioral disturbance: Secondary | ICD-10-CM | POA: Diagnosis not present

## 2020-06-18 NOTE — Progress Notes (Signed)
[facility] Consult Note Telephone: [phone]  Fax: [phone]  PATIENT NAME: Tara Huffman [address] [place] [address] [phone] (home)  DOB: 1941-02-26 MRN: [id number]  PRIMARY CARE PROVIDER:    Juluis Pitch, MD,  [address] [place] [address] [phone]  REFERRING PROVIDER:   Juluis Pitch, MD [address] [place],  [place] [address] [phone]  RESPONSIBLE PARTY:   Extended Emergency Contact Information Primary Emergency Contact: Carmelina Paddock [place] of [place] Phone: [phone] Mobile Phone: [phone] Relation: Daughter Secondary Emergency Contact: Moore,Carolyn  [place] Phone: [phone] Relation: Sister  I met face to face with patient in the facility.  ASSESSMENT AND RECOMMENDATIONS:   1. Advance Care Planning/Goals of Care: Goals include to maximize quality of life and symptom management. MOST on file with  Dnr, comfort measures.  2. Symptom Management:  Today patient is alert, able to interact. States she is cold, receiving a bath currently. I am following her today to assess her decline. Last week I spoke to daughter who was concerned about her intake. This week weight is slightly improved but she does exhibit over all decline. She sometimes takes off her oxygen, and is confused and agitated at times.   3. Follow up Palliative Care Visit: Palliative care will continue to follow for goals of care clarification and symptom management. Return 2-4 weeks or prn.  4. Family /Caregiver/Community Supports: Daughter is Designer, industrial/product, lives in [place].  5. Cognitive / Functional decline: Advanced dementia, dependent in all adls. Needs to be fed.  I spent 25 minutes providing this consultation,  from 1000 to 1025. More than 50% of the time in this consultation was spent coordinating communication.   CHIEF COMPLAINT: Debility, wt loss, dementia  HISTORY OF PRESENT ILLNESS:   Tara Huffman is a 79 y.o. year old female with multiple medical problems including alcoholic dementia, pressure injury, protein calorie malnutrition, wt loss. . Palliative Care was asked to follow this patient by consultation request of Juluis Pitch, MD to help address advance care planning and goals of care. This is a follow up visit.  CODE STATUS: DNR  PPS: 30%  HOSPICE ELIGIBILITY/DIAGNOSIS: TBD  PAST MEDICAL HISTORY:  Past Medical History:  Diagnosis Date   Allergy    Dementia ([facility])    Hyperlipidemia    Hypertension    Insomnia    Insomnia     SOCIAL HX:  Social History   Tobacco Use   Smoking status: Never Smoker   Smokeless tobacco: Never Used  Substance Use Topics   Alcohol use: Yes    Alcohol/week: 14.0 standard drinks    Types: 14 Cans of beer per week   FAMILY HX:  Family History  Problem Relation Age of Onset   Thyroid disease Sister     ALLERGIES:  Allergies  Allergen Reactions   Ambien [Zolpidem]     Sleep walking     PERTINENT MEDICATIONS:  Outpatient Encounter Medications as of 06/18/2020  Medication Sig   Amino Acids-Protein Hydrolys (FEEDING SUPPLEMENT, PRO-STAT SUGAR FREE 64,) LIQD Take 30 mLs by mouth 3 (three) times daily with meals.   ascorbic acid (VITAMIN C) 500 MG tablet Take 500 mg by mouth daily.   atenolol (TENORMIN) 50 MG tablet TAKE 1 TABLET (50 MG TOTAL) BY MOUTH DAILY.   ciclopirox (LOPROX) 0.77 % cream Apply 1 application topically daily. To toenails   divalproex (DEPAKOTE) 125 MG DR tablet Take 125 mg by mouth  3 (three) times daily.   donepezil (ARICEPT) 10 MG tablet TAKE 1 TABLET BY MOUTH EVERY DAY AT NIGHT (Patient taking differently: Take 10 mg by mouth at bedtime. TAKE 1 TABLET BY MOUTH EVERY DAY AT NIGHT)   fluticasone (FLONASE) 50 MCG/ACT nasal spray SPRAY 2 SPRAY IN EACH NOSTRIL DAILY (Patient taking differently: Place 2 sprays into both nostrils daily. SPRAY 2 SPRAY IN EACH NOSTRIL DAILY)    LORazepam (ATIVAN) 1 MG tablet Take 1 mg by mouth every 8 (eight) hours as needed for anxiety.   magnesium hydroxide (MILK OF MAGNESIA) 400 MG/5ML suspension Take 30 mLs by mouth daily.   melatonin 3 MG TABS tablet Take 3 mg by mouth at bedtime.   Multiple Vitamin (MULTIVITAMIN) tablet Take 1 tablet by mouth daily.   polyethylene glycol (MIRALAX / GLYCOLAX) 17 g packet Take 17 g by mouth daily.   risperiDONE (RISPERDAL) 0.5 MG tablet Take 0.5 mg by mouth at bedtime.   senna (SENOKOT) 8.6 MG tablet Take 1 tablet by mouth daily.   traZODone (DESYREL) 150 MG tablet Take 50 mg by mouth at bedtime.   No facility-administered encounter medications on file as of 06/18/2020.    PHYSICAL EXAM / ROS:   Current and past weights: 91.9lbs,  96 lbs General: NAD, frail appearing, thin Cardiovascular: S1S2, no edema  Pulmonary: no cough, no increased SOB, oxygen 2 L Abdomen: appetite fair, denies constipation, incontinent of bowel GU: denies dysuria, incontinent of urine MSK:  ++ joint and ROM abnormalities, LE contractures, non ambulatory Skin: left ischial pressure  wound Neurological: Weakness, advanced dementia, alert and oriented x 1, verbal today  Jason Coop, NP , DNP, MPH, [facility]  COVID-19 PATIENT SCREENING TOOL  Person answering questions: ___________staff_____ _____   1.  Is the patient or any family member in the home showing any signs or symptoms regarding respiratory infection?               Person with Symptom- __________NA_________________  a. Fever                                                                          Yes___ No___          ___________________  b. Shortness of breath                                                    Yes___ No___          ___________________ c. Cough/congestion                                       Yes___  No___         ___________________ d. Body aches/pains  Yes___ No___         ____________________ e. Gastrointestinal symptoms (diarrhea, nausea)           Yes___ No___        ____________________  2. Within the past 14 days, has anyone living in the home had any contact with someone with or under investigation for COVID-19?    Yes___ No_X_   Person __________________

## 2020-06-19 DIAGNOSIS — R0981 Nasal congestion: Secondary | ICD-10-CM | POA: Diagnosis not present

## 2020-06-19 DIAGNOSIS — I4891 Unspecified atrial fibrillation: Secondary | ICD-10-CM | POA: Diagnosis not present

## 2020-06-19 DIAGNOSIS — R262 Difficulty in walking, not elsewhere classified: Secondary | ICD-10-CM | POA: Diagnosis not present

## 2020-06-19 DIAGNOSIS — I10 Essential (primary) hypertension: Secondary | ICD-10-CM | POA: Diagnosis not present

## 2020-06-19 DIAGNOSIS — R488 Other symbolic dysfunctions: Secondary | ICD-10-CM | POA: Diagnosis not present

## 2020-06-19 DIAGNOSIS — F0150 Vascular dementia without behavioral disturbance: Secondary | ICD-10-CM | POA: Diagnosis not present

## 2020-06-19 DIAGNOSIS — R1312 Dysphagia, oropharyngeal phase: Secondary | ICD-10-CM | POA: Diagnosis not present

## 2020-06-19 DIAGNOSIS — L89150 Pressure ulcer of sacral region, unstageable: Secondary | ICD-10-CM | POA: Diagnosis not present

## 2020-06-19 DIAGNOSIS — I2510 Atherosclerotic heart disease of native coronary artery without angina pectoris: Secondary | ICD-10-CM | POA: Diagnosis not present

## 2020-06-20 DIAGNOSIS — R1312 Dysphagia, oropharyngeal phase: Secondary | ICD-10-CM | POA: Diagnosis not present

## 2020-06-20 DIAGNOSIS — R262 Difficulty in walking, not elsewhere classified: Secondary | ICD-10-CM | POA: Diagnosis not present

## 2020-06-20 DIAGNOSIS — F0150 Vascular dementia without behavioral disturbance: Secondary | ICD-10-CM | POA: Diagnosis not present

## 2020-06-20 DIAGNOSIS — I10 Essential (primary) hypertension: Secondary | ICD-10-CM | POA: Diagnosis not present

## 2020-06-20 DIAGNOSIS — I2510 Atherosclerotic heart disease of native coronary artery without angina pectoris: Secondary | ICD-10-CM | POA: Diagnosis not present

## 2020-06-20 DIAGNOSIS — I4891 Unspecified atrial fibrillation: Secondary | ICD-10-CM | POA: Diagnosis not present

## 2020-06-20 DIAGNOSIS — R0981 Nasal congestion: Secondary | ICD-10-CM | POA: Diagnosis not present

## 2020-06-20 DIAGNOSIS — R488 Other symbolic dysfunctions: Secondary | ICD-10-CM | POA: Diagnosis not present

## 2020-06-23 DIAGNOSIS — R1312 Dysphagia, oropharyngeal phase: Secondary | ICD-10-CM | POA: Diagnosis not present

## 2020-06-23 DIAGNOSIS — R488 Other symbolic dysfunctions: Secondary | ICD-10-CM | POA: Diagnosis not present

## 2020-06-23 DIAGNOSIS — R300 Dysuria: Secondary | ICD-10-CM | POA: Diagnosis not present

## 2020-06-23 DIAGNOSIS — R0981 Nasal congestion: Secondary | ICD-10-CM | POA: Diagnosis not present

## 2020-06-23 DIAGNOSIS — N390 Urinary tract infection, site not specified: Secondary | ICD-10-CM | POA: Diagnosis not present

## 2020-06-23 DIAGNOSIS — D509 Iron deficiency anemia, unspecified: Secondary | ICD-10-CM | POA: Diagnosis not present

## 2020-06-23 DIAGNOSIS — I2510 Atherosclerotic heart disease of native coronary artery without angina pectoris: Secondary | ICD-10-CM | POA: Diagnosis not present

## 2020-06-23 DIAGNOSIS — I4891 Unspecified atrial fibrillation: Secondary | ICD-10-CM | POA: Diagnosis not present

## 2020-06-23 DIAGNOSIS — R262 Difficulty in walking, not elsewhere classified: Secondary | ICD-10-CM | POA: Diagnosis not present

## 2020-06-23 DIAGNOSIS — I10 Essential (primary) hypertension: Secondary | ICD-10-CM | POA: Diagnosis not present

## 2020-06-23 DIAGNOSIS — F0150 Vascular dementia without behavioral disturbance: Secondary | ICD-10-CM | POA: Diagnosis not present

## 2020-06-24 DIAGNOSIS — I10 Essential (primary) hypertension: Secondary | ICD-10-CM | POA: Diagnosis not present

## 2020-06-24 DIAGNOSIS — R488 Other symbolic dysfunctions: Secondary | ICD-10-CM | POA: Diagnosis not present

## 2020-06-24 DIAGNOSIS — R262 Difficulty in walking, not elsewhere classified: Secondary | ICD-10-CM | POA: Diagnosis not present

## 2020-06-24 DIAGNOSIS — F0150 Vascular dementia without behavioral disturbance: Secondary | ICD-10-CM | POA: Diagnosis not present

## 2020-06-24 DIAGNOSIS — R0981 Nasal congestion: Secondary | ICD-10-CM | POA: Diagnosis not present

## 2020-06-24 DIAGNOSIS — I2510 Atherosclerotic heart disease of native coronary artery without angina pectoris: Secondary | ICD-10-CM | POA: Diagnosis not present

## 2020-06-24 DIAGNOSIS — N390 Urinary tract infection, site not specified: Secondary | ICD-10-CM | POA: Diagnosis not present

## 2020-06-24 DIAGNOSIS — I4891 Unspecified atrial fibrillation: Secondary | ICD-10-CM | POA: Diagnosis not present

## 2020-06-24 DIAGNOSIS — R1312 Dysphagia, oropharyngeal phase: Secondary | ICD-10-CM | POA: Diagnosis not present

## 2020-06-25 DIAGNOSIS — I2510 Atherosclerotic heart disease of native coronary artery without angina pectoris: Secondary | ICD-10-CM | POA: Diagnosis not present

## 2020-06-25 DIAGNOSIS — F0150 Vascular dementia without behavioral disturbance: Secondary | ICD-10-CM | POA: Diagnosis not present

## 2020-06-25 DIAGNOSIS — R1312 Dysphagia, oropharyngeal phase: Secondary | ICD-10-CM | POA: Diagnosis not present

## 2020-06-25 DIAGNOSIS — R488 Other symbolic dysfunctions: Secondary | ICD-10-CM | POA: Diagnosis not present

## 2020-06-25 DIAGNOSIS — I10 Essential (primary) hypertension: Secondary | ICD-10-CM | POA: Diagnosis not present

## 2020-06-25 DIAGNOSIS — R262 Difficulty in walking, not elsewhere classified: Secondary | ICD-10-CM | POA: Diagnosis not present

## 2020-06-25 DIAGNOSIS — R0981 Nasal congestion: Secondary | ICD-10-CM | POA: Diagnosis not present

## 2020-06-25 DIAGNOSIS — I4891 Unspecified atrial fibrillation: Secondary | ICD-10-CM | POA: Diagnosis not present

## 2020-06-26 DIAGNOSIS — R0981 Nasal congestion: Secondary | ICD-10-CM | POA: Diagnosis not present

## 2020-06-26 DIAGNOSIS — R488 Other symbolic dysfunctions: Secondary | ICD-10-CM | POA: Diagnosis not present

## 2020-06-26 DIAGNOSIS — R1312 Dysphagia, oropharyngeal phase: Secondary | ICD-10-CM | POA: Diagnosis not present

## 2020-06-26 DIAGNOSIS — I4891 Unspecified atrial fibrillation: Secondary | ICD-10-CM | POA: Diagnosis not present

## 2020-06-26 DIAGNOSIS — R262 Difficulty in walking, not elsewhere classified: Secondary | ICD-10-CM | POA: Diagnosis not present

## 2020-06-26 DIAGNOSIS — I10 Essential (primary) hypertension: Secondary | ICD-10-CM | POA: Diagnosis not present

## 2020-06-26 DIAGNOSIS — I2510 Atherosclerotic heart disease of native coronary artery without angina pectoris: Secondary | ICD-10-CM | POA: Diagnosis not present

## 2020-06-26 DIAGNOSIS — F0150 Vascular dementia without behavioral disturbance: Secondary | ICD-10-CM | POA: Diagnosis not present

## 2020-06-27 DIAGNOSIS — R0981 Nasal congestion: Secondary | ICD-10-CM | POA: Diagnosis not present

## 2020-06-27 DIAGNOSIS — R262 Difficulty in walking, not elsewhere classified: Secondary | ICD-10-CM | POA: Diagnosis not present

## 2020-06-27 DIAGNOSIS — L89154 Pressure ulcer of sacral region, stage 4: Secondary | ICD-10-CM | POA: Diagnosis not present

## 2020-06-27 DIAGNOSIS — I10 Essential (primary) hypertension: Secondary | ICD-10-CM | POA: Diagnosis not present

## 2020-06-27 DIAGNOSIS — R1312 Dysphagia, oropharyngeal phase: Secondary | ICD-10-CM | POA: Diagnosis not present

## 2020-06-27 DIAGNOSIS — I4891 Unspecified atrial fibrillation: Secondary | ICD-10-CM | POA: Diagnosis not present

## 2020-06-27 DIAGNOSIS — I2510 Atherosclerotic heart disease of native coronary artery without angina pectoris: Secondary | ICD-10-CM | POA: Diagnosis not present

## 2020-06-27 DIAGNOSIS — F0150 Vascular dementia without behavioral disturbance: Secondary | ICD-10-CM | POA: Diagnosis not present

## 2020-06-27 DIAGNOSIS — R488 Other symbolic dysfunctions: Secondary | ICD-10-CM | POA: Diagnosis not present

## 2020-06-30 DIAGNOSIS — R488 Other symbolic dysfunctions: Secondary | ICD-10-CM | POA: Diagnosis not present

## 2020-06-30 DIAGNOSIS — F0150 Vascular dementia without behavioral disturbance: Secondary | ICD-10-CM | POA: Diagnosis not present

## 2020-06-30 DIAGNOSIS — R0981 Nasal congestion: Secondary | ICD-10-CM | POA: Diagnosis not present

## 2020-06-30 DIAGNOSIS — I10 Essential (primary) hypertension: Secondary | ICD-10-CM | POA: Diagnosis not present

## 2020-06-30 DIAGNOSIS — R262 Difficulty in walking, not elsewhere classified: Secondary | ICD-10-CM | POA: Diagnosis not present

## 2020-06-30 DIAGNOSIS — I2510 Atherosclerotic heart disease of native coronary artery without angina pectoris: Secondary | ICD-10-CM | POA: Diagnosis not present

## 2020-06-30 DIAGNOSIS — I4891 Unspecified atrial fibrillation: Secondary | ICD-10-CM | POA: Diagnosis not present

## 2020-06-30 DIAGNOSIS — R1312 Dysphagia, oropharyngeal phase: Secondary | ICD-10-CM | POA: Diagnosis not present

## 2020-07-01 DIAGNOSIS — R262 Difficulty in walking, not elsewhere classified: Secondary | ICD-10-CM | POA: Diagnosis not present

## 2020-07-01 DIAGNOSIS — F0150 Vascular dementia without behavioral disturbance: Secondary | ICD-10-CM | POA: Diagnosis not present

## 2020-07-01 DIAGNOSIS — I4891 Unspecified atrial fibrillation: Secondary | ICD-10-CM | POA: Diagnosis not present

## 2020-07-01 DIAGNOSIS — I10 Essential (primary) hypertension: Secondary | ICD-10-CM | POA: Diagnosis not present

## 2020-07-01 DIAGNOSIS — I2510 Atherosclerotic heart disease of native coronary artery without angina pectoris: Secondary | ICD-10-CM | POA: Diagnosis not present

## 2020-07-01 DIAGNOSIS — R1312 Dysphagia, oropharyngeal phase: Secondary | ICD-10-CM | POA: Diagnosis not present

## 2020-07-01 DIAGNOSIS — R0981 Nasal congestion: Secondary | ICD-10-CM | POA: Diagnosis not present

## 2020-07-01 DIAGNOSIS — R488 Other symbolic dysfunctions: Secondary | ICD-10-CM | POA: Diagnosis not present

## 2020-07-02 DIAGNOSIS — R0981 Nasal congestion: Secondary | ICD-10-CM | POA: Diagnosis not present

## 2020-07-02 DIAGNOSIS — I4891 Unspecified atrial fibrillation: Secondary | ICD-10-CM | POA: Diagnosis not present

## 2020-07-02 DIAGNOSIS — L89154 Pressure ulcer of sacral region, stage 4: Secondary | ICD-10-CM | POA: Diagnosis not present

## 2020-07-02 DIAGNOSIS — R262 Difficulty in walking, not elsewhere classified: Secondary | ICD-10-CM | POA: Diagnosis not present

## 2020-07-02 DIAGNOSIS — R1312 Dysphagia, oropharyngeal phase: Secondary | ICD-10-CM | POA: Diagnosis not present

## 2020-07-02 DIAGNOSIS — I10 Essential (primary) hypertension: Secondary | ICD-10-CM | POA: Diagnosis not present

## 2020-07-02 DIAGNOSIS — R488 Other symbolic dysfunctions: Secondary | ICD-10-CM | POA: Diagnosis not present

## 2020-07-02 DIAGNOSIS — I2510 Atherosclerotic heart disease of native coronary artery without angina pectoris: Secondary | ICD-10-CM | POA: Diagnosis not present

## 2020-07-02 DIAGNOSIS — F0150 Vascular dementia without behavioral disturbance: Secondary | ICD-10-CM | POA: Diagnosis not present

## 2020-07-03 DIAGNOSIS — J302 Other seasonal allergic rhinitis: Secondary | ICD-10-CM | POA: Diagnosis not present

## 2020-07-03 DIAGNOSIS — R488 Other symbolic dysfunctions: Secondary | ICD-10-CM | POA: Diagnosis not present

## 2020-07-03 DIAGNOSIS — F0150 Vascular dementia without behavioral disturbance: Secondary | ICD-10-CM | POA: Diagnosis not present

## 2020-07-03 DIAGNOSIS — F0390 Unspecified dementia without behavioral disturbance: Secondary | ICD-10-CM | POA: Diagnosis not present

## 2020-07-03 DIAGNOSIS — R1312 Dysphagia, oropharyngeal phase: Secondary | ICD-10-CM | POA: Diagnosis not present

## 2020-07-03 DIAGNOSIS — R262 Difficulty in walking, not elsewhere classified: Secondary | ICD-10-CM | POA: Diagnosis not present

## 2020-07-03 DIAGNOSIS — I2510 Atherosclerotic heart disease of native coronary artery without angina pectoris: Secondary | ICD-10-CM | POA: Diagnosis not present

## 2020-07-03 DIAGNOSIS — R5381 Other malaise: Secondary | ICD-10-CM | POA: Diagnosis not present

## 2020-07-03 DIAGNOSIS — I10 Essential (primary) hypertension: Secondary | ICD-10-CM | POA: Diagnosis not present

## 2020-07-03 DIAGNOSIS — I4891 Unspecified atrial fibrillation: Secondary | ICD-10-CM | POA: Diagnosis not present

## 2020-07-03 DIAGNOSIS — N390 Urinary tract infection, site not specified: Secondary | ICD-10-CM | POA: Diagnosis not present

## 2020-07-03 DIAGNOSIS — R0981 Nasal congestion: Secondary | ICD-10-CM | POA: Diagnosis not present

## 2020-07-03 DIAGNOSIS — R634 Abnormal weight loss: Secondary | ICD-10-CM | POA: Diagnosis not present

## 2020-07-03 DIAGNOSIS — Z8679 Personal history of other diseases of the circulatory system: Secondary | ICD-10-CM | POA: Diagnosis not present

## 2020-07-04 DIAGNOSIS — I4891 Unspecified atrial fibrillation: Secondary | ICD-10-CM | POA: Diagnosis not present

## 2020-07-04 DIAGNOSIS — F0150 Vascular dementia without behavioral disturbance: Secondary | ICD-10-CM | POA: Diagnosis not present

## 2020-07-04 DIAGNOSIS — I10 Essential (primary) hypertension: Secondary | ICD-10-CM | POA: Diagnosis not present

## 2020-07-04 DIAGNOSIS — I2510 Atherosclerotic heart disease of native coronary artery without angina pectoris: Secondary | ICD-10-CM | POA: Diagnosis not present

## 2020-07-04 DIAGNOSIS — R1312 Dysphagia, oropharyngeal phase: Secondary | ICD-10-CM | POA: Diagnosis not present

## 2020-07-04 DIAGNOSIS — R0981 Nasal congestion: Secondary | ICD-10-CM | POA: Diagnosis not present

## 2020-07-04 DIAGNOSIS — R488 Other symbolic dysfunctions: Secondary | ICD-10-CM | POA: Diagnosis not present

## 2020-07-04 DIAGNOSIS — R262 Difficulty in walking, not elsewhere classified: Secondary | ICD-10-CM | POA: Diagnosis not present

## 2020-07-07 DIAGNOSIS — I4891 Unspecified atrial fibrillation: Secondary | ICD-10-CM | POA: Diagnosis not present

## 2020-07-07 DIAGNOSIS — F0150 Vascular dementia without behavioral disturbance: Secondary | ICD-10-CM | POA: Diagnosis not present

## 2020-07-07 DIAGNOSIS — R488 Other symbolic dysfunctions: Secondary | ICD-10-CM | POA: Diagnosis not present

## 2020-07-07 DIAGNOSIS — I2510 Atherosclerotic heart disease of native coronary artery without angina pectoris: Secondary | ICD-10-CM | POA: Diagnosis not present

## 2020-07-07 DIAGNOSIS — R262 Difficulty in walking, not elsewhere classified: Secondary | ICD-10-CM | POA: Diagnosis not present

## 2020-07-07 DIAGNOSIS — N390 Urinary tract infection, site not specified: Secondary | ICD-10-CM | POA: Diagnosis not present

## 2020-07-07 DIAGNOSIS — R0981 Nasal congestion: Secondary | ICD-10-CM | POA: Diagnosis not present

## 2020-07-07 DIAGNOSIS — L89150 Pressure ulcer of sacral region, unstageable: Secondary | ICD-10-CM | POA: Diagnosis not present

## 2020-07-07 DIAGNOSIS — I10 Essential (primary) hypertension: Secondary | ICD-10-CM | POA: Diagnosis not present

## 2020-07-07 DIAGNOSIS — R1312 Dysphagia, oropharyngeal phase: Secondary | ICD-10-CM | POA: Diagnosis not present

## 2020-07-08 DIAGNOSIS — I10 Essential (primary) hypertension: Secondary | ICD-10-CM | POA: Diagnosis not present

## 2020-07-08 DIAGNOSIS — R1312 Dysphagia, oropharyngeal phase: Secondary | ICD-10-CM | POA: Diagnosis not present

## 2020-07-08 DIAGNOSIS — I4891 Unspecified atrial fibrillation: Secondary | ICD-10-CM | POA: Diagnosis not present

## 2020-07-08 DIAGNOSIS — N390 Urinary tract infection, site not specified: Secondary | ICD-10-CM | POA: Diagnosis not present

## 2020-07-08 DIAGNOSIS — I2510 Atherosclerotic heart disease of native coronary artery without angina pectoris: Secondary | ICD-10-CM | POA: Diagnosis not present

## 2020-07-08 DIAGNOSIS — R488 Other symbolic dysfunctions: Secondary | ICD-10-CM | POA: Diagnosis not present

## 2020-07-08 DIAGNOSIS — R262 Difficulty in walking, not elsewhere classified: Secondary | ICD-10-CM | POA: Diagnosis not present

## 2020-07-08 DIAGNOSIS — F0150 Vascular dementia without behavioral disturbance: Secondary | ICD-10-CM | POA: Diagnosis not present

## 2020-07-08 DIAGNOSIS — R0981 Nasal congestion: Secondary | ICD-10-CM | POA: Diagnosis not present

## 2020-07-09 ENCOUNTER — Non-Acute Institutional Stay: Payer: Medicare HMO | Admitting: Primary Care

## 2020-07-09 ENCOUNTER — Other Ambulatory Visit: Payer: Self-pay

## 2020-07-09 DIAGNOSIS — F0390 Unspecified dementia without behavioral disturbance: Secondary | ICD-10-CM | POA: Diagnosis not present

## 2020-07-09 DIAGNOSIS — I10 Essential (primary) hypertension: Secondary | ICD-10-CM | POA: Diagnosis not present

## 2020-07-09 DIAGNOSIS — Z515 Encounter for palliative care: Secondary | ICD-10-CM | POA: Diagnosis not present

## 2020-07-09 DIAGNOSIS — I2510 Atherosclerotic heart disease of native coronary artery without angina pectoris: Secondary | ICD-10-CM | POA: Diagnosis not present

## 2020-07-09 DIAGNOSIS — R262 Difficulty in walking, not elsewhere classified: Secondary | ICD-10-CM | POA: Diagnosis not present

## 2020-07-09 DIAGNOSIS — F0150 Vascular dementia without behavioral disturbance: Secondary | ICD-10-CM | POA: Diagnosis not present

## 2020-07-09 DIAGNOSIS — I4891 Unspecified atrial fibrillation: Secondary | ICD-10-CM | POA: Diagnosis not present

## 2020-07-09 DIAGNOSIS — F03C0 Unspecified dementia, severe, without behavioral disturbance, psychotic disturbance, mood disturbance, and anxiety: Secondary | ICD-10-CM

## 2020-07-09 DIAGNOSIS — R1312 Dysphagia, oropharyngeal phase: Secondary | ICD-10-CM | POA: Diagnosis not present

## 2020-07-09 DIAGNOSIS — L89154 Pressure ulcer of sacral region, stage 4: Secondary | ICD-10-CM | POA: Diagnosis not present

## 2020-07-09 DIAGNOSIS — R0981 Nasal congestion: Secondary | ICD-10-CM | POA: Diagnosis not present

## 2020-07-09 DIAGNOSIS — R488 Other symbolic dysfunctions: Secondary | ICD-10-CM | POA: Diagnosis not present

## 2020-07-09 NOTE — Progress Notes (Signed)
[facility] Consult Note Telephone: [phone]  Fax: [phone]  PATIENT NAME: Tara Huffman [address] [place] [address] [phone] (home)  DOB: [birth year]... MRN: [id number]  PRIMARY CARE PROVIDER:    Juluis Pitch, MD,  [address] [place] [address] [phone]  REFERRING PROVIDER:   Juluis Pitch, MD [address] [place],  [place] [address] [phone]  RESPONSIBLE PARTY:   Extended Emergency Contact Information Primary Emergency Contact: Carmelina Paddock States of [place] Phone: [phone] Mobile Phone: [phone] Relation: Daughter Secondary Emergency Contact: Moore,Carolyn  [place] Phone: [phone] Relation: Sister  I met face to face with patient in the facility.  ASSESSMENT AND RECOMMENDATIONS:   1. Advance Care Planning/Goals of Care: Goals include to maximize quality of life and symptom management. Advance directives on file. I phoned Mendel Corning, power of attorney for Mrs. Tullis. She did not pick up and I left a message that  I have visited and would be happy to discuss any concerns. Patient is continuing to decline, with the ability and failure to thrive, three large decubitus ulcers and poor PO intake. I would recommend a hospice admission at this time.  2. Symptom Management:   Pain: 50 mg tramadol q 4 hrs, has helped with pain relief. She has pain from ulcers and contractures.  Skin break down: Highly exudating, odor, stage 4/unstageable due to slough. Albumin 2.9 in early 9.21. 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  3. Follow up Palliative Care Visit: Palliative care will continue to follow for goals of care clarification and symptom management. Return 2 weeks or prn.  4. Family /Caregiver/Community Supports: Daughter is Designer, industrial/product, lives in [place].  5. Cognitive / Functional decline:  A and O x 1, dependent in all adls.   I spent 25  minutes providing this consultation,  from 1400 to 1425. More than 50% of the time in this consultation was spent coordinating communication.   CHIEF COMPLAINT: FTT, wounds  HISTORY OF PRESENT ILLNESS:  Tara Huffman is a 79 y.o. year old female with multiple medical problems including dementia, wounds, FTT, debility, h/o alcohol abuse. Palliative Care was asked to follow this patient by consultation request of Juluis Pitch, MD to help address advance care planning and goals of care. This is a follow up visit.  CODE STATUS: DNR  PPS: 30%  HOSPICE ELIGIBILITY/DIAGNOSIS: yes/abnormal weight loss  PAST MEDICAL HISTORY:  Past Medical History:  Diagnosis Date  . Allergy   . Dementia ([place])   . Hyperlipidemia   . Hypertension   . Insomnia   . Insomnia     SOCIAL HX:  Social History   Tobacco Use  . Smoking status: Never Smoker  . Smokeless tobacco: Never Used  Substance Use Topics  . Alcohol use: Yes    Alcohol/week: 14.0 standard drinks    Types: 14 Cans of beer per week   FAMILY HX:  Family History  Problem Relation Age of Onset  . Thyroid disease Sister     ALLERGIES:  Allergies  Allergen Reactions  . Ambien [Zolpidem]     Sleep walking     PERTINENT MEDICATIONS:  Outpatient Encounter Medications as of 07/09/2020  Medication Sig  . Amino Acids-Protein Hydrolys (FEEDING SUPPLEMENT, PRO-STAT SUGAR FREE 64,) LIQD Take 30 mLs by mouth 3 (three) times daily with meals.  Marland Kitchen ascorbic acid (VITAMIN C) 500 MG tablet Take 500 mg by mouth daily.  Marland Kitchen  atenolol (TENORMIN) 50 MG tablet TAKE 1 TABLET (50 MG TOTAL) BY MOUTH DAILY.  . ciclopirox (LOPROX) 0.77 % cream Apply 1 application topically daily. To toenails  . divalproex (DEPAKOTE) 125 MG DR tablet Take 125 mg by mouth 3 (three) times daily.  Marland Kitchen donepezil (ARICEPT) 10 MG tablet TAKE 1 TABLET BY MOUTH EVERY DAY AT NIGHT (Patient taking differently: Take 10 mg by mouth at bedtime. TAKE 1 TABLET BY MOUTH EVERY DAY AT NIGHT)    . fluticasone (FLONASE) 50 MCG/ACT nasal spray SPRAY 2 SPRAY IN EACH NOSTRIL DAILY (Patient taking differently: Place 2 sprays into both nostrils daily. SPRAY 2 SPRAY IN EACH NOSTRIL DAILY)  . LORazepam (ATIVAN) 1 MG tablet Take 1 mg by mouth every 8 (eight) hours as needed for anxiety.  . magnesium hydroxide (MILK OF MAGNESIA) 400 MG/5ML suspension Take 30 mLs by mouth daily.  . melatonin 3 MG TABS tablet Take 3 mg by mouth at bedtime.  . Multiple Vitamin (MULTIVITAMIN) tablet Take 1 tablet by mouth daily.  . polyethylene glycol (MIRALAX / GLYCOLAX) 17 g packet Take 17 g by mouth daily.  . risperiDONE (RISPERDAL) 0.5 MG tablet Take 0.5 mg by mouth at bedtime.  . senna (SENOKOT) 8.6 MG tablet Take 1 tablet by mouth daily.  . traZODone (DESYREL) 150 MG tablet Take 50 mg by mouth at bedtime.   No facility-administered encounter medications on file as of 07/09/2020.    PHYSICAL EXAM / ROS:   Current and past weights: 85.3 lbs, BMI 14.4, 113 in April, 21. This signifies 25% weight loss in 6 months.  General: NAD, frail appearing, cachectic Cardiovascular: no chest pain reported, no edema  Pulmonary: lungs CTA, no cough, no increased SOB, oxygen 2 L / [place] Abdomen: appetite fair, endorses constipation, incontinent of bowel GU: denies dysuria, incontinent of urine MSK:  ++ joint and ROM abnormalities, non ambulatory, contractures of LE Skin: breakdown on sacrum and ischium, unstageable with slough, 5x4 cm, odorous after cleaning Neurological: Weakness, but otherwise nonfocal  Jason Coop, NP , DNP, MPH, [facility]  COVID-19 PATIENT SCREENING TOOL  Person answering questions: ____________staff______   1.  Is the patient or any family member in the home showing any signs or symptoms regarding respiratory infection?               Person with Symptom- __________NA_________________  a. Fever                                                                          Yes___ No___           ___________________  b. Shortness of breath                                                    Yes___ No___          ___________________ c. Cough/congestion  Yes___  No___         ___________________ d. Body aches/pains                                                         Yes___ No___        ____________________ e. Gastrointestinal symptoms (diarrhea, nausea)           Yes___ No___        ____________________  2. Within the past 14 days, has anyone living in the home had any contact with someone with or under investigation for COVID-19?    Yes___ No_X_   Person __________________   

## 2020-07-10 DIAGNOSIS — R1312 Dysphagia, oropharyngeal phase: Secondary | ICD-10-CM | POA: Diagnosis not present

## 2020-07-10 DIAGNOSIS — I4891 Unspecified atrial fibrillation: Secondary | ICD-10-CM | POA: Diagnosis not present

## 2020-07-10 DIAGNOSIS — R0981 Nasal congestion: Secondary | ICD-10-CM | POA: Diagnosis not present

## 2020-07-10 DIAGNOSIS — I2510 Atherosclerotic heart disease of native coronary artery without angina pectoris: Secondary | ICD-10-CM | POA: Diagnosis not present

## 2020-07-10 DIAGNOSIS — I10 Essential (primary) hypertension: Secondary | ICD-10-CM | POA: Diagnosis not present

## 2020-07-10 DIAGNOSIS — R262 Difficulty in walking, not elsewhere classified: Secondary | ICD-10-CM | POA: Diagnosis not present

## 2020-07-10 DIAGNOSIS — R488 Other symbolic dysfunctions: Secondary | ICD-10-CM | POA: Diagnosis not present

## 2020-07-10 DIAGNOSIS — F0150 Vascular dementia without behavioral disturbance: Secondary | ICD-10-CM | POA: Diagnosis not present

## 2020-07-16 DIAGNOSIS — L89154 Pressure ulcer of sacral region, stage 4: Secondary | ICD-10-CM | POA: Diagnosis not present

## 2020-07-18 DIAGNOSIS — L89150 Pressure ulcer of sacral region, unstageable: Secondary | ICD-10-CM | POA: Diagnosis not present

## 2020-07-18 DIAGNOSIS — M4628 Osteomyelitis of vertebra, sacral and sacrococcygeal region: Secondary | ICD-10-CM | POA: Diagnosis not present

## 2020-07-22 DIAGNOSIS — I8312 Varicose veins of left lower extremity with inflammation: Secondary | ICD-10-CM | POA: Diagnosis not present

## 2020-07-22 DIAGNOSIS — R451 Restlessness and agitation: Secondary | ICD-10-CM | POA: Diagnosis not present

## 2020-07-22 DIAGNOSIS — B351 Tinea unguium: Secondary | ICD-10-CM | POA: Diagnosis not present

## 2020-07-22 DIAGNOSIS — I8311 Varicose veins of right lower extremity with inflammation: Secondary | ICD-10-CM | POA: Diagnosis not present

## 2020-07-22 DIAGNOSIS — R296 Repeated falls: Secondary | ICD-10-CM | POA: Diagnosis not present

## 2020-07-22 DIAGNOSIS — M6281 Muscle weakness (generalized): Secondary | ICD-10-CM | POA: Diagnosis not present

## 2020-07-22 DIAGNOSIS — E440 Moderate protein-calorie malnutrition: Secondary | ICD-10-CM | POA: Diagnosis not present

## 2020-07-22 DIAGNOSIS — F0150 Vascular dementia without behavioral disturbance: Secondary | ICD-10-CM | POA: Diagnosis not present

## 2020-07-22 DIAGNOSIS — L603 Nail dystrophy: Secondary | ICD-10-CM | POA: Diagnosis not present

## 2020-07-22 DIAGNOSIS — I739 Peripheral vascular disease, unspecified: Secondary | ICD-10-CM | POA: Diagnosis not present

## 2020-07-22 DIAGNOSIS — F603 Borderline personality disorder: Secondary | ICD-10-CM | POA: Diagnosis not present

## 2020-07-22 DIAGNOSIS — G4701 Insomnia due to medical condition: Secondary | ICD-10-CM | POA: Diagnosis not present

## 2020-07-23 ENCOUNTER — Non-Acute Institutional Stay: Payer: Medicare HMO | Admitting: Primary Care

## 2020-07-23 ENCOUNTER — Other Ambulatory Visit: Payer: Self-pay

## 2020-07-23 DIAGNOSIS — L89154 Pressure ulcer of sacral region, stage 4: Secondary | ICD-10-CM | POA: Diagnosis not present

## 2020-07-23 DIAGNOSIS — F03C0 Unspecified dementia, severe, without behavioral disturbance, psychotic disturbance, mood disturbance, and anxiety: Secondary | ICD-10-CM

## 2020-07-23 DIAGNOSIS — Z515 Encounter for palliative care: Secondary | ICD-10-CM

## 2020-07-23 DIAGNOSIS — F0390 Unspecified dementia without behavioral disturbance: Secondary | ICD-10-CM | POA: Diagnosis not present

## 2020-07-23 NOTE — Progress Notes (Signed)
[facility] Consult Note Telephone: [phone]  Fax: [phone]  PATIENT NAME: Tara Huffman [address] [place] [address] [phone] (home)  DOB: Jun 17, 1941 MRN: [id number]  PRIMARY CARE PROVIDER:    Juluis Pitch, MD,  [address] [place] [address] [phone]  REFERRING PROVIDER:   Juluis Pitch, MD [address] [place],  [place] [address] [phone]  RESPONSIBLE PARTY:   Extended Emergency Contact Information Primary Emergency Contact: Carmelina Paddock [place] of [place] Phone: [phone] Mobile Phone: [phone] Relation: Daughter Secondary Emergency Contact: Moore,Carolyn  [place] Phone: [phone] Relation: Sister  I met face to face with patient  In the  facility.  ASSESSMENT AND RECOMMENDATIONS:   1. Advance Care Planning/Goals of Care: Goals include to maximize quality of life and symptom management. Our advance care planning conversation included a discussion about: Exploration of personal, cultural or spiritual beliefs that might influence medical decisions . Goals of care include treating in place for infection, weight loss. Spoke with daughter Ms Loanne Drilling about patient status, physical and psychiatric. She continues to voice desire for symptom management, improved nutrition.  2. Symptom Management:   Nutrition: Continues to have weight loss, no gain in 5 months. 20% weight loss in 5 lbs.  Recommend liberalizing diet, to quality of life diet,  supplements such as ensure, prostat. Needs to be fed each meal. Recommend increasing of mirtazapine for appetite. Daughter often feeds and brings in outside food. Voices she does not enjoy the puree food. Recent albumin is 2.6, was 2.5 on admission. She has lost 28% of her weight since her SNF admission in Feb. 2021.  Dysnea; Denies, often takes off oxygen. Please discontinue oxygen order.   Pain: Recommend scheduling 50 mg  tramadol bid or tid in addition to offering prn dosing. She is immobile and has chronic wound pain.   Wound: Has osteomyelitis now in wound. Begun on bactrim. Discussed wound status and wound care plan with Dr Phillip Heal, wound specialist. Discussed plan of care with daughter as well, providing education RE wound healing.  3. Follow up Palliative Care Visit: Palliative care will continue to follow for goals of care clarification and symptom management. Return 4 weeks or prn.  4. Family /Caregiver/Community Supports:  Daughter is POA, living in LTC due to debility.  5. Cognitive / Functional decline:  A and O x 1, dependent in all adl and iadls.   I spent 35 minutes providing this consultation,  from 1600 to 1635. More than 50% of the time in this consultation was spent coordinating communication.   CHIEF COMPLAINT: weight loss, debility, wound infection  HISTORY OF PRESENT ILLNESS:  Tara Huffman is a 79 y.o. year old female with multiple medical problems including  Current weight loss (28% since admission), poor intake, wound infection. Palliative Care was asked to follow this patient by consultation request of Juluis Pitch, MD to help address advance care planning and goals of care. This is a follow up visit.  CODE STATUS: DNR, comfort measures, limited abx and iv, no feeding tube.  PPS: 30%  HOSPICE ELIGIBILITY/DIAGNOSIS: tbd  PHYSICAL EXAM / ROS:   Current and past weights: Current weight is 85.2 lbs, in 5/21 104 lbs, 91 lbs 8/25. 118 lbs in Feb. 28% General:  frail appearing, thin Cardiovascular: no chest pain reported, no  LE edema  Pulmonary: no cough, no increased SOB, has oxygen frequently takes it off. Abdomen: appetite poor,  Denies constipation, incontinent of  bowel GU: denies dysuria, incontinent of urine MSK:  + joint and ROM abnormalities, LE contractures, non-ambulatory Skin: decubitus ulcer with osteomyelitis Neurological: Weakness, endorses pain, endorses occ   Insomnia, dementia with behavioral disturbances  CURRENT PROBLEM LIST:  Patient Active Problem List   Diagnosis Date Noted  . Palliative care encounter 04/02/2020  . Elevated troponin   . Fall   . Elevated CK   . MI, acute, non ST segment elevation ([place]) 11/19/2019  . Rhabdomyolysis 11/19/2019  . 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 Atrial fibrillation ([place]) 11/19/2019  . Altered mental status 10/27/2018  . 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 Advanced care planning/counseling discussion 10/27/2018  . 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 Hallucinations 07/29/2015  . Hyperlipidemia   . Hypertension   . Insomnia    PAST MEDICAL HISTORY:  Past Medical History:  Diagnosis Date  . Allergy   . Dementia ([place] [facility] [place])   . Hyperlipidemia   . Hypertension   . Insomnia   . Insomnia     SOCIAL HX:  Social History   Tobacco Use  . Smoking status: Never Smoker  . Smokeless tobacco: Never Used  Substance Use Topics  . Alcohol use: Yes    Alcohol/week: 14.0 standard drinks    Types: 14 Cans of beer per week   FAMILY HX:  Family History  Problem Relation Age of Onset  . Thyroid disease Sister     ALLERGIES:  Allergies  Allergen Reactions  . Ambien [Zolpidem]     Sleep walking     PERTINENT MEDICATIONS:  Outpatient Encounter Medications as of 07/23/2020  Medication Sig  . Amino Acids-Protein Hydrolys (FEEDING SUPPLEMENT, PRO-STAT SUGAR FREE 64,) LIQD Take 30 mLs by mouth 3 (three) times daily with meals.  Marland Kitchen ascorbic acid (VITAMIN C) 500 MG tablet Take 500 mg by mouth daily.  Marland Kitchen atenolol (TENORMIN) 50 MG tablet TAKE 1 TABLET (50 MG TOTAL) BY MOUTH DAILY.  . ciclopirox (LOPROX) 0.77 % cream Apply 1 application topically daily. To toenails  . fluticasone (FLONASE) 50 MCG/ACT nasal spray SPRAY 2 SPRAY IN EACH NOSTRIL DAILY (Patient taking differently: Place 2 sprays into both nostrils daily. SPRAY 2 SPRAY IN EACH NOSTRIL DAILY)  . melatonin 3 MG TABS tablet Take 3 mg by mouth at  bedtime.  . mirtazapine (REMERON) 7.5 MG tablet Take 7.5 mg by mouth at bedtime.  . Multiple Vitamin (MULTIVITAMIN) tablet Take 1 tablet by mouth daily.  . polyethylene glycol (MIRALAX / GLYCOLAX) 17 g packet Take 17 g by mouth daily.  Marland Kitchen senna (SENOKOT) 8.6 MG tablet Take 1 tablet by mouth daily.  Marland Kitchen sulfamethoxazole-trimethoprim (BACTRIM DS) 800-160 MG tablet Take 1 tablet by mouth 2 (two) times daily.  . traMADol (ULTRAM) 50 MG tablet Take 50 mg by mouth every 4 (four) hours as needed for moderate pain.  . traZODone (DESYREL) 150 MG tablet Take 50 mg by mouth at bedtime.  . [DISCONTINUED] divalproex (DEPAKOTE) 125 MG DR tablet Take 125 mg by mouth 3 (three) times daily.  . [DISCONTINUED] risperiDONE (RISPERDAL) 0.5 MG tablet Take 0.5 mg by mouth at bedtime.  . [DISCONTINUED] donepezil (ARICEPT) 10 MG tablet TAKE 1 TABLET BY MOUTH EVERY DAY AT NIGHT (Patient taking differently: Take 10 mg by mouth at bedtime. TAKE 1 TABLET BY MOUTH EVERY DAY AT NIGHT)  . [DISCONTINUED] LORazepam (ATIVAN) 1 MG tablet Take 1 mg by mouth every 8 (eight) hours as needed for anxiety.  . [DISCONTINUED] magnesium hydroxide (MILK OF MAGNESIA) 400 MG/5ML  suspension Take 30 mLs by mouth daily.   No facility-administered encounter medications on file as of 07/23/2020.      Jason Coop, NP , DNP, MPH, [facility]  COVID-19 PATIENT SCREENING TOOL  Person answering questions: ____________staff______ _____   1.  Is the patient or any family member in the home showing any signs or symptoms regarding respiratory infection?               Person with Symptom- __________NA_________________  a. Fever                                                                          Yes___ No___          ___________________  b. Shortness of breath                                                    Yes___ No___          ___________________ c. Cough/congestion                                       Yes___  No___          ___________________ d. Body aches/pains                                                         Yes___ No___        ____________________ e. Gastrointestinal symptoms (diarrhea, nausea)           Yes___ No___        ____________________  2. Within the past 14 days, has anyone living in the home had any contact with someone with or under investigation for COVID-19?    Yes___ No_X_   Person __________________

## 2020-07-30 DIAGNOSIS — L89154 Pressure ulcer of sacral region, stage 4: Secondary | ICD-10-CM | POA: Diagnosis not present

## 2020-08-03 IMAGING — CT CT HEAD W/O CM
3 series · 15 of 47 positions shown, 18 images · non-contrast
Comparison: None.

CLINICAL DATA: Recent fall

EXAM:
CT HEAD WITHOUT CONTRAST
CT CERVICAL SPINE WITHOUT CONTRAST
TECHNIQUE: Multidetector CT imaging of the head and cervical spine was
performed following the standard protocol without intravenous
contrast. Multiplanar CT image reconstructions of the cervical spine
were also generated.

[Series 2: head wo · axial · 0.39mm/px · z∈[-149,-24]mm · 9 of 30 slices shown, 12 images]
[im 3/30  brain]
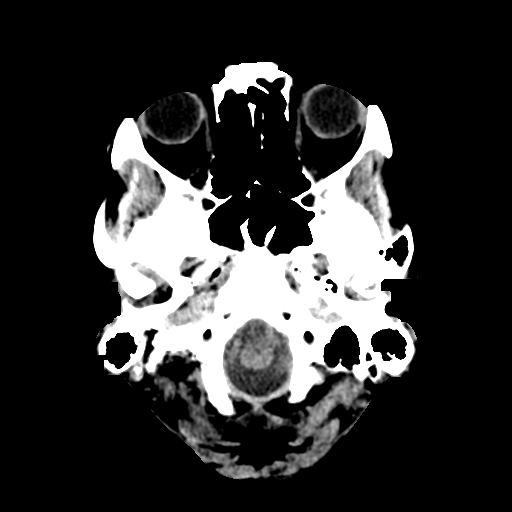
[im 3/30  bone]
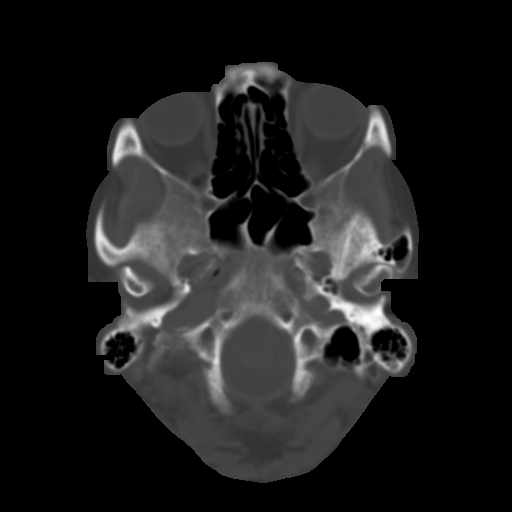
[im 6/30  brain]
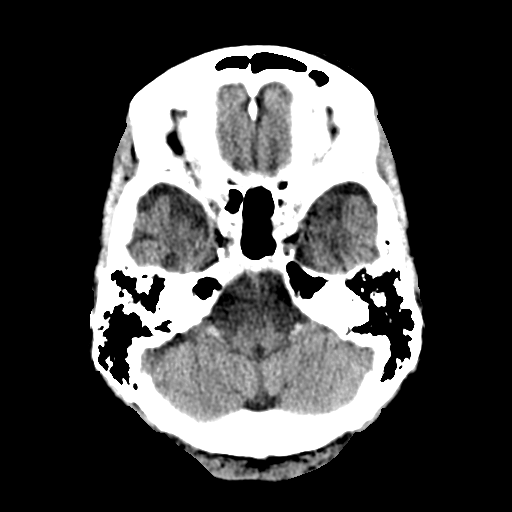
[im 9/30  brain]
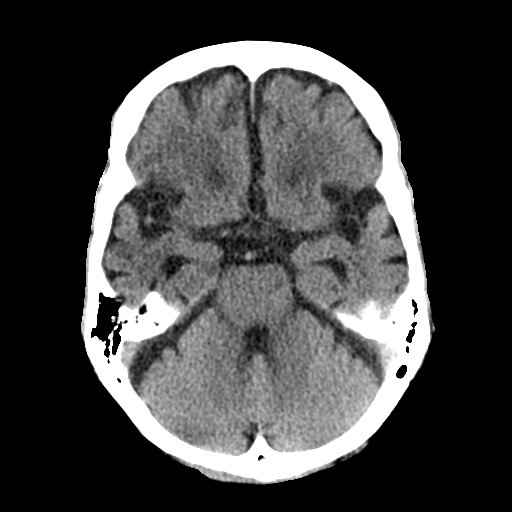
[im 12/30  brain]
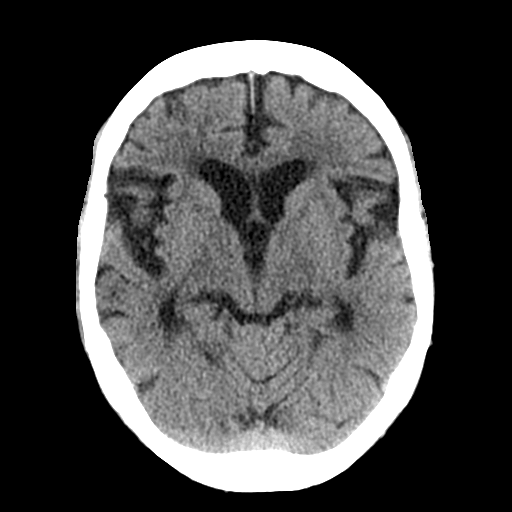
[im 16/30  brain]
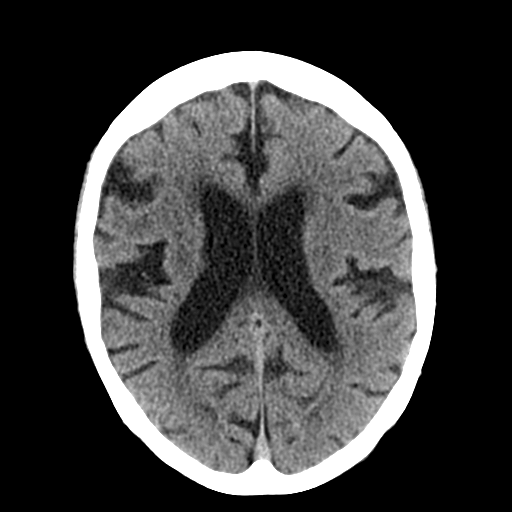
[im 16/30  bone]
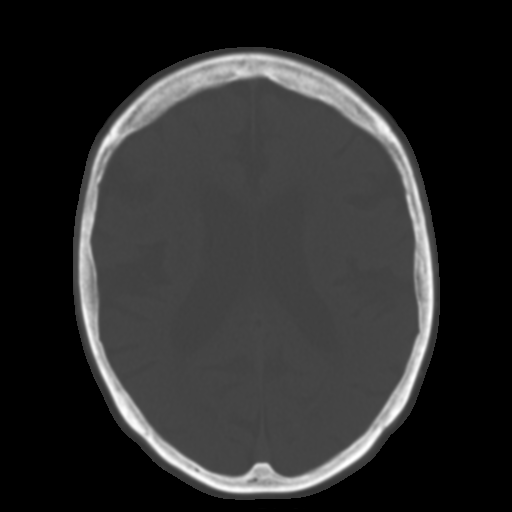
[im 19/30  brain]
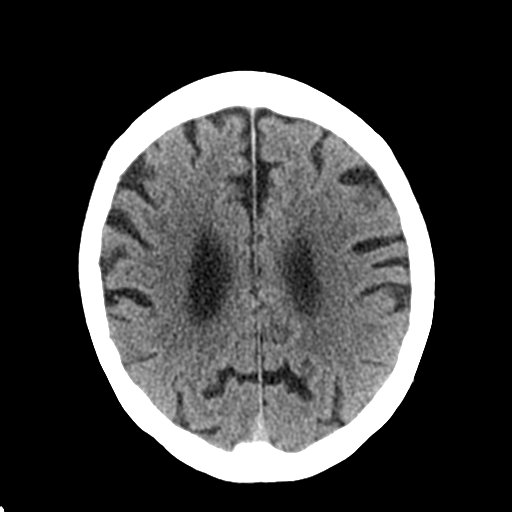
[im 22/30  brain]
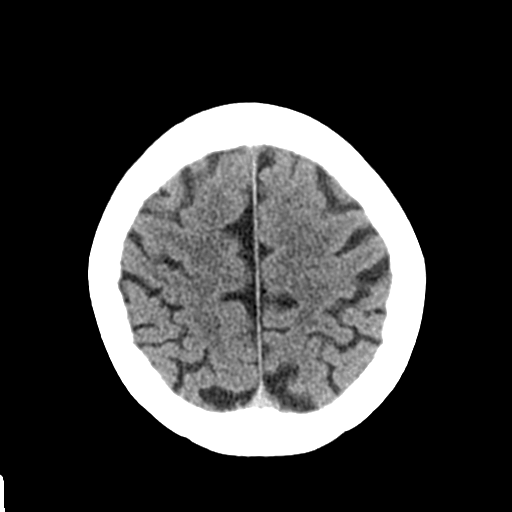
[im 25/30  brain]
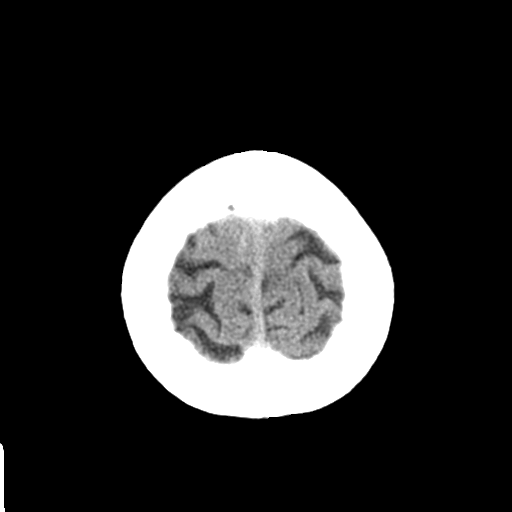
[im 28/30  brain]
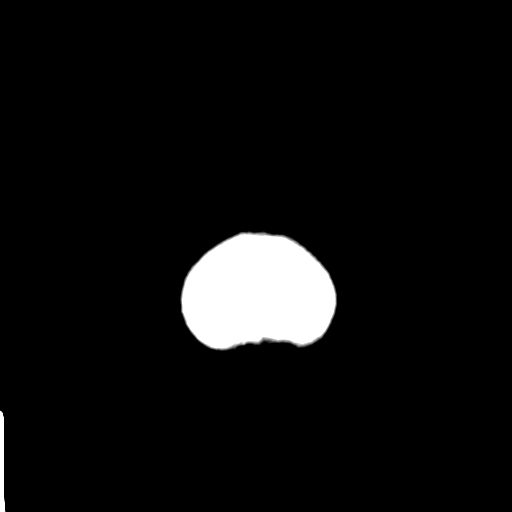
[im 28/30  bone]
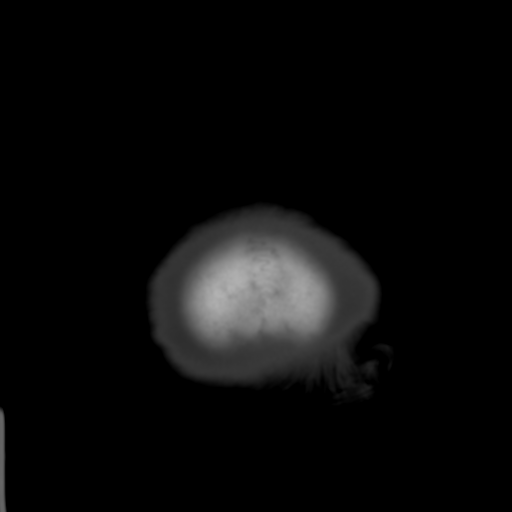

[Series 4: coronal soft tissue · coronal · 0.31mm/px · 3 of 58 slices shown]
[im 20/58  brain]
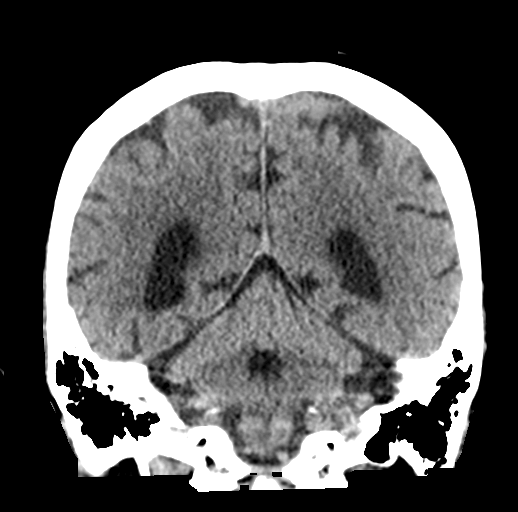
[im 26/58  brain]
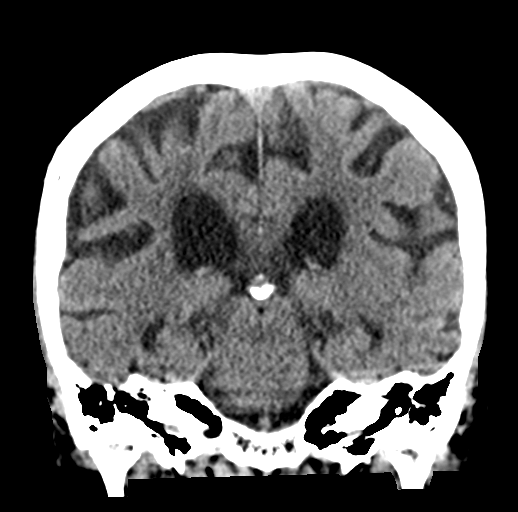
[im 32/58  brain]
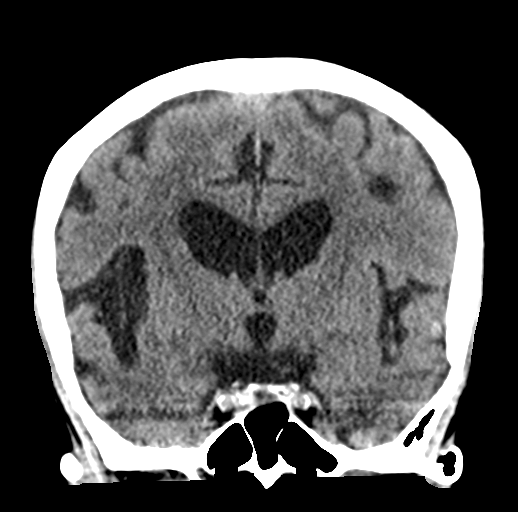

[Series 5: sagittal soft tissue · sagittal · 0.30mm/px · 3 of 51 slices shown]
[im 17/51  brain]
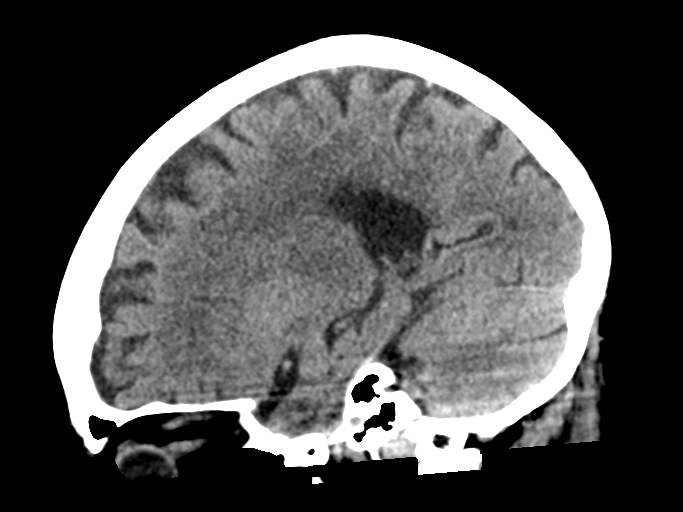
[im 26/51  brain]
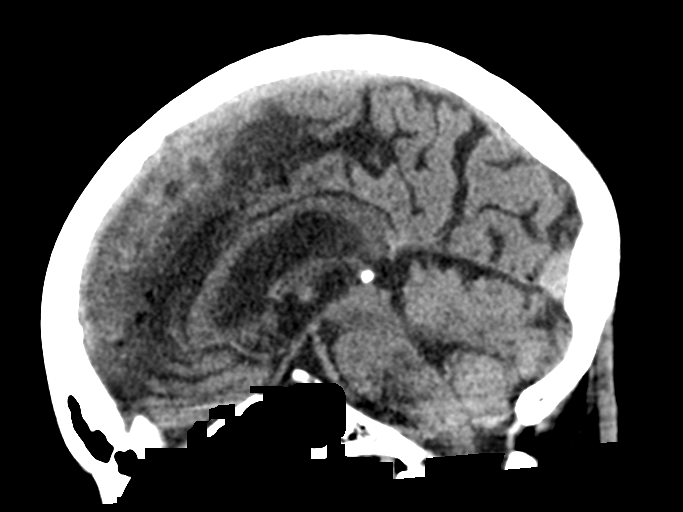
[im 34/51  brain]
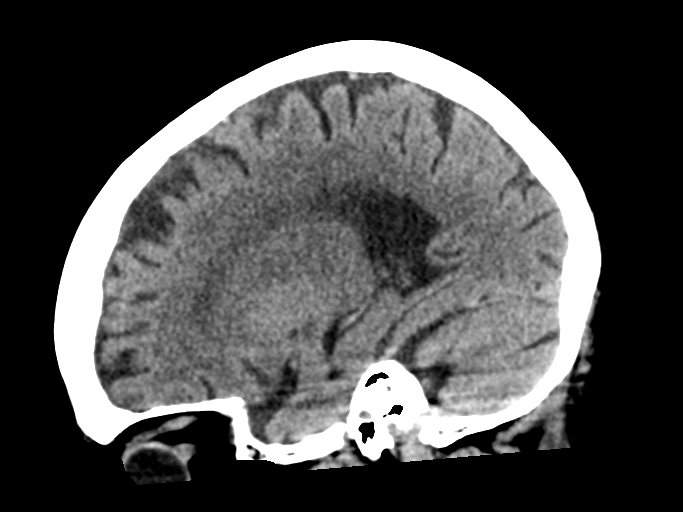

[15 of 47 positions shown; findings below may reference images not displayed]

FINDINGS: CT HEAD FINDINGS

Brain: Chronic atrophic changes and chronic white matter ischemic
changes are noted. No findings to suggest acute hemorrhage, acute
infarction or space-occupying mass lesion are noted.

Vascular: No hyperdense vessel or unexpected calcification.

Skull: Normal. Negative for fracture or focal lesion.

Sinuses/Orbits: No acute finding.

Other: None.

CT CERVICAL SPINE FINDINGS

Alignment: Within normal limits.

Skull base and vertebrae: 7 cervical segments are well visualized.
Vertebral body height is well maintained. Multilevel facet
hypertrophic changes are seen. Mild disc space narrowing is noted
primarily at C5-6 with vacuum disc phenomena. No acute fracture or
acute facet abnormality is noted. The odontoid is within normal
limits.

Soft tissues and spinal canal: Surrounding soft tissue structures
show no acute abnormality.

Upper chest: Visualized lung apices are within normal limits.

Other: None
IMPRESSION: CT of the head: Chronic atrophic and ischemic changes without acute
abnormality.

CT of the cervical spine: Multilevel degenerative change without
acute abnormality.

## 2020-08-06 DIAGNOSIS — L89154 Pressure ulcer of sacral region, stage 4: Secondary | ICD-10-CM | POA: Diagnosis not present

## 2020-08-07 DIAGNOSIS — M62461 Contracture of muscle, right lower leg: Secondary | ICD-10-CM | POA: Diagnosis not present

## 2020-08-07 DIAGNOSIS — R1312 Dysphagia, oropharyngeal phase: Secondary | ICD-10-CM | POA: Diagnosis not present

## 2020-08-07 DIAGNOSIS — M62462 Contracture of muscle, left lower leg: Secondary | ICD-10-CM | POA: Diagnosis not present

## 2020-08-12 DIAGNOSIS — M62462 Contracture of muscle, left lower leg: Secondary | ICD-10-CM | POA: Diagnosis not present

## 2020-08-12 DIAGNOSIS — G4701 Insomnia due to medical condition: Secondary | ICD-10-CM | POA: Diagnosis not present

## 2020-08-12 DIAGNOSIS — R451 Restlessness and agitation: Secondary | ICD-10-CM | POA: Diagnosis not present

## 2020-08-12 DIAGNOSIS — F603 Borderline personality disorder: Secondary | ICD-10-CM | POA: Diagnosis not present

## 2020-08-12 DIAGNOSIS — M62461 Contracture of muscle, right lower leg: Secondary | ICD-10-CM | POA: Diagnosis not present

## 2020-08-12 DIAGNOSIS — F0150 Vascular dementia without behavioral disturbance: Secondary | ICD-10-CM | POA: Diagnosis not present

## 2020-08-12 DIAGNOSIS — R1312 Dysphagia, oropharyngeal phase: Secondary | ICD-10-CM | POA: Diagnosis not present

## 2020-08-13 DIAGNOSIS — M62462 Contracture of muscle, left lower leg: Secondary | ICD-10-CM | POA: Diagnosis not present

## 2020-08-13 DIAGNOSIS — L89154 Pressure ulcer of sacral region, stage 4: Secondary | ICD-10-CM | POA: Diagnosis not present

## 2020-08-13 DIAGNOSIS — R1312 Dysphagia, oropharyngeal phase: Secondary | ICD-10-CM | POA: Diagnosis not present

## 2020-08-13 DIAGNOSIS — M62461 Contracture of muscle, right lower leg: Secondary | ICD-10-CM | POA: Diagnosis not present

## 2020-08-14 DIAGNOSIS — M62461 Contracture of muscle, right lower leg: Secondary | ICD-10-CM | POA: Diagnosis not present

## 2020-08-14 DIAGNOSIS — M62462 Contracture of muscle, left lower leg: Secondary | ICD-10-CM | POA: Diagnosis not present

## 2020-08-14 DIAGNOSIS — R1312 Dysphagia, oropharyngeal phase: Secondary | ICD-10-CM | POA: Diagnosis not present

## 2020-08-15 DIAGNOSIS — R1312 Dysphagia, oropharyngeal phase: Secondary | ICD-10-CM | POA: Diagnosis not present

## 2020-08-15 DIAGNOSIS — M62461 Contracture of muscle, right lower leg: Secondary | ICD-10-CM | POA: Diagnosis not present

## 2020-08-15 DIAGNOSIS — M62462 Contracture of muscle, left lower leg: Secondary | ICD-10-CM | POA: Diagnosis not present

## 2020-08-16 DIAGNOSIS — M62461 Contracture of muscle, right lower leg: Secondary | ICD-10-CM | POA: Diagnosis not present

## 2020-08-16 DIAGNOSIS — R1312 Dysphagia, oropharyngeal phase: Secondary | ICD-10-CM | POA: Diagnosis not present

## 2020-08-16 DIAGNOSIS — M62462 Contracture of muscle, left lower leg: Secondary | ICD-10-CM | POA: Diagnosis not present

## 2020-08-19 DIAGNOSIS — L89150 Pressure ulcer of sacral region, unstageable: Secondary | ICD-10-CM | POA: Diagnosis not present

## 2020-08-19 DIAGNOSIS — I2510 Atherosclerotic heart disease of native coronary artery without angina pectoris: Secondary | ICD-10-CM | POA: Diagnosis not present

## 2020-08-19 DIAGNOSIS — M62462 Contracture of muscle, left lower leg: Secondary | ICD-10-CM | POA: Diagnosis not present

## 2020-08-19 DIAGNOSIS — F0150 Vascular dementia without behavioral disturbance: Secondary | ICD-10-CM | POA: Diagnosis not present

## 2020-08-19 DIAGNOSIS — M62461 Contracture of muscle, right lower leg: Secondary | ICD-10-CM | POA: Diagnosis not present

## 2020-08-19 DIAGNOSIS — I10 Essential (primary) hypertension: Secondary | ICD-10-CM | POA: Diagnosis not present

## 2020-08-19 DIAGNOSIS — F0391 Unspecified dementia with behavioral disturbance: Secondary | ICD-10-CM | POA: Diagnosis not present

## 2020-08-19 DIAGNOSIS — R0981 Nasal congestion: Secondary | ICD-10-CM | POA: Diagnosis not present

## 2020-08-19 DIAGNOSIS — I4891 Unspecified atrial fibrillation: Secondary | ICD-10-CM | POA: Diagnosis not present

## 2020-08-19 DIAGNOSIS — R1312 Dysphagia, oropharyngeal phase: Secondary | ICD-10-CM | POA: Diagnosis not present

## 2020-08-20 DIAGNOSIS — M62461 Contracture of muscle, right lower leg: Secondary | ICD-10-CM | POA: Diagnosis not present

## 2020-08-20 DIAGNOSIS — I4891 Unspecified atrial fibrillation: Secondary | ICD-10-CM | POA: Diagnosis not present

## 2020-08-20 DIAGNOSIS — M62462 Contracture of muscle, left lower leg: Secondary | ICD-10-CM | POA: Diagnosis not present

## 2020-08-20 DIAGNOSIS — R1312 Dysphagia, oropharyngeal phase: Secondary | ICD-10-CM | POA: Diagnosis not present

## 2020-08-20 DIAGNOSIS — I2510 Atherosclerotic heart disease of native coronary artery without angina pectoris: Secondary | ICD-10-CM | POA: Diagnosis not present

## 2020-08-20 DIAGNOSIS — I10 Essential (primary) hypertension: Secondary | ICD-10-CM | POA: Diagnosis not present

## 2020-08-20 DIAGNOSIS — R0981 Nasal congestion: Secondary | ICD-10-CM | POA: Diagnosis not present

## 2020-08-20 DIAGNOSIS — L89154 Pressure ulcer of sacral region, stage 4: Secondary | ICD-10-CM | POA: Diagnosis not present

## 2020-08-20 DIAGNOSIS — F0391 Unspecified dementia with behavioral disturbance: Secondary | ICD-10-CM | POA: Diagnosis not present

## 2020-08-20 DIAGNOSIS — F0150 Vascular dementia without behavioral disturbance: Secondary | ICD-10-CM | POA: Diagnosis not present

## 2020-08-21 DIAGNOSIS — M62462 Contracture of muscle, left lower leg: Secondary | ICD-10-CM | POA: Diagnosis not present

## 2020-08-21 DIAGNOSIS — I4891 Unspecified atrial fibrillation: Secondary | ICD-10-CM | POA: Diagnosis not present

## 2020-08-21 DIAGNOSIS — R0981 Nasal congestion: Secondary | ICD-10-CM | POA: Diagnosis not present

## 2020-08-21 DIAGNOSIS — F0391 Unspecified dementia with behavioral disturbance: Secondary | ICD-10-CM | POA: Diagnosis not present

## 2020-08-21 DIAGNOSIS — I2510 Atherosclerotic heart disease of native coronary artery without angina pectoris: Secondary | ICD-10-CM | POA: Diagnosis not present

## 2020-08-21 DIAGNOSIS — M62461 Contracture of muscle, right lower leg: Secondary | ICD-10-CM | POA: Diagnosis not present

## 2020-08-21 DIAGNOSIS — I10 Essential (primary) hypertension: Secondary | ICD-10-CM | POA: Diagnosis not present

## 2020-08-21 DIAGNOSIS — F0150 Vascular dementia without behavioral disturbance: Secondary | ICD-10-CM | POA: Diagnosis not present

## 2020-08-21 DIAGNOSIS — R1312 Dysphagia, oropharyngeal phase: Secondary | ICD-10-CM | POA: Diagnosis not present

## 2020-08-22 DIAGNOSIS — F0150 Vascular dementia without behavioral disturbance: Secondary | ICD-10-CM | POA: Diagnosis not present

## 2020-08-22 DIAGNOSIS — R0981 Nasal congestion: Secondary | ICD-10-CM | POA: Diagnosis not present

## 2020-08-22 DIAGNOSIS — F0391 Unspecified dementia with behavioral disturbance: Secondary | ICD-10-CM | POA: Diagnosis not present

## 2020-08-22 DIAGNOSIS — M62462 Contracture of muscle, left lower leg: Secondary | ICD-10-CM | POA: Diagnosis not present

## 2020-08-22 DIAGNOSIS — I4891 Unspecified atrial fibrillation: Secondary | ICD-10-CM | POA: Diagnosis not present

## 2020-08-22 DIAGNOSIS — I2510 Atherosclerotic heart disease of native coronary artery without angina pectoris: Secondary | ICD-10-CM | POA: Diagnosis not present

## 2020-08-22 DIAGNOSIS — I10 Essential (primary) hypertension: Secondary | ICD-10-CM | POA: Diagnosis not present

## 2020-08-22 DIAGNOSIS — M62461 Contracture of muscle, right lower leg: Secondary | ICD-10-CM | POA: Diagnosis not present

## 2020-08-22 DIAGNOSIS — R1312 Dysphagia, oropharyngeal phase: Secondary | ICD-10-CM | POA: Diagnosis not present

## 2020-08-23 DIAGNOSIS — N390 Urinary tract infection, site not specified: Secondary | ICD-10-CM | POA: Diagnosis not present

## 2020-08-25 DIAGNOSIS — F0150 Vascular dementia without behavioral disturbance: Secondary | ICD-10-CM | POA: Diagnosis not present

## 2020-08-25 DIAGNOSIS — F0391 Unspecified dementia with behavioral disturbance: Secondary | ICD-10-CM | POA: Diagnosis not present

## 2020-08-25 DIAGNOSIS — R1312 Dysphagia, oropharyngeal phase: Secondary | ICD-10-CM | POA: Diagnosis not present

## 2020-08-25 DIAGNOSIS — I4891 Unspecified atrial fibrillation: Secondary | ICD-10-CM | POA: Diagnosis not present

## 2020-08-25 DIAGNOSIS — I10 Essential (primary) hypertension: Secondary | ICD-10-CM | POA: Diagnosis not present

## 2020-08-25 DIAGNOSIS — R0981 Nasal congestion: Secondary | ICD-10-CM | POA: Diagnosis not present

## 2020-08-25 DIAGNOSIS — I2510 Atherosclerotic heart disease of native coronary artery without angina pectoris: Secondary | ICD-10-CM | POA: Diagnosis not present

## 2020-08-25 DIAGNOSIS — M62461 Contracture of muscle, right lower leg: Secondary | ICD-10-CM | POA: Diagnosis not present

## 2020-08-25 DIAGNOSIS — M62462 Contracture of muscle, left lower leg: Secondary | ICD-10-CM | POA: Diagnosis not present

## 2020-08-26 DIAGNOSIS — R0981 Nasal congestion: Secondary | ICD-10-CM | POA: Diagnosis not present

## 2020-08-26 DIAGNOSIS — I4891 Unspecified atrial fibrillation: Secondary | ICD-10-CM | POA: Diagnosis not present

## 2020-08-26 DIAGNOSIS — M62462 Contracture of muscle, left lower leg: Secondary | ICD-10-CM | POA: Diagnosis not present

## 2020-08-26 DIAGNOSIS — F0391 Unspecified dementia with behavioral disturbance: Secondary | ICD-10-CM | POA: Diagnosis not present

## 2020-08-26 DIAGNOSIS — I10 Essential (primary) hypertension: Secondary | ICD-10-CM | POA: Diagnosis not present

## 2020-08-26 DIAGNOSIS — R1312 Dysphagia, oropharyngeal phase: Secondary | ICD-10-CM | POA: Diagnosis not present

## 2020-08-26 DIAGNOSIS — I2510 Atherosclerotic heart disease of native coronary artery without angina pectoris: Secondary | ICD-10-CM | POA: Diagnosis not present

## 2020-08-26 DIAGNOSIS — M62461 Contracture of muscle, right lower leg: Secondary | ICD-10-CM | POA: Diagnosis not present

## 2020-08-26 DIAGNOSIS — F0150 Vascular dementia without behavioral disturbance: Secondary | ICD-10-CM | POA: Diagnosis not present

## 2020-08-27 DIAGNOSIS — R0981 Nasal congestion: Secondary | ICD-10-CM | POA: Diagnosis not present

## 2020-08-27 DIAGNOSIS — I10 Essential (primary) hypertension: Secondary | ICD-10-CM | POA: Diagnosis not present

## 2020-08-27 DIAGNOSIS — M62462 Contracture of muscle, left lower leg: Secondary | ICD-10-CM | POA: Diagnosis not present

## 2020-08-27 DIAGNOSIS — I2510 Atherosclerotic heart disease of native coronary artery without angina pectoris: Secondary | ICD-10-CM | POA: Diagnosis not present

## 2020-08-27 DIAGNOSIS — F0150 Vascular dementia without behavioral disturbance: Secondary | ICD-10-CM | POA: Diagnosis not present

## 2020-08-27 DIAGNOSIS — F0391 Unspecified dementia with behavioral disturbance: Secondary | ICD-10-CM | POA: Diagnosis not present

## 2020-08-27 DIAGNOSIS — R1312 Dysphagia, oropharyngeal phase: Secondary | ICD-10-CM | POA: Diagnosis not present

## 2020-08-27 DIAGNOSIS — I4891 Unspecified atrial fibrillation: Secondary | ICD-10-CM | POA: Diagnosis not present

## 2020-08-27 DIAGNOSIS — L89154 Pressure ulcer of sacral region, stage 4: Secondary | ICD-10-CM | POA: Diagnosis not present

## 2020-08-27 DIAGNOSIS — M62461 Contracture of muscle, right lower leg: Secondary | ICD-10-CM | POA: Diagnosis not present

## 2020-08-28 DIAGNOSIS — F0150 Vascular dementia without behavioral disturbance: Secondary | ICD-10-CM | POA: Diagnosis not present

## 2020-08-28 DIAGNOSIS — I4891 Unspecified atrial fibrillation: Secondary | ICD-10-CM | POA: Diagnosis not present

## 2020-08-28 DIAGNOSIS — M62462 Contracture of muscle, left lower leg: Secondary | ICD-10-CM | POA: Diagnosis not present

## 2020-08-28 DIAGNOSIS — I2510 Atherosclerotic heart disease of native coronary artery without angina pectoris: Secondary | ICD-10-CM | POA: Diagnosis not present

## 2020-08-28 DIAGNOSIS — F0391 Unspecified dementia with behavioral disturbance: Secondary | ICD-10-CM | POA: Diagnosis not present

## 2020-08-28 DIAGNOSIS — I10 Essential (primary) hypertension: Secondary | ICD-10-CM | POA: Diagnosis not present

## 2020-08-28 DIAGNOSIS — M62461 Contracture of muscle, right lower leg: Secondary | ICD-10-CM | POA: Diagnosis not present

## 2020-08-28 DIAGNOSIS — R0981 Nasal congestion: Secondary | ICD-10-CM | POA: Diagnosis not present

## 2020-08-28 DIAGNOSIS — R1312 Dysphagia, oropharyngeal phase: Secondary | ICD-10-CM | POA: Diagnosis not present

## 2020-08-29 DIAGNOSIS — I2510 Atherosclerotic heart disease of native coronary artery without angina pectoris: Secondary | ICD-10-CM | POA: Diagnosis not present

## 2020-08-29 DIAGNOSIS — F0150 Vascular dementia without behavioral disturbance: Secondary | ICD-10-CM | POA: Diagnosis not present

## 2020-08-29 DIAGNOSIS — M62462 Contracture of muscle, left lower leg: Secondary | ICD-10-CM | POA: Diagnosis not present

## 2020-08-29 DIAGNOSIS — R1312 Dysphagia, oropharyngeal phase: Secondary | ICD-10-CM | POA: Diagnosis not present

## 2020-08-29 DIAGNOSIS — I10 Essential (primary) hypertension: Secondary | ICD-10-CM | POA: Diagnosis not present

## 2020-08-29 DIAGNOSIS — M62461 Contracture of muscle, right lower leg: Secondary | ICD-10-CM | POA: Diagnosis not present

## 2020-08-29 DIAGNOSIS — F0391 Unspecified dementia with behavioral disturbance: Secondary | ICD-10-CM | POA: Diagnosis not present

## 2020-08-29 DIAGNOSIS — I4891 Unspecified atrial fibrillation: Secondary | ICD-10-CM | POA: Diagnosis not present

## 2020-08-29 DIAGNOSIS — R0981 Nasal congestion: Secondary | ICD-10-CM | POA: Diagnosis not present

## 2020-09-01 ENCOUNTER — Other Ambulatory Visit: Payer: Self-pay

## 2020-09-01 ENCOUNTER — Non-Acute Institutional Stay: Payer: Medicare HMO | Admitting: Primary Care

## 2020-09-01 DIAGNOSIS — M62461 Contracture of muscle, right lower leg: Secondary | ICD-10-CM | POA: Diagnosis not present

## 2020-09-01 DIAGNOSIS — F0390 Unspecified dementia without behavioral disturbance: Secondary | ICD-10-CM

## 2020-09-01 DIAGNOSIS — I10 Essential (primary) hypertension: Secondary | ICD-10-CM | POA: Diagnosis not present

## 2020-09-01 DIAGNOSIS — M62462 Contracture of muscle, left lower leg: Secondary | ICD-10-CM | POA: Diagnosis not present

## 2020-09-01 DIAGNOSIS — I2510 Atherosclerotic heart disease of native coronary artery without angina pectoris: Secondary | ICD-10-CM | POA: Diagnosis not present

## 2020-09-01 DIAGNOSIS — R1312 Dysphagia, oropharyngeal phase: Secondary | ICD-10-CM | POA: Diagnosis not present

## 2020-09-01 DIAGNOSIS — F0391 Unspecified dementia with behavioral disturbance: Secondary | ICD-10-CM | POA: Diagnosis not present

## 2020-09-01 DIAGNOSIS — I4891 Unspecified atrial fibrillation: Secondary | ICD-10-CM | POA: Diagnosis not present

## 2020-09-01 DIAGNOSIS — Z515 Encounter for palliative care: Secondary | ICD-10-CM

## 2020-09-01 DIAGNOSIS — R0981 Nasal congestion: Secondary | ICD-10-CM | POA: Diagnosis not present

## 2020-09-01 DIAGNOSIS — F03C0 Unspecified dementia, severe, without behavioral disturbance, psychotic disturbance, mood disturbance, and anxiety: Secondary | ICD-10-CM

## 2020-09-01 DIAGNOSIS — F0150 Vascular dementia without behavioral disturbance: Secondary | ICD-10-CM | POA: Diagnosis not present

## 2020-09-01 NOTE — Progress Notes (Signed)
[facility] Consult Note Telephone: [phone]  Fax: [phone]     Date of encounter: 09/01/20 PATIENT NAME: Tara Huffman [address] [place] [address] [phone] (home)  DOB: [birth year] MRN: [id number]  PRIMARY CARE PROVIDER:    Rica Koyanagi, MD,  [place] [address] [phone]  REFERRING PROVIDER:   Rica Koyanagi, [address] [place],  [place] [address] [phone]  RESPONSIBLE PARTY:   Extended Emergency Contact Information Primary Emergency Contact: Carmelina Paddock [place] of [place] Phone: [phone] Mobile Phone: [phone] Relation: Daughter Secondary Emergency Contact: Moore,Carolyn  [place] Phone: [phone] Relation: Sister  I met face to face with patient in the facility. Palliative Care was asked to follow this patient by consultation request of Rica Koyanagi, MD to help address advance care planning and goals of care. This is a follow up  visit.   ASSESSMENT AND RECOMMENDATIONS:   1. Advance Care Planning/Goals of Care: Goals include to maximize quality of life and symptom management.   2. Symptom Management:   I met with Tara Huffman in her nursing home room. She was having her lunch. She was able to eat some of the food with her fork. She endorsed liking some of it. Staff states she is able to feed herself now, prior  she was too drowsy to feed her self. In the meantime some of her medicines have been adjusted and she's more alert. Staff endorses occasionally she will scream out, cursing people  and agitated.  This is usually baseline behavior. Staff states they try to redirect and this works most times. However on my past few visits she appears calm and content. She will interact although discussion is often nonsensical. Staff do not have  any new concerns.    3. Follow up Palliative Care Visit: Palliative care will continue to  follow for goals of care clarification and symptom management. Return 4-6 weeks or prn.  4. Family /Caregiver/Community Supports: Daughter Olin Hauser is POA, lives in [place].  5. Cognitive / Functional decline: A and O x 1, dependent in all adls, and iadls.  I spent 15 minutes providing this consultation,  from 1315 to 1330. More than 50% of the time in this consultation was spent coordinating communication.   CODE STATUS:DNR  PPS: 30%  HOSPICE ELIGIBILITY/DIAGNOSIS: TBD  Subjective:  CHIEF COMPLAINT: dementia and agitation  HISTORY OF PRESENT ILLNESS:  Tara Huffman is a 79 y.o. year old female  with dementia, alcoholism, dementia, malnutrition .   We are asked to consult around managing symptoms and advance care planning.    History obtained from review of EMR, discussion with primary team, and  interview with family, caregiver  and/or Tara Huffman. Records reviewed and summarized above.    CURRENT PROBLEM LIST:  Patient Active Problem List   Diagnosis Date Noted  . Palliative care encounter 04/02/2020  . Elevated troponin   . Fall   . Elevated CK   . MI, acute, non ST segment elevation ([place]) 11/19/2019  . Rhabdomyolysis 11/19/2019  . 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 Atrial fibrillation ([place]) 11/19/2019  . Altered mental status 10/27/2018  . 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 Advanced care planning/counseling discussion 10/27/2018  . 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 Hallucinations 07/29/2015  . Hyperlipidemia   . Hypertension   . Insomnia    PAST MEDICAL HISTORY:  Active Ambulatory Problems    Diagnosis Date Noted  .  Hyperlipidemia   . Hypertension   . Insomnia   . Hallucinations 07/29/2015  . 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 Altered mental status 10/27/2018  . 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 Advanced care planning/counseling discussion 10/27/2018  . MI, acute, non ST segment elevation ([place]) 11/19/2019  . Rhabdomyolysis 11/19/2019  . Acute  MI, inferior wall ([place]) 11/19/2019  . Atrial fibrillation ([place]) 11/19/2019  . Fall   . Elevated CK   . Elevated troponin   . Palliative care encounter 04/02/2020   Resolved Ambulatory Problems    Diagnosis Date Noted  . Memory loss 07/29/2015   Past Medical History:  Diagnosis Date  . Allergy   . Dementia ([place])    SOCIAL HX:  Social History   Tobacco Use  . Smoking status: Never Smoker  . Smokeless tobacco: Never Used  Substance Use Topics  . Alcohol use: Yes    Alcohol/week: 14.0 standard drinks    Types: 14 Cans of beer per week   FAMILY HX:  Family History  Problem Relation Age of Onset  . Thyroid disease Sister     ALLERGIES:  Allergies  Allergen Reactions  . Ambien [Zolpidem]     Sleep walking     PERTINENT MEDICATIONS:  Outpatient Encounter Medications as of 09/01/2020  Medication Sig  . Amino Acids-Protein Hydrolys (FEEDING SUPPLEMENT, PRO-STAT SUGAR FREE 64,) LIQD Take 30 mLs by mouth 3 (three) times daily with meals.  Marland Kitchen ascorbic acid (VITAMIN C) 500 MG tablet Take 500 mg by mouth daily.  Marland Kitchen atenolol (TENORMIN) 50 MG tablet TAKE 1 TABLET (50 MG TOTAL) BY MOUTH DAILY.  . ciclopirox (LOPROX) 0.77 % cream Apply 1 application topically daily. To toenails  . fluticasone (FLONASE) 50 MCG/ACT nasal spray SPRAY 2 SPRAY IN EACH NOSTRIL DAILY (Patient taking differently: Place 2 sprays into both nostrils daily. SPRAY 2 SPRAY IN EACH NOSTRIL DAILY)  . melatonin 3 MG TABS tablet Take 3 mg by mouth at bedtime.  . mirtazapine (REMERON) 7.5 MG tablet Take 7.5 mg by mouth at bedtime.  . Multiple Vitamin (MULTIVITAMIN) tablet Take 1 tablet by mouth daily.  . polyethylene glycol (MIRALAX / GLYCOLAX) 17 g packet Take 17 g by mouth daily.  Marland Kitchen senna (SENOKOT) 8.6 MG tablet Take 1 tablet by mouth daily.  Marland Kitchen sulfamethoxazole-trimethoprim (BACTRIM DS) 800-160 MG tablet Take 1 tablet by mouth 2 (two) times daily.  . traMADol (ULTRAM) 50 MG tablet Take 50 mg by mouth every 4 (four)  hours as needed for moderate pain.  . traZODone (DESYREL) 150 MG tablet Take 50 mg by mouth at bedtime.   No facility-administered encounter medications on file as of 09/01/2020.    Objective: ROS  Per patient and staff .  General: NAD ENMT: denies dysphagia Pulmonary: denies  cough, denies increased SOB,  Abdomen: endorses fair appetite, denies constipation, endorses  incontinence of bowel GU: denies dysuria, endorses incontinence of urine MSK:  endorses ROM limitations, no falls reported Neurological: endorses weakness, denies pain, denies insomnia Psych: Endorses positive mood  Physical Exam: Current and past weights:unavailable Constitutional:  NAD General :frail appearing, thin EYES: anicteric sclera,lids intact, no discharge  ENMT: intact hearing,oral mucous membranes moist, dentition intact CV: , no LE edema Pulmonary: no increased work of breathing, no cough, no audible wheezes Abdomen: intake 50%,no ascites GU: deferred MSK: severe sacropenia, decreased ROM in all extremities, + contractures of LE, non ambulatory Skin: warm and dry, no rashes or wounds on visible skin Neuro: Weakness, severe cognitive impairment,  Psych: non -anxious affect, A and O x 1   Thank you for the opportunity to participate in the care of Tara Huffman.  The palliative care team will continue to follow. Please call our office at [phone] if we can be of additional assistance.  Jason Coop, NP , DNP, MPH, AGPCNP-BC, [facility]  COVID-19 PATIENT SCREENING TOOL  Person answering questions: ____________staff______ _____   1.  Is the patient or any family member in the home showing any signs or symptoms regarding respiratory infection?               Person with Symptom- __________NA_________________  a. Fever                                                                          Yes___ No___          ___________________  b. Shortness of breath                                                     Yes___ No___          ___________________ c. Cough/congestion                                       Yes___  No___         ___________________ d. Body aches/pains                                                         Yes___ No___        ____________________ e. Gastrointestinal symptoms (diarrhea, nausea)           Yes___ No___        ____________________  2. Within the past 14 days, has anyone living in the home had any contact with someone with or under investigation for COVID-19?    Yes___ No_X_   Person __________________

## 2020-09-02 DIAGNOSIS — M62462 Contracture of muscle, left lower leg: Secondary | ICD-10-CM | POA: Diagnosis not present

## 2020-09-02 DIAGNOSIS — F0391 Unspecified dementia with behavioral disturbance: Secondary | ICD-10-CM | POA: Diagnosis not present

## 2020-09-02 DIAGNOSIS — M62461 Contracture of muscle, right lower leg: Secondary | ICD-10-CM | POA: Diagnosis not present

## 2020-09-02 DIAGNOSIS — R0981 Nasal congestion: Secondary | ICD-10-CM | POA: Diagnosis not present

## 2020-09-02 DIAGNOSIS — I2510 Atherosclerotic heart disease of native coronary artery without angina pectoris: Secondary | ICD-10-CM | POA: Diagnosis not present

## 2020-09-02 DIAGNOSIS — R1312 Dysphagia, oropharyngeal phase: Secondary | ICD-10-CM | POA: Diagnosis not present

## 2020-09-02 DIAGNOSIS — I10 Essential (primary) hypertension: Secondary | ICD-10-CM | POA: Diagnosis not present

## 2020-09-02 DIAGNOSIS — I4891 Unspecified atrial fibrillation: Secondary | ICD-10-CM | POA: Diagnosis not present

## 2020-09-02 DIAGNOSIS — F0150 Vascular dementia without behavioral disturbance: Secondary | ICD-10-CM | POA: Diagnosis not present

## 2020-09-03 DIAGNOSIS — M62461 Contracture of muscle, right lower leg: Secondary | ICD-10-CM | POA: Diagnosis not present

## 2020-09-03 DIAGNOSIS — I10 Essential (primary) hypertension: Secondary | ICD-10-CM | POA: Diagnosis not present

## 2020-09-03 DIAGNOSIS — I4891 Unspecified atrial fibrillation: Secondary | ICD-10-CM | POA: Diagnosis not present

## 2020-09-03 DIAGNOSIS — R1312 Dysphagia, oropharyngeal phase: Secondary | ICD-10-CM | POA: Diagnosis not present

## 2020-09-03 DIAGNOSIS — R0981 Nasal congestion: Secondary | ICD-10-CM | POA: Diagnosis not present

## 2020-09-03 DIAGNOSIS — L89154 Pressure ulcer of sacral region, stage 4: Secondary | ICD-10-CM | POA: Diagnosis not present

## 2020-09-03 DIAGNOSIS — F0391 Unspecified dementia with behavioral disturbance: Secondary | ICD-10-CM | POA: Diagnosis not present

## 2020-09-03 DIAGNOSIS — M62462 Contracture of muscle, left lower leg: Secondary | ICD-10-CM | POA: Diagnosis not present

## 2020-09-03 DIAGNOSIS — F0150 Vascular dementia without behavioral disturbance: Secondary | ICD-10-CM | POA: Diagnosis not present

## 2020-09-03 DIAGNOSIS — I2510 Atherosclerotic heart disease of native coronary artery without angina pectoris: Secondary | ICD-10-CM | POA: Diagnosis not present

## 2020-09-04 DIAGNOSIS — R0981 Nasal congestion: Secondary | ICD-10-CM | POA: Diagnosis not present

## 2020-09-04 DIAGNOSIS — I2510 Atherosclerotic heart disease of native coronary artery without angina pectoris: Secondary | ICD-10-CM | POA: Diagnosis not present

## 2020-09-04 DIAGNOSIS — I10 Essential (primary) hypertension: Secondary | ICD-10-CM | POA: Diagnosis not present

## 2020-09-04 DIAGNOSIS — R1312 Dysphagia, oropharyngeal phase: Secondary | ICD-10-CM | POA: Diagnosis not present

## 2020-09-04 DIAGNOSIS — F0391 Unspecified dementia with behavioral disturbance: Secondary | ICD-10-CM | POA: Diagnosis not present

## 2020-09-04 DIAGNOSIS — I4891 Unspecified atrial fibrillation: Secondary | ICD-10-CM | POA: Diagnosis not present

## 2020-09-04 DIAGNOSIS — M62462 Contracture of muscle, left lower leg: Secondary | ICD-10-CM | POA: Diagnosis not present

## 2020-09-04 DIAGNOSIS — F0150 Vascular dementia without behavioral disturbance: Secondary | ICD-10-CM | POA: Diagnosis not present

## 2020-09-04 DIAGNOSIS — M62461 Contracture of muscle, right lower leg: Secondary | ICD-10-CM | POA: Diagnosis not present

## 2020-09-05 DIAGNOSIS — M62462 Contracture of muscle, left lower leg: Secondary | ICD-10-CM | POA: Diagnosis not present

## 2020-09-05 DIAGNOSIS — F0391 Unspecified dementia with behavioral disturbance: Secondary | ICD-10-CM | POA: Diagnosis not present

## 2020-09-05 DIAGNOSIS — I10 Essential (primary) hypertension: Secondary | ICD-10-CM | POA: Diagnosis not present

## 2020-09-05 DIAGNOSIS — I2510 Atherosclerotic heart disease of native coronary artery without angina pectoris: Secondary | ICD-10-CM | POA: Diagnosis not present

## 2020-09-05 DIAGNOSIS — F0150 Vascular dementia without behavioral disturbance: Secondary | ICD-10-CM | POA: Diagnosis not present

## 2020-09-05 DIAGNOSIS — R1312 Dysphagia, oropharyngeal phase: Secondary | ICD-10-CM | POA: Diagnosis not present

## 2020-09-05 DIAGNOSIS — I4891 Unspecified atrial fibrillation: Secondary | ICD-10-CM | POA: Diagnosis not present

## 2020-09-05 DIAGNOSIS — R0981 Nasal congestion: Secondary | ICD-10-CM | POA: Diagnosis not present

## 2020-09-05 DIAGNOSIS — M62461 Contracture of muscle, right lower leg: Secondary | ICD-10-CM | POA: Diagnosis not present

## 2020-09-08 DIAGNOSIS — I4891 Unspecified atrial fibrillation: Secondary | ICD-10-CM | POA: Diagnosis not present

## 2020-09-08 DIAGNOSIS — I10 Essential (primary) hypertension: Secondary | ICD-10-CM | POA: Diagnosis not present

## 2020-09-08 DIAGNOSIS — I2510 Atherosclerotic heart disease of native coronary artery without angina pectoris: Secondary | ICD-10-CM | POA: Diagnosis not present

## 2020-09-08 DIAGNOSIS — R0981 Nasal congestion: Secondary | ICD-10-CM | POA: Diagnosis not present

## 2020-09-08 DIAGNOSIS — F0391 Unspecified dementia with behavioral disturbance: Secondary | ICD-10-CM | POA: Diagnosis not present

## 2020-09-08 DIAGNOSIS — R1312 Dysphagia, oropharyngeal phase: Secondary | ICD-10-CM | POA: Diagnosis not present

## 2020-09-08 DIAGNOSIS — M62462 Contracture of muscle, left lower leg: Secondary | ICD-10-CM | POA: Diagnosis not present

## 2020-09-08 DIAGNOSIS — M62461 Contracture of muscle, right lower leg: Secondary | ICD-10-CM | POA: Diagnosis not present

## 2020-09-08 DIAGNOSIS — F0150 Vascular dementia without behavioral disturbance: Secondary | ICD-10-CM | POA: Diagnosis not present

## 2020-09-09 DIAGNOSIS — M62462 Contracture of muscle, left lower leg: Secondary | ICD-10-CM | POA: Diagnosis not present

## 2020-09-09 DIAGNOSIS — L89154 Pressure ulcer of sacral region, stage 4: Secondary | ICD-10-CM | POA: Diagnosis not present

## 2020-09-09 DIAGNOSIS — F0391 Unspecified dementia with behavioral disturbance: Secondary | ICD-10-CM | POA: Diagnosis not present

## 2020-09-09 DIAGNOSIS — M24562 Contracture, left knee: Secondary | ICD-10-CM | POA: Diagnosis not present

## 2020-09-09 DIAGNOSIS — I4891 Unspecified atrial fibrillation: Secondary | ICD-10-CM | POA: Diagnosis not present

## 2020-09-09 DIAGNOSIS — I2510 Atherosclerotic heart disease of native coronary artery without angina pectoris: Secondary | ICD-10-CM | POA: Diagnosis not present

## 2020-09-09 DIAGNOSIS — R0981 Nasal congestion: Secondary | ICD-10-CM | POA: Diagnosis not present

## 2020-09-09 DIAGNOSIS — F0150 Vascular dementia without behavioral disturbance: Secondary | ICD-10-CM | POA: Diagnosis not present

## 2020-09-09 DIAGNOSIS — M24561 Contracture, right knee: Secondary | ICD-10-CM | POA: Diagnosis not present

## 2020-09-09 DIAGNOSIS — I10 Essential (primary) hypertension: Secondary | ICD-10-CM | POA: Diagnosis not present

## 2020-09-09 DIAGNOSIS — R1312 Dysphagia, oropharyngeal phase: Secondary | ICD-10-CM | POA: Diagnosis not present

## 2020-09-09 DIAGNOSIS — M62461 Contracture of muscle, right lower leg: Secondary | ICD-10-CM | POA: Diagnosis not present

## 2020-09-10 DIAGNOSIS — R1312 Dysphagia, oropharyngeal phase: Secondary | ICD-10-CM | POA: Diagnosis not present

## 2020-09-10 DIAGNOSIS — M62462 Contracture of muscle, left lower leg: Secondary | ICD-10-CM | POA: Diagnosis not present

## 2020-09-10 DIAGNOSIS — M62461 Contracture of muscle, right lower leg: Secondary | ICD-10-CM | POA: Diagnosis not present

## 2020-09-10 DIAGNOSIS — R0981 Nasal congestion: Secondary | ICD-10-CM | POA: Diagnosis not present

## 2020-09-10 DIAGNOSIS — F0150 Vascular dementia without behavioral disturbance: Secondary | ICD-10-CM | POA: Diagnosis not present

## 2020-09-10 DIAGNOSIS — I4891 Unspecified atrial fibrillation: Secondary | ICD-10-CM | POA: Diagnosis not present

## 2020-09-10 DIAGNOSIS — F0391 Unspecified dementia with behavioral disturbance: Secondary | ICD-10-CM | POA: Diagnosis not present

## 2020-09-10 DIAGNOSIS — I2510 Atherosclerotic heart disease of native coronary artery without angina pectoris: Secondary | ICD-10-CM | POA: Diagnosis not present

## 2020-09-10 DIAGNOSIS — I10 Essential (primary) hypertension: Secondary | ICD-10-CM | POA: Diagnosis not present

## 2020-09-11 DIAGNOSIS — M62461 Contracture of muscle, right lower leg: Secondary | ICD-10-CM | POA: Diagnosis not present

## 2020-09-11 DIAGNOSIS — I4891 Unspecified atrial fibrillation: Secondary | ICD-10-CM | POA: Diagnosis not present

## 2020-09-11 DIAGNOSIS — M62462 Contracture of muscle, left lower leg: Secondary | ICD-10-CM | POA: Diagnosis not present

## 2020-09-11 DIAGNOSIS — I10 Essential (primary) hypertension: Secondary | ICD-10-CM | POA: Diagnosis not present

## 2020-09-11 DIAGNOSIS — R0981 Nasal congestion: Secondary | ICD-10-CM | POA: Diagnosis not present

## 2020-09-11 DIAGNOSIS — I2510 Atherosclerotic heart disease of native coronary artery without angina pectoris: Secondary | ICD-10-CM | POA: Diagnosis not present

## 2020-09-11 DIAGNOSIS — F0391 Unspecified dementia with behavioral disturbance: Secondary | ICD-10-CM | POA: Diagnosis not present

## 2020-09-11 DIAGNOSIS — F0150 Vascular dementia without behavioral disturbance: Secondary | ICD-10-CM | POA: Diagnosis not present

## 2020-09-11 DIAGNOSIS — R1312 Dysphagia, oropharyngeal phase: Secondary | ICD-10-CM | POA: Diagnosis not present

## 2020-09-12 DIAGNOSIS — M62461 Contracture of muscle, right lower leg: Secondary | ICD-10-CM | POA: Diagnosis not present

## 2020-09-12 DIAGNOSIS — R0981 Nasal congestion: Secondary | ICD-10-CM | POA: Diagnosis not present

## 2020-09-12 DIAGNOSIS — I2510 Atherosclerotic heart disease of native coronary artery without angina pectoris: Secondary | ICD-10-CM | POA: Diagnosis not present

## 2020-09-12 DIAGNOSIS — F0150 Vascular dementia without behavioral disturbance: Secondary | ICD-10-CM | POA: Diagnosis not present

## 2020-09-12 DIAGNOSIS — F0391 Unspecified dementia with behavioral disturbance: Secondary | ICD-10-CM | POA: Diagnosis not present

## 2020-09-12 DIAGNOSIS — I4891 Unspecified atrial fibrillation: Secondary | ICD-10-CM | POA: Diagnosis not present

## 2020-09-12 DIAGNOSIS — I10 Essential (primary) hypertension: Secondary | ICD-10-CM | POA: Diagnosis not present

## 2020-09-12 DIAGNOSIS — R1312 Dysphagia, oropharyngeal phase: Secondary | ICD-10-CM | POA: Diagnosis not present

## 2020-09-12 DIAGNOSIS — M62462 Contracture of muscle, left lower leg: Secondary | ICD-10-CM | POA: Diagnosis not present

## 2020-09-16 DIAGNOSIS — E640 Sequelae of protein-calorie malnutrition: Secondary | ICD-10-CM | POA: Diagnosis not present

## 2020-09-16 DIAGNOSIS — I10 Essential (primary) hypertension: Secondary | ICD-10-CM | POA: Diagnosis not present

## 2020-09-16 DIAGNOSIS — M62462 Contracture of muscle, left lower leg: Secondary | ICD-10-CM | POA: Diagnosis not present

## 2020-09-16 DIAGNOSIS — R451 Restlessness and agitation: Secondary | ICD-10-CM | POA: Diagnosis not present

## 2020-09-16 DIAGNOSIS — G4701 Insomnia due to medical condition: Secondary | ICD-10-CM | POA: Diagnosis not present

## 2020-09-16 DIAGNOSIS — R0981 Nasal congestion: Secondary | ICD-10-CM | POA: Diagnosis not present

## 2020-09-16 DIAGNOSIS — R1312 Dysphagia, oropharyngeal phase: Secondary | ICD-10-CM | POA: Diagnosis not present

## 2020-09-16 DIAGNOSIS — R296 Repeated falls: Secondary | ICD-10-CM | POA: Diagnosis not present

## 2020-09-16 DIAGNOSIS — F0151 Vascular dementia with behavioral disturbance: Secondary | ICD-10-CM | POA: Diagnosis not present

## 2020-09-16 DIAGNOSIS — F0150 Vascular dementia without behavioral disturbance: Secondary | ICD-10-CM | POA: Diagnosis not present

## 2020-09-16 DIAGNOSIS — F0391 Unspecified dementia with behavioral disturbance: Secondary | ICD-10-CM | POA: Diagnosis not present

## 2020-09-16 DIAGNOSIS — I4891 Unspecified atrial fibrillation: Secondary | ICD-10-CM | POA: Diagnosis not present

## 2020-09-16 DIAGNOSIS — M62461 Contracture of muscle, right lower leg: Secondary | ICD-10-CM | POA: Diagnosis not present

## 2020-09-16 DIAGNOSIS — I2510 Atherosclerotic heart disease of native coronary artery without angina pectoris: Secondary | ICD-10-CM | POA: Diagnosis not present

## 2020-09-17 DIAGNOSIS — L89154 Pressure ulcer of sacral region, stage 4: Secondary | ICD-10-CM | POA: Diagnosis not present

## 2020-09-17 DIAGNOSIS — I2510 Atherosclerotic heart disease of native coronary artery without angina pectoris: Secondary | ICD-10-CM | POA: Diagnosis not present

## 2020-09-17 DIAGNOSIS — F0391 Unspecified dementia with behavioral disturbance: Secondary | ICD-10-CM | POA: Diagnosis not present

## 2020-09-17 DIAGNOSIS — M62461 Contracture of muscle, right lower leg: Secondary | ICD-10-CM | POA: Diagnosis not present

## 2020-09-17 DIAGNOSIS — F0150 Vascular dementia without behavioral disturbance: Secondary | ICD-10-CM | POA: Diagnosis not present

## 2020-09-17 DIAGNOSIS — R0981 Nasal congestion: Secondary | ICD-10-CM | POA: Diagnosis not present

## 2020-09-17 DIAGNOSIS — M6281 Muscle weakness (generalized): Secondary | ICD-10-CM | POA: Diagnosis not present

## 2020-09-17 DIAGNOSIS — R498 Other voice and resonance disorders: Secondary | ICD-10-CM | POA: Diagnosis not present

## 2020-09-17 DIAGNOSIS — M62462 Contracture of muscle, left lower leg: Secondary | ICD-10-CM | POA: Diagnosis not present

## 2020-09-17 DIAGNOSIS — R488 Other symbolic dysfunctions: Secondary | ICD-10-CM | POA: Diagnosis not present

## 2020-09-18 DIAGNOSIS — R498 Other voice and resonance disorders: Secondary | ICD-10-CM | POA: Diagnosis not present

## 2020-09-18 DIAGNOSIS — F0150 Vascular dementia without behavioral disturbance: Secondary | ICD-10-CM | POA: Diagnosis not present

## 2020-09-18 DIAGNOSIS — M62462 Contracture of muscle, left lower leg: Secondary | ICD-10-CM | POA: Diagnosis not present

## 2020-09-18 DIAGNOSIS — R0981 Nasal congestion: Secondary | ICD-10-CM | POA: Diagnosis not present

## 2020-09-18 DIAGNOSIS — M6281 Muscle weakness (generalized): Secondary | ICD-10-CM | POA: Diagnosis not present

## 2020-09-18 DIAGNOSIS — R488 Other symbolic dysfunctions: Secondary | ICD-10-CM | POA: Diagnosis not present

## 2020-09-18 DIAGNOSIS — F0391 Unspecified dementia with behavioral disturbance: Secondary | ICD-10-CM | POA: Diagnosis not present

## 2020-09-18 DIAGNOSIS — I2510 Atherosclerotic heart disease of native coronary artery without angina pectoris: Secondary | ICD-10-CM | POA: Diagnosis not present

## 2020-09-18 DIAGNOSIS — M62461 Contracture of muscle, right lower leg: Secondary | ICD-10-CM | POA: Diagnosis not present

## 2020-09-19 DIAGNOSIS — R0981 Nasal congestion: Secondary | ICD-10-CM | POA: Diagnosis not present

## 2020-09-19 DIAGNOSIS — R488 Other symbolic dysfunctions: Secondary | ICD-10-CM | POA: Diagnosis not present

## 2020-09-19 DIAGNOSIS — M62461 Contracture of muscle, right lower leg: Secondary | ICD-10-CM | POA: Diagnosis not present

## 2020-09-19 DIAGNOSIS — M6281 Muscle weakness (generalized): Secondary | ICD-10-CM | POA: Diagnosis not present

## 2020-09-19 DIAGNOSIS — R498 Other voice and resonance disorders: Secondary | ICD-10-CM | POA: Diagnosis not present

## 2020-09-19 DIAGNOSIS — F0150 Vascular dementia without behavioral disturbance: Secondary | ICD-10-CM | POA: Diagnosis not present

## 2020-09-19 DIAGNOSIS — I2510 Atherosclerotic heart disease of native coronary artery without angina pectoris: Secondary | ICD-10-CM | POA: Diagnosis not present

## 2020-09-19 DIAGNOSIS — M62462 Contracture of muscle, left lower leg: Secondary | ICD-10-CM | POA: Diagnosis not present

## 2020-09-19 DIAGNOSIS — F0391 Unspecified dementia with behavioral disturbance: Secondary | ICD-10-CM | POA: Diagnosis not present

## 2020-09-22 DIAGNOSIS — I2510 Atherosclerotic heart disease of native coronary artery without angina pectoris: Secondary | ICD-10-CM | POA: Diagnosis not present

## 2020-09-22 DIAGNOSIS — F0391 Unspecified dementia with behavioral disturbance: Secondary | ICD-10-CM | POA: Diagnosis not present

## 2020-09-22 DIAGNOSIS — M62461 Contracture of muscle, right lower leg: Secondary | ICD-10-CM | POA: Diagnosis not present

## 2020-09-22 DIAGNOSIS — F0150 Vascular dementia without behavioral disturbance: Secondary | ICD-10-CM | POA: Diagnosis not present

## 2020-09-22 DIAGNOSIS — R498 Other voice and resonance disorders: Secondary | ICD-10-CM | POA: Diagnosis not present

## 2020-09-22 DIAGNOSIS — M62462 Contracture of muscle, left lower leg: Secondary | ICD-10-CM | POA: Diagnosis not present

## 2020-09-22 DIAGNOSIS — M6281 Muscle weakness (generalized): Secondary | ICD-10-CM | POA: Diagnosis not present

## 2020-09-22 DIAGNOSIS — R488 Other symbolic dysfunctions: Secondary | ICD-10-CM | POA: Diagnosis not present

## 2020-09-22 DIAGNOSIS — R0981 Nasal congestion: Secondary | ICD-10-CM | POA: Diagnosis not present

## 2020-09-23 DIAGNOSIS — M6281 Muscle weakness (generalized): Secondary | ICD-10-CM | POA: Diagnosis not present

## 2020-09-23 DIAGNOSIS — R498 Other voice and resonance disorders: Secondary | ICD-10-CM | POA: Diagnosis not present

## 2020-09-23 DIAGNOSIS — M62462 Contracture of muscle, left lower leg: Secondary | ICD-10-CM | POA: Diagnosis not present

## 2020-09-23 DIAGNOSIS — I2510 Atherosclerotic heart disease of native coronary artery without angina pectoris: Secondary | ICD-10-CM | POA: Diagnosis not present

## 2020-09-23 DIAGNOSIS — M62461 Contracture of muscle, right lower leg: Secondary | ICD-10-CM | POA: Diagnosis not present

## 2020-09-23 DIAGNOSIS — R0981 Nasal congestion: Secondary | ICD-10-CM | POA: Diagnosis not present

## 2020-09-23 DIAGNOSIS — F0391 Unspecified dementia with behavioral disturbance: Secondary | ICD-10-CM | POA: Diagnosis not present

## 2020-09-23 DIAGNOSIS — F0150 Vascular dementia without behavioral disturbance: Secondary | ICD-10-CM | POA: Diagnosis not present

## 2020-09-23 DIAGNOSIS — R488 Other symbolic dysfunctions: Secondary | ICD-10-CM | POA: Diagnosis not present

## 2020-10-01 ENCOUNTER — Other Ambulatory Visit: Payer: Self-pay

## 2020-10-01 ENCOUNTER — Non-Acute Institutional Stay: Payer: Medicare HMO | Admitting: Primary Care

## 2020-10-01 DIAGNOSIS — Z515 Encounter for palliative care: Secondary | ICD-10-CM | POA: Diagnosis not present

## 2020-10-01 DIAGNOSIS — F0390 Unspecified dementia without behavioral disturbance: Secondary | ICD-10-CM | POA: Diagnosis not present

## 2020-10-01 DIAGNOSIS — L89154 Pressure ulcer of sacral region, stage 4: Secondary | ICD-10-CM | POA: Diagnosis not present

## 2020-10-01 DIAGNOSIS — F03C0 Unspecified dementia, severe, without behavioral disturbance, psychotic disturbance, mood disturbance, and anxiety: Secondary | ICD-10-CM

## 2020-10-01 NOTE — Progress Notes (Signed)
[facility] Consult Note Telephone: [phone]  Fax: [phone]     Date of encounter: 10/01/20 PATIENT NAME: Tara Huffman [address] [place] [address] [phone] (home)  DOB: 04/16/1941 MRN: [id number]  PRIMARY CARE PROVIDER:    Rica Koyanagi, MD,  [place] [address] [phone]  REFERRING PROVIDER:   Rica Koyanagi, [place],  [place] [address] [phone]  RESPONSIBLE PARTY:   Extended Emergency Contact Information Primary Emergency Contact: Carmelina Paddock [place] of [place] Phone: [phone] Mobile Phone: [phone] Relation: Daughter Secondary Emergency Contact: Moore,Carolyn  [place] Phone: [phone] Relation: Sister  I met face to face with patient in facility. Palliative Care was asked to follow this patient by consultation request of Rica Koyanagi, MD to help address advance care planning and goals of care. This is a follow up  visit.   ASSESSMENT AND RECOMMENDATIONS:   1. Advance Care Planning/Goals of Care: Goals include to maximize quality of life and symptom management. DNR, limited scope. T/c to POA Pam, message left as no answer.  2. Symptom Management:   Patient has better intake now, more alert and is able to propel self in SNF.   Staff endorse continued agitation and indeed she is asking staff and visitors to get out of her home as she's expecting company. Order given to run a u/a and c/s and report to SNF medical team for f/u.  I am also happy to review results faxed to [phone].  Recommend continued f/u by psych for any behavioal medication management.   3. Follow up Palliative Care Visit: Palliative care will continue to follow for goals of care clarification and symptom management. Return 4-6 weeks or prn.  4. Family /Caregiver/Community Supports:  [name] is daughter, [place]. Lives in [place].  5. Cognitive /  Functional decline: A and O x 1, severe cognitive impairment, dependent in most alds, can feed self on occasion.  I spent 25 minutes providing this consultation,  from 1000 to 1025. More than 50% of the time in this consultation was spent coordinating communication.   CODE STATUS: DNR  PPS: 40%  HOSPICE ELIGIBILITY/DIAGNOSIS: TBD  Subjective:  CHIEF COMPLAINT: agitation  HISTORY OF PRESENT ILLNESS:  Tara Huffman is a 79 y.o. year old female  with h/o alcohol abuse and dementia, frequent falls at home, agitation. She had declined for some weeks but then rallied, began eating and becoming more active. She has a baseline of agitation but staff endorse this is worse and suspect infection.   We are asked to consult around advance care planning and complex medical decision making.    History obtained from review of EMR, discussion with primary team, and  interview with family, caregiver  and/or Ms. Kamara. Records reviewed and summarized above.   CURRENT PROBLEM LIST:  Patient Active Problem List   Diagnosis Date Noted  . Palliative care encounter 04/02/2020  . Elevated troponin   . Fall   . Elevated CK   . MI, acute, non ST segment elevation ([facility]) 11/19/2019  . Rhabdomyolysis 11/19/2019  . 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 Atrial fibrillation ([place]) 11/19/2019  . Altered mental status 10/27/2018  . 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 Advanced care planning/counseling discussion 10/27/2018  . 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 Hallucinations 07/29/2015  . Hyperlipidemia   . Hypertension   . Insomnia    PAST MEDICAL HISTORY:  Active Ambulatory Problems  Diagnosis Date Noted  . Hyperlipidemia   . Hypertension   . Insomnia   . Hallucinations 07/29/2015  . Mild dementia ([facility]) 07/27/2016  . Altered mental status 10/27/2018  . 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 Advanced care planning/counseling discussion 10/27/2018  . MI, acute, non ST segment elevation ([facility])  11/19/2019  . Rhabdomyolysis 11/19/2019  . Acute MI, inferior wall ([place] [facility]) 11/19/2019  . Atrial fibrillation ([place]) 11/19/2019  . Fall   . Elevated CK   . Elevated troponin   . Palliative care encounter 04/02/2020   Resolved Ambulatory Problems    Diagnosis Date Noted  . Memory loss 07/29/2015   Past Medical History:  Diagnosis Date  . Allergy   . Dementia ([facility])    SOCIAL HX:  Social History   Tobacco Use  . Smoking status: Never Smoker  . Smokeless tobacco: Never Used  Substance Use Topics  . Alcohol use: Yes    Alcohol/week: 14.0 standard drinks    Types: 14 Cans of beer per week   FAMILY HX:  Family History  Problem Relation Age of Onset  . Thyroid disease Sister       ALLERGIES:  Allergies  Allergen Reactions  . Ambien [Zolpidem]     Sleep walking     PERTINENT MEDICATIONS:  Outpatient Encounter Medications as of 10/01/2020  Medication Sig  . Amino Acids-Protein Hydrolys (FEEDING SUPPLEMENT, PRO-STAT SUGAR FREE 64,) LIQD Take 30 mLs by mouth 3 (three) times daily with meals.  Marland Kitchen ascorbic acid (VITAMIN C) 500 MG tablet Take 500 mg by mouth daily.  Marland Kitchen atenolol (TENORMIN) 50 MG tablet TAKE 1 TABLET (50 MG TOTAL) BY MOUTH DAILY.  . ciclopirox (LOPROX) 0.77 % cream Apply 1 application topically daily. To toenails  . fluticasone (FLONASE) 50 MCG/ACT nasal spray SPRAY 2 SPRAY IN EACH NOSTRIL DAILY (Patient taking differently: Place 2 sprays into both nostrils daily. SPRAY 2 SPRAY IN EACH NOSTRIL DAILY)  . melatonin 3 MG TABS tablet Take 3 mg by mouth at bedtime.  . mirtazapine (REMERON) 7.5 MG tablet Take 7.5 mg by mouth at bedtime.  . Multiple Vitamin (MULTIVITAMIN) tablet Take 1 tablet by mouth daily.  . polyethylene glycol (MIRALAX / GLYCOLAX) 17 g packet Take 17 g by mouth daily.  Marland Kitchen senna (SENOKOT) 8.6 MG tablet Take 1 tablet by mouth daily.  Marland Kitchen sulfamethoxazole-trimethoprim (BACTRIM DS) 800-160 MG tablet Take 1 tablet by mouth 2 (two) times daily.  . traMADol  (ULTRAM) 50 MG tablet Take 50 mg by mouth every 4 (four) hours as needed for moderate pain.  . traZODone (DESYREL) 150 MG tablet Take 50 mg by mouth at bedtime.   No facility-administered encounter medications on file as of 10/01/2020.    Objective: ROS/staff report  General: agitated EYES: denies vision changes, wears glasses ENMT: denies dysphagia Pulmonary: denies  cough, denies increased SOB Abdomen: endorses fair appetite,endorses incontinence of bowel GU: denies dysuria, endorses incontinence of urine MSK:  endorses ROM limitations, no falls reported Skin: denies rashes or wounds Neurological: denies increased weakness, denies pain, denies insomnia Psych: Endorses  Agitated mood   Physical Exam: Current and past weights: stable Constitutional: NAD General: frail appearing, thin EYES: anicteric sclera,lids intact, no discharge  ENMT: intact hearing,oral mucous membranes moist CV: no LE edema Pulmonary: no increased work of breathing, no cough, no audible wheezes, room air Abdomen: intake 50%, no ascites MSK: severe  sarcopenia, decreased ROM in all extremities,  non ambulatory, in w/c. Skin: warm and dry, no rashes or wounds  on visible skin Neuro: Generalized weakness, severe cognitive impairment Psych: non-anxious affect, A and O x 1   Thank you for the opportunity to participate in the care of Ms. Ortmann.  The palliative care team will continue to follow. Please call our office at [phone] if we can be of additional assistance.  Jason Coop, NP , DNP, MPH, AGPCNP-BC, ACHPN  COVID-19 PATIENT SCREENING TOOL  Person answering questions: ____________staff______ _____   1.  Is the patient or any family member in the home showing any signs or symptoms regarding respiratory infection?               Person with Symptom- __________NA_________________  a. Fever                                                                          Yes___ No___           ___________________  b. Shortness of breath                                                    Yes___ No___          ___________________ c. Cough/congestion                                       Yes___  No___         ___________________ d. Body aches/pains                                                         Yes___ No___        ____________________ e. Gastrointestinal symptoms (diarrhea, nausea)           Yes___ No___        ____________________  2. Within the past 14 days, has anyone living in the home had any contact with someone with or under investigation for COVID-19?    Yes___ No_X_   Person __________________

## 2020-10-02 DIAGNOSIS — M1000 Idiopathic gout, unspecified site: Secondary | ICD-10-CM | POA: Diagnosis not present

## 2020-10-03 DIAGNOSIS — I4891 Unspecified atrial fibrillation: Secondary | ICD-10-CM | POA: Diagnosis not present

## 2020-10-03 DIAGNOSIS — F0391 Unspecified dementia with behavioral disturbance: Secondary | ICD-10-CM | POA: Diagnosis not present

## 2020-10-03 DIAGNOSIS — W19XXXA Unspecified fall, initial encounter: Secondary | ICD-10-CM | POA: Diagnosis not present

## 2020-10-03 DIAGNOSIS — I10 Essential (primary) hypertension: Secondary | ICD-10-CM | POA: Diagnosis not present

## 2020-10-07 DIAGNOSIS — I10 Essential (primary) hypertension: Secondary | ICD-10-CM | POA: Diagnosis not present

## 2020-10-07 DIAGNOSIS — I4891 Unspecified atrial fibrillation: Secondary | ICD-10-CM | POA: Diagnosis not present

## 2020-10-07 DIAGNOSIS — F0391 Unspecified dementia with behavioral disturbance: Secondary | ICD-10-CM | POA: Diagnosis not present

## 2020-10-08 ENCOUNTER — Non-Acute Institutional Stay: Payer: Medicare HMO | Admitting: Primary Care

## 2020-10-21 DIAGNOSIS — E640 Sequelae of protein-calorie malnutrition: Secondary | ICD-10-CM | POA: Diagnosis not present

## 2020-10-21 DIAGNOSIS — R296 Repeated falls: Secondary | ICD-10-CM | POA: Diagnosis not present

## 2020-10-21 DIAGNOSIS — G4701 Insomnia due to medical condition: Secondary | ICD-10-CM | POA: Diagnosis not present

## 2020-10-21 DIAGNOSIS — R451 Restlessness and agitation: Secondary | ICD-10-CM | POA: Diagnosis not present

## 2020-10-21 DIAGNOSIS — F0151 Vascular dementia with behavioral disturbance: Secondary | ICD-10-CM | POA: Diagnosis not present

## 2020-10-22 DIAGNOSIS — F0391 Unspecified dementia with behavioral disturbance: Secondary | ICD-10-CM | POA: Diagnosis not present

## 2020-10-22 DIAGNOSIS — L89154 Pressure ulcer of sacral region, stage 4: Secondary | ICD-10-CM | POA: Diagnosis not present

## 2020-10-22 DIAGNOSIS — I4891 Unspecified atrial fibrillation: Secondary | ICD-10-CM | POA: Diagnosis not present

## 2020-10-22 DIAGNOSIS — I10 Essential (primary) hypertension: Secondary | ICD-10-CM | POA: Diagnosis not present

## 2020-10-29 DIAGNOSIS — L89154 Pressure ulcer of sacral region, stage 4: Secondary | ICD-10-CM | POA: Diagnosis not present

## 2020-11-05 DIAGNOSIS — Z872 Personal history of diseases of the skin and subcutaneous tissue: Secondary | ICD-10-CM | POA: Diagnosis not present

## 2020-11-05 DIAGNOSIS — L89154 Pressure ulcer of sacral region, stage 4: Secondary | ICD-10-CM | POA: Diagnosis not present

## 2020-11-12 DIAGNOSIS — L89154 Pressure ulcer of sacral region, stage 4: Secondary | ICD-10-CM | POA: Diagnosis not present

## 2020-11-18 DIAGNOSIS — G4701 Insomnia due to medical condition: Secondary | ICD-10-CM | POA: Diagnosis not present

## 2020-11-18 DIAGNOSIS — R296 Repeated falls: Secondary | ICD-10-CM | POA: Diagnosis not present

## 2020-11-18 DIAGNOSIS — R451 Restlessness and agitation: Secondary | ICD-10-CM | POA: Diagnosis not present

## 2020-11-18 DIAGNOSIS — E640 Sequelae of protein-calorie malnutrition: Secondary | ICD-10-CM | POA: Diagnosis not present

## 2020-11-18 DIAGNOSIS — F0151 Vascular dementia with behavioral disturbance: Secondary | ICD-10-CM | POA: Diagnosis not present

## 2020-11-19 DIAGNOSIS — L89154 Pressure ulcer of sacral region, stage 4: Secondary | ICD-10-CM | POA: Diagnosis not present

## 2020-11-24 DIAGNOSIS — G4700 Insomnia, unspecified: Secondary | ICD-10-CM | POA: Diagnosis not present

## 2020-11-24 DIAGNOSIS — M62462 Contracture of muscle, left lower leg: Secondary | ICD-10-CM | POA: Diagnosis not present

## 2020-11-24 DIAGNOSIS — M6281 Muscle weakness (generalized): Secondary | ICD-10-CM | POA: Diagnosis not present

## 2020-11-24 DIAGNOSIS — I4891 Unspecified atrial fibrillation: Secondary | ICD-10-CM | POA: Diagnosis not present

## 2020-11-24 DIAGNOSIS — E569 Vitamin deficiency, unspecified: Secondary | ICD-10-CM | POA: Diagnosis not present

## 2020-11-24 DIAGNOSIS — F0151 Vascular dementia with behavioral disturbance: Secondary | ICD-10-CM | POA: Diagnosis not present

## 2020-11-24 DIAGNOSIS — F0150 Vascular dementia without behavioral disturbance: Secondary | ICD-10-CM | POA: Diagnosis not present

## 2020-11-24 DIAGNOSIS — M62461 Contracture of muscle, right lower leg: Secondary | ICD-10-CM | POA: Diagnosis not present

## 2020-11-25 DIAGNOSIS — M62461 Contracture of muscle, right lower leg: Secondary | ICD-10-CM | POA: Diagnosis not present

## 2020-11-25 DIAGNOSIS — M62462 Contracture of muscle, left lower leg: Secondary | ICD-10-CM | POA: Diagnosis not present

## 2020-11-25 DIAGNOSIS — F0150 Vascular dementia without behavioral disturbance: Secondary | ICD-10-CM | POA: Diagnosis not present

## 2020-11-25 DIAGNOSIS — E559 Vitamin D deficiency, unspecified: Secondary | ICD-10-CM | POA: Diagnosis not present

## 2020-11-25 DIAGNOSIS — H1131 Conjunctival hemorrhage, right eye: Secondary | ICD-10-CM | POA: Diagnosis not present

## 2020-11-26 ENCOUNTER — Other Ambulatory Visit: Payer: Self-pay

## 2020-11-26 ENCOUNTER — Non-Acute Institutional Stay: Payer: Medicare HMO | Admitting: Primary Care

## 2020-11-26 DIAGNOSIS — M62462 Contracture of muscle, left lower leg: Secondary | ICD-10-CM | POA: Diagnosis not present

## 2020-11-26 DIAGNOSIS — M62461 Contracture of muscle, right lower leg: Secondary | ICD-10-CM | POA: Diagnosis not present

## 2020-11-27 DIAGNOSIS — M62461 Contracture of muscle, right lower leg: Secondary | ICD-10-CM | POA: Diagnosis not present

## 2020-11-27 DIAGNOSIS — M62462 Contracture of muscle, left lower leg: Secondary | ICD-10-CM | POA: Diagnosis not present

## 2020-11-28 DIAGNOSIS — M62462 Contracture of muscle, left lower leg: Secondary | ICD-10-CM | POA: Diagnosis not present

## 2020-11-28 DIAGNOSIS — M62461 Contracture of muscle, right lower leg: Secondary | ICD-10-CM | POA: Diagnosis not present

## 2020-12-01 DIAGNOSIS — M62462 Contracture of muscle, left lower leg: Secondary | ICD-10-CM | POA: Diagnosis not present

## 2020-12-01 DIAGNOSIS — M62461 Contracture of muscle, right lower leg: Secondary | ICD-10-CM | POA: Diagnosis not present

## 2020-12-02 DIAGNOSIS — M62462 Contracture of muscle, left lower leg: Secondary | ICD-10-CM | POA: Diagnosis not present

## 2020-12-02 DIAGNOSIS — M62461 Contracture of muscle, right lower leg: Secondary | ICD-10-CM | POA: Diagnosis not present

## 2020-12-03 DIAGNOSIS — M62462 Contracture of muscle, left lower leg: Secondary | ICD-10-CM | POA: Diagnosis not present

## 2020-12-03 DIAGNOSIS — M62461 Contracture of muscle, right lower leg: Secondary | ICD-10-CM | POA: Diagnosis not present

## 2020-12-04 DIAGNOSIS — M62462 Contracture of muscle, left lower leg: Secondary | ICD-10-CM | POA: Diagnosis not present

## 2020-12-04 DIAGNOSIS — M62461 Contracture of muscle, right lower leg: Secondary | ICD-10-CM | POA: Diagnosis not present

## 2020-12-05 ENCOUNTER — Other Ambulatory Visit: Payer: Self-pay

## 2020-12-05 ENCOUNTER — Non-Acute Institutional Stay: Payer: Medicare HMO | Admitting: Primary Care

## 2020-12-05 DIAGNOSIS — R259 Unspecified abnormal involuntary movements: Secondary | ICD-10-CM

## 2020-12-05 DIAGNOSIS — M62461 Contracture of muscle, right lower leg: Secondary | ICD-10-CM | POA: Diagnosis not present

## 2020-12-05 DIAGNOSIS — F0390 Unspecified dementia without behavioral disturbance: Secondary | ICD-10-CM

## 2020-12-05 DIAGNOSIS — Z515 Encounter for palliative care: Secondary | ICD-10-CM | POA: Diagnosis not present

## 2020-12-05 DIAGNOSIS — R29818 Other symptoms and signs involving the nervous system: Secondary | ICD-10-CM

## 2020-12-05 DIAGNOSIS — M62462 Contracture of muscle, left lower leg: Secondary | ICD-10-CM | POA: Diagnosis not present

## 2020-12-05 DIAGNOSIS — F03C0 Unspecified dementia, severe, without behavioral disturbance, psychotic disturbance, mood disturbance, and anxiety: Secondary | ICD-10-CM

## 2020-12-05 NOTE — Progress Notes (Signed)
[facility] Consult Note Telephone: [phone]  Fax: [phone]    Date of encounter: 12/05/20 PATIENT NAME: Tara Huffman [address] [place] [address] [phone] (home)  DOB: [birth year] MRN: [id number]  PRIMARY CARE PROVIDER:    Rica Koyanagi, MD,  [place] [address] [phone]  REFERRING PROVIDER:   Rica Koyanagi, [address],  [place] [address] [phone]  RESPONSIBLE PARTY:   Extended Emergency Contact Information Primary Emergency Contact: Carmelina Paddock [place] of [place] Phone: [phone] Mobile Phone: [phone] Relation: Daughter Secondary Emergency Contact: Moore,Carolyn  [place] Phone: [phone] Relation: Sister  I met face to face with patient in facility. Palliative Care was asked to follow this patient by consultation request of Rica Koyanagi, MD to help address advance care planning and goals of care. This is a follow up  visit.   ASSESSMENT AND RECOMMENDATIONS:    1. Advance Care Planning/Goals of Care: Goals include to maximize quality of life and symptom management. No changes with ACP, on file in Epic and nursing home.  2. Symptom Management:   Met with patient in her nursing home. She is pleasant today and interactive. She shows signs of advanced dementia and it's often verbally combative with staff and passersby. Today however she is smiling and relaxed. She denies hunger or pain. She's been sitting in her chair at the nurses station which she enjoys as she likes to talk to people. She has not had any recent medical issues and her weight is right 100 pounds, she  is eating well  with heavy assistance.   3. Follow up Palliative Care Visit: Palliative care will continue to follow for goals of care clarification and symptom management. Return 8 weeks or prn.  4. Family /Caregiver/Community Supports:   5. Cognitive /  Functional decline:   I spent 15 minutes providing this consultation,  from 1330 to 1345 . More than 50% of the time in this consultation was spent in counseling and care coordination.  CODE STATUS: DNR  PPS: 30%  HOSPICE ELIGIBILITY/DIAGNOSIS: TBD  Subjective:  CHIEF COMPLAINT: debility  HISTORY OF PRESENT ILLNESS:  Tara Huffman is a 80 y.o. year old female  with debility, dementia, FTT .   We are asked to consult around advance care planning and complex medical decision making.    Review and summarization of old Epic records shows or history from other than patient.  Review of case with family member.  SNF STAFF  History obtained from review of EMR, discussion with primary team, and  interview with family, caregiver  and/or Ms. Marrs. Records reviewed and summarized above.   CURRENT PROBLEM LIST:  Patient Active Problem List   Diagnosis Date Noted  . Palliative care encounter 04/02/2020  . Elevated troponin   . Fall   . Elevated CK   . MI, acute, non ST segment elevation ([facility]) 11/19/2019  . Rhabdomyolysis 11/19/2019  . 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 Atrial fibrillation ([facility]) 11/19/2019  . Altered mental status 10/27/2018  . 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 Advanced care planning/counseling discussion 10/27/2018  . Mild dementia ([facility]) 07/27/2016  . Hallucinations 07/29/2015  . Hyperlipidemia   . Hypertension   . Insomnia    PAST MEDICAL HISTORY:  Active Ambulatory Problems    Diagnosis Date Noted  . Hyperlipidemia   . Hypertension   . Insomnia   . Hallucinations 07/29/2015  . Mild  dementia ([facility]) 07/27/2016  . Altered mental status 10/27/2018  . 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 Advanced care planning/counseling discussion 10/27/2018  . MI, acute, non ST segment elevation ([facility]) 11/19/2019  . Rhabdomyolysis 11/19/2019  . Acute MI, inferior wall ([facility]) 11/19/2019  . Atrial fibrillation ([facility]) 11/19/2019  . Fall   . Elevated CK   .  Elevated troponin   . Palliative care encounter 04/02/2020   Resolved Ambulatory Problems    Diagnosis Date Noted  . Memory loss 07/29/2015   Past Medical History:  Diagnosis Date  . Allergy   . Dementia ([facility])    SOCIAL HX:  Social History   Tobacco Use  . Smoking status: Never Smoker  . Smokeless tobacco: Never Used  Substance Use Topics  . Alcohol use: Yes    Alcohol/week: 14.0 standard drinks    Types: 14 Cans of beer per week   FAMILY HX:  Family History  Problem Relation Age of Onset  . Thyroid disease Sister       ALLERGIES:  Allergies  Allergen Reactions  . Ambien [Zolpidem]     Sleep walking     PERTINENT MEDICATIONS:  Outpatient Encounter Medications as of 12/05/2020  Medication Sig  . Amino Acids-Protein Hydrolys (FEEDING SUPPLEMENT, PRO-STAT SUGAR FREE 64,) LIQD Take 30 mLs by mouth 3 (three) times daily with meals.  Marland Kitchen ascorbic acid (VITAMIN C) 500 MG tablet Take 500 mg by mouth daily.  Marland Kitchen atenolol (TENORMIN) 50 MG tablet TAKE 1 TABLET (50 MG TOTAL) BY MOUTH DAILY.  . ciclopirox (LOPROX) 0.77 % cream Apply 1 application topically daily. To toenails  . fluticasone (FLONASE) 50 MCG/ACT nasal spray SPRAY 2 SPRAY IN EACH NOSTRIL DAILY (Patient taking differently: Place 2 sprays into both nostrils daily. SPRAY 2 SPRAY IN EACH NOSTRIL DAILY)  . melatonin 3 MG TABS tablet Take 3 mg by mouth at bedtime.  . mirtazapine (REMERON) 7.5 MG tablet Take 7.5 mg by mouth at bedtime.  . Multiple Vitamin (MULTIVITAMIN) tablet Take 1 tablet by mouth daily.  . polyethylene glycol (MIRALAX / GLYCOLAX) 17 g packet Take 17 g by mouth daily.  Marland Kitchen senna (SENOKOT) 8.6 MG tablet Take 1 tablet by mouth daily.  Marland Kitchen sulfamethoxazole-trimethoprim (BACTRIM DS) 800-160 MG tablet Take 1 tablet by mouth 2 (two) times daily.  . traMADol (ULTRAM) 50 MG tablet Take 50 mg by mouth every 4 (four) hours as needed for moderate pain.  . traZODone (DESYREL) 150 MG tablet Take 50 mg by mouth at bedtime.    No facility-administered encounter medications on file as of 12/05/2020.    Objective: ROS/staff  General: NAD EYES: denies vision changes, wears glasses at times ENMT: denies dysphagia Cardiovascular: denies chest pain Pulmonary: denies  cough, denies increased SOB Abdomen: endorses fair appetite, endorses occ  constipation, endorses incontinence of bowel GU: denies dysuria, endorses incontinence of urine MSK:  endorses ROM limitations, no falls reported Skin: denies rashes or wounds Neurological: endorses weakness, denies pain, denies insomnia Psych: Endorses positive mood, cobative at times Heme/lymph/immuno: denies bruises, abnormal bleeding  Physical Exam: Current and past weights: 100 lbs Constitutional: NAD General: frail appearing, thin EYES: anicteric sclera, lids intact, no discharge  ENMT: intact hearing,oral mucous membranes moist CV:  no LE edema Pulmonary: no increased work of breathing, no cough, no audible wheezes, room air Abdomen: intake 50-75%,  no ascites GU: deferred MSK: severe sarcopenia, decreased ROM in all extremities, no contractures of LE, non ambulatory Skin: warm and dry, no rashes or wounds on  visible skin Neuro: Generalized weakness, severe cognitive impairment Psych: non-anxious affect today, A and O x 1 Hem/lymph/immuno: no widespread bruising   Thank you for the opportunity to participate in the care of Ms. Raysor.  The palliative care team will continue to follow. Please call our office at [phone] if we can be of additional assistance.  Jason Coop, NP , DNP, MPH, AGPCNP-BC, ACHPN   COVID-19 PATIENT SCREENING TOOL  Person answering questions: _______staff____________   1.  Is the patient or any family member in the home showing any signs or symptoms regarding respiratory infection?                  Person with Symptom  ______________na___________ a. Fever/chills/headache                                                         Yes___ No__X_            b. Shortness of breath                                                            Yes___ No__X_           c. Cough/congestion                                               Yes___  No__X_          d. Muscle/Body aches/pains                                                   Yes___ No__X_         e. Gastrointestinal symptoms (diarrhea,nausea)             Yes___ No__X_         f. Sudden loss of smell or taste      Yes___ No__X_        2. Within the past 10 days, has anyone living in the home had any contact with someone with or under investigation for COVID-19?    Yes___ No__X__   Person __________________

## 2020-12-09 DIAGNOSIS — M62462 Contracture of muscle, left lower leg: Secondary | ICD-10-CM | POA: Diagnosis not present

## 2020-12-09 DIAGNOSIS — M62461 Contracture of muscle, right lower leg: Secondary | ICD-10-CM | POA: Diagnosis not present

## 2020-12-10 DIAGNOSIS — M62462 Contracture of muscle, left lower leg: Secondary | ICD-10-CM | POA: Diagnosis not present

## 2020-12-10 DIAGNOSIS — M62461 Contracture of muscle, right lower leg: Secondary | ICD-10-CM | POA: Diagnosis not present

## 2020-12-12 DIAGNOSIS — M62462 Contracture of muscle, left lower leg: Secondary | ICD-10-CM | POA: Diagnosis not present

## 2020-12-12 DIAGNOSIS — M62461 Contracture of muscle, right lower leg: Secondary | ICD-10-CM | POA: Diagnosis not present

## 2020-12-15 DIAGNOSIS — M62461 Contracture of muscle, right lower leg: Secondary | ICD-10-CM | POA: Diagnosis not present

## 2020-12-15 DIAGNOSIS — M62462 Contracture of muscle, left lower leg: Secondary | ICD-10-CM | POA: Diagnosis not present

## 2020-12-16 DIAGNOSIS — F0151 Vascular dementia with behavioral disturbance: Secondary | ICD-10-CM | POA: Diagnosis not present

## 2020-12-16 DIAGNOSIS — R451 Restlessness and agitation: Secondary | ICD-10-CM | POA: Diagnosis not present

## 2020-12-16 DIAGNOSIS — G4701 Insomnia due to medical condition: Secondary | ICD-10-CM | POA: Diagnosis not present

## 2020-12-16 DIAGNOSIS — R0981 Nasal congestion: Secondary | ICD-10-CM | POA: Diagnosis not present

## 2020-12-16 DIAGNOSIS — R2681 Unsteadiness on feet: Secondary | ICD-10-CM | POA: Diagnosis not present

## 2020-12-16 DIAGNOSIS — M6281 Muscle weakness (generalized): Secondary | ICD-10-CM | POA: Diagnosis not present

## 2020-12-16 DIAGNOSIS — R296 Repeated falls: Secondary | ICD-10-CM | POA: Diagnosis not present

## 2020-12-16 DIAGNOSIS — M62462 Contracture of muscle, left lower leg: Secondary | ICD-10-CM | POA: Diagnosis not present

## 2020-12-16 DIAGNOSIS — E640 Sequelae of protein-calorie malnutrition: Secondary | ICD-10-CM | POA: Diagnosis not present

## 2020-12-16 DIAGNOSIS — I2510 Atherosclerotic heart disease of native coronary artery without angina pectoris: Secondary | ICD-10-CM | POA: Diagnosis not present

## 2020-12-17 DIAGNOSIS — M62462 Contracture of muscle, left lower leg: Secondary | ICD-10-CM | POA: Diagnosis not present

## 2020-12-17 DIAGNOSIS — R2681 Unsteadiness on feet: Secondary | ICD-10-CM | POA: Diagnosis not present

## 2020-12-17 DIAGNOSIS — R0981 Nasal congestion: Secondary | ICD-10-CM | POA: Diagnosis not present

## 2020-12-17 DIAGNOSIS — F0151 Vascular dementia with behavioral disturbance: Secondary | ICD-10-CM | POA: Diagnosis not present

## 2020-12-17 DIAGNOSIS — M6281 Muscle weakness (generalized): Secondary | ICD-10-CM | POA: Diagnosis not present

## 2020-12-17 DIAGNOSIS — I2510 Atherosclerotic heart disease of native coronary artery without angina pectoris: Secondary | ICD-10-CM | POA: Diagnosis not present

## 2020-12-18 DIAGNOSIS — M6281 Muscle weakness (generalized): Secondary | ICD-10-CM | POA: Diagnosis not present

## 2020-12-18 DIAGNOSIS — R0981 Nasal congestion: Secondary | ICD-10-CM | POA: Diagnosis not present

## 2020-12-18 DIAGNOSIS — F0151 Vascular dementia with behavioral disturbance: Secondary | ICD-10-CM | POA: Diagnosis not present

## 2020-12-18 DIAGNOSIS — R2681 Unsteadiness on feet: Secondary | ICD-10-CM | POA: Diagnosis not present

## 2020-12-18 DIAGNOSIS — M62462 Contracture of muscle, left lower leg: Secondary | ICD-10-CM | POA: Diagnosis not present

## 2020-12-18 DIAGNOSIS — I2510 Atherosclerotic heart disease of native coronary artery without angina pectoris: Secondary | ICD-10-CM | POA: Diagnosis not present

## 2020-12-19 DIAGNOSIS — I2510 Atherosclerotic heart disease of native coronary artery without angina pectoris: Secondary | ICD-10-CM | POA: Diagnosis not present

## 2020-12-19 DIAGNOSIS — M6281 Muscle weakness (generalized): Secondary | ICD-10-CM | POA: Diagnosis not present

## 2020-12-19 DIAGNOSIS — F0151 Vascular dementia with behavioral disturbance: Secondary | ICD-10-CM | POA: Diagnosis not present

## 2020-12-19 DIAGNOSIS — R0981 Nasal congestion: Secondary | ICD-10-CM | POA: Diagnosis not present

## 2020-12-19 DIAGNOSIS — M62462 Contracture of muscle, left lower leg: Secondary | ICD-10-CM | POA: Diagnosis not present

## 2020-12-19 DIAGNOSIS — R2681 Unsteadiness on feet: Secondary | ICD-10-CM | POA: Diagnosis not present

## 2020-12-23 DIAGNOSIS — G8929 Other chronic pain: Secondary | ICD-10-CM | POA: Diagnosis not present

## 2020-12-23 DIAGNOSIS — R32 Unspecified urinary incontinence: Secondary | ICD-10-CM | POA: Diagnosis not present

## 2020-12-23 DIAGNOSIS — E43 Unspecified severe protein-calorie malnutrition: Secondary | ICD-10-CM | POA: Diagnosis not present

## 2020-12-23 DIAGNOSIS — I482 Chronic atrial fibrillation, unspecified: Secondary | ICD-10-CM | POA: Diagnosis not present

## 2020-12-23 DIAGNOSIS — J309 Allergic rhinitis, unspecified: Secondary | ICD-10-CM | POA: Diagnosis not present

## 2020-12-23 DIAGNOSIS — F0151 Vascular dementia with behavioral disturbance: Secondary | ICD-10-CM | POA: Diagnosis not present

## 2020-12-23 DIAGNOSIS — M6281 Muscle weakness (generalized): Secondary | ICD-10-CM | POA: Diagnosis not present

## 2020-12-23 DIAGNOSIS — E559 Vitamin D deficiency, unspecified: Secondary | ICD-10-CM | POA: Diagnosis not present

## 2020-12-23 DIAGNOSIS — K5900 Constipation, unspecified: Secondary | ICD-10-CM | POA: Diagnosis not present

## 2020-12-25 DIAGNOSIS — W19XXXA Unspecified fall, initial encounter: Secondary | ICD-10-CM | POA: Diagnosis not present

## 2020-12-25 DIAGNOSIS — F0150 Vascular dementia without behavioral disturbance: Secondary | ICD-10-CM | POA: Diagnosis not present

## 2020-12-25 DIAGNOSIS — R2681 Unsteadiness on feet: Secondary | ICD-10-CM | POA: Diagnosis not present

## 2021-01-16 DIAGNOSIS — N390 Urinary tract infection, site not specified: Secondary | ICD-10-CM | POA: Diagnosis not present

## 2021-01-17 DIAGNOSIS — I4891 Unspecified atrial fibrillation: Secondary | ICD-10-CM | POA: Diagnosis not present

## 2021-01-17 DIAGNOSIS — M6281 Muscle weakness (generalized): Secondary | ICD-10-CM | POA: Diagnosis not present

## 2021-01-17 DIAGNOSIS — G4700 Insomnia, unspecified: Secondary | ICD-10-CM | POA: Diagnosis not present

## 2021-01-19 DIAGNOSIS — M6281 Muscle weakness (generalized): Secondary | ICD-10-CM | POA: Diagnosis not present

## 2021-01-19 DIAGNOSIS — N390 Urinary tract infection, site not specified: Secondary | ICD-10-CM | POA: Diagnosis not present

## 2021-01-19 DIAGNOSIS — F0150 Vascular dementia without behavioral disturbance: Secondary | ICD-10-CM | POA: Diagnosis not present

## 2021-01-19 DIAGNOSIS — G4700 Insomnia, unspecified: Secondary | ICD-10-CM | POA: Diagnosis not present

## 2021-01-19 DIAGNOSIS — J309 Allergic rhinitis, unspecified: Secondary | ICD-10-CM | POA: Diagnosis not present

## 2021-01-20 DIAGNOSIS — E640 Sequelae of protein-calorie malnutrition: Secondary | ICD-10-CM | POA: Diagnosis not present

## 2021-01-20 DIAGNOSIS — R451 Restlessness and agitation: Secondary | ICD-10-CM | POA: Diagnosis not present

## 2021-01-20 DIAGNOSIS — R296 Repeated falls: Secondary | ICD-10-CM | POA: Diagnosis not present

## 2021-01-20 DIAGNOSIS — G4701 Insomnia due to medical condition: Secondary | ICD-10-CM | POA: Diagnosis not present

## 2021-01-20 DIAGNOSIS — F0151 Vascular dementia with behavioral disturbance: Secondary | ICD-10-CM | POA: Diagnosis not present

## 2021-01-23 DIAGNOSIS — I10 Essential (primary) hypertension: Secondary | ICD-10-CM | POA: Diagnosis not present

## 2021-01-23 DIAGNOSIS — E039 Hypothyroidism, unspecified: Secondary | ICD-10-CM | POA: Diagnosis not present

## 2021-01-24 DIAGNOSIS — M6281 Muscle weakness (generalized): Secondary | ICD-10-CM | POA: Diagnosis not present

## 2021-01-24 DIAGNOSIS — N390 Urinary tract infection, site not specified: Secondary | ICD-10-CM | POA: Diagnosis not present

## 2021-01-24 DIAGNOSIS — F0150 Vascular dementia without behavioral disturbance: Secondary | ICD-10-CM | POA: Diagnosis not present

## 2021-01-29 DIAGNOSIS — I10 Essential (primary) hypertension: Secondary | ICD-10-CM | POA: Diagnosis not present

## 2021-01-30 DIAGNOSIS — N390 Urinary tract infection, site not specified: Secondary | ICD-10-CM | POA: Diagnosis not present

## 2021-01-30 DIAGNOSIS — M6281 Muscle weakness (generalized): Secondary | ICD-10-CM | POA: Diagnosis not present

## 2021-01-30 DIAGNOSIS — F0150 Vascular dementia without behavioral disturbance: Secondary | ICD-10-CM | POA: Diagnosis not present

## 2021-02-03 DIAGNOSIS — L602 Onychogryphosis: Secondary | ICD-10-CM | POA: Diagnosis not present

## 2021-02-03 DIAGNOSIS — I739 Peripheral vascular disease, unspecified: Secondary | ICD-10-CM | POA: Diagnosis not present

## 2021-02-12 ENCOUNTER — Non-Acute Institutional Stay: Payer: Medicare HMO | Admitting: Primary Care

## 2021-02-12 ENCOUNTER — Other Ambulatory Visit: Payer: Self-pay

## 2021-02-12 DIAGNOSIS — Z515 Encounter for palliative care: Secondary | ICD-10-CM | POA: Diagnosis not present

## 2021-02-12 DIAGNOSIS — F03C0 Unspecified dementia, severe, without behavioral disturbance, psychotic disturbance, mood disturbance, and anxiety: Secondary | ICD-10-CM

## 2021-02-12 DIAGNOSIS — F0390 Unspecified dementia without behavioral disturbance: Secondary | ICD-10-CM | POA: Diagnosis not present

## 2021-02-12 NOTE — Progress Notes (Signed)
[facility] Consult Note Telephone: [phone]  Fax: [phone]    Date of encounter: 02/12/21 PATIENT NAME: Tara Huffman [address] [place] [address]   [phone] (home)  DOB: 1941-04-28 MRN: [id number] PRIMARY CARE PROVIDER:    Rica Koyanagi, MD,  [address] [place] [address] [phone]  REFERRING PROVIDER:   Rica Koyanagi, MD [address] [place],  [place] [address] [phone]  RESPONSIBLE PARTY:    Contact Information    Name Relation Home Work [place] Daughter [phone]  [phone]   Moore,Carolyn Sister [phone]        I met face to face with patient in facility. Palliative Care was asked to follow this patient by consultation request of  Rica Koyanagi, MD to address advance care planning and complex medical decision making. This is a follow up visit.                                   ASSESSMENT AND PLAN / RECOMMENDATIONS:   Advance Care Planning/Goals of Care: Goals include to maximize quality of life and symptom management.   CODE STATUS: DNR  Symptom Management/Plan:   F/U with patient to assess current regimen. She has been eating 100 % some meals and albumin is now in normal range. She has gained 25 lbs in 4-5 months due to better intake and diet supplements.    She is sitting up at nurses station with some other residents, able to converse some. She asks for money but then does not know what she wants to buy. She is able to smile and make limited appropriate conversation. Staff endorse she is at her baseline for behavior and ability for adls.  Follow up Palliative Care Visit: Palliative care will continue to follow for complex medical decision making, advance care planning, and clarification of goals. Return 12 weeks or prn.  I spent 25 minutes providing this consultation. More than 50% of the time in this consultation was spent in counseling and care  coordination.  PPS: 30%  HOSPICE ELIGIBILITY/DIAGNOSIS: no  Chief Complaint: debility  HISTORY OF PRESENT ILLNESS:  Tara Huffman is a 80 y.o. year old female  with dementia with behavior disturbances, debility, immobility.  History obtained from review of EMR, discussion with primary team, and interview with family, facility staff/caregiver and/or Tara Huffman.  I reviewed available labs, medications, imaging, studies and related documents from the EMR.  Records reviewed and summarized above.   ROS/staff  General: NAD ENMT: denies dysphagia Cardiovascular: denies chest pain, denies DOE Pulmonary: denies cough, denies increased SOB Abdomen: endorses good appetite, denies constipation, endorses incontinence of bowel GU: denies dysuria, endorses incontinence of urine MSK:  Endorses weakness,  no falls reported Skin: denies rashes or wounds Neurological: denies pain, denies insomnia Psych: Endorses positive mood Heme/lymph/immuno: denies bruises, abnormal bleeding  Physical Exam: Current and past weights: 110 lbs, up from 85 lbs from 12/21. Constitutional: NAD General: frail appearing, thin EYES: anicteric sclera, lids intact, no discharge  ENMT: intact hearing, oral mucous membranes moist CV: RRR, no LE edema Pulmonary:  no increased work of breathing, no cough, room air Abdomen: intake 75%, , no ascites MSK: severe  sarcopenia, moves all extremities, non ambulatory Skin: warm and dry, no rashes or wounds on visible skin Neuro:  ++ generalized weakness,  +++ cognitive impairment Psych: non-anxious affect, A and O x  1 Hem/lymph/immuno: no widespread bruising   Thank you for the opportunity to participate in the care of Tara Huffman.  The palliative care team will continue to follow. Please call our office at [phone] if we can be of additional assistance.   Jason Coop, NP , DNP, MPH, AGPCNP-BC, ACHPN  COVID-19 PATIENT SCREENING TOOL Asked and negative  response unless otherwise noted:   Have you had symptoms of covid, tested positive or been in contact with someone with symptoms/positive test in the past 5-10 days?

## 2021-02-13 ENCOUNTER — Non-Acute Institutional Stay: Payer: Medicare HMO | Admitting: Primary Care

## 2021-02-16 DIAGNOSIS — E785 Hyperlipidemia, unspecified: Secondary | ICD-10-CM | POA: Diagnosis not present

## 2021-02-16 DIAGNOSIS — I2510 Atherosclerotic heart disease of native coronary artery without angina pectoris: Secondary | ICD-10-CM | POA: Diagnosis not present

## 2021-02-16 DIAGNOSIS — R627 Adult failure to thrive: Secondary | ICD-10-CM | POA: Diagnosis not present

## 2021-02-16 DIAGNOSIS — R7309 Other abnormal glucose: Secondary | ICD-10-CM | POA: Diagnosis not present

## 2021-02-16 DIAGNOSIS — M6281 Muscle weakness (generalized): Secondary | ICD-10-CM | POA: Diagnosis not present

## 2021-02-16 DIAGNOSIS — R2681 Unsteadiness on feet: Secondary | ICD-10-CM | POA: Diagnosis not present

## 2021-02-16 DIAGNOSIS — F0151 Vascular dementia with behavioral disturbance: Secondary | ICD-10-CM | POA: Diagnosis not present

## 2021-02-16 DIAGNOSIS — I10 Essential (primary) hypertension: Secondary | ICD-10-CM | POA: Diagnosis not present

## 2021-02-16 DIAGNOSIS — F0150 Vascular dementia without behavioral disturbance: Secondary | ICD-10-CM | POA: Diagnosis not present

## 2021-02-16 DIAGNOSIS — R0981 Nasal congestion: Secondary | ICD-10-CM | POA: Diagnosis not present

## 2021-02-18 DIAGNOSIS — J309 Allergic rhinitis, unspecified: Secondary | ICD-10-CM | POA: Diagnosis not present

## 2021-02-18 DIAGNOSIS — F0151 Vascular dementia with behavioral disturbance: Secondary | ICD-10-CM | POA: Diagnosis not present

## 2021-02-18 DIAGNOSIS — G8929 Other chronic pain: Secondary | ICD-10-CM | POA: Diagnosis not present

## 2021-02-18 DIAGNOSIS — K5900 Constipation, unspecified: Secondary | ICD-10-CM | POA: Diagnosis not present

## 2021-02-18 DIAGNOSIS — I482 Chronic atrial fibrillation, unspecified: Secondary | ICD-10-CM | POA: Diagnosis not present

## 2021-02-18 DIAGNOSIS — E559 Vitamin D deficiency, unspecified: Secondary | ICD-10-CM | POA: Diagnosis not present

## 2021-02-18 DIAGNOSIS — G4700 Insomnia, unspecified: Secondary | ICD-10-CM | POA: Diagnosis not present

## 2021-02-18 DIAGNOSIS — E43 Unspecified severe protein-calorie malnutrition: Secondary | ICD-10-CM | POA: Diagnosis not present

## 2021-02-20 DIAGNOSIS — R0981 Nasal congestion: Secondary | ICD-10-CM | POA: Diagnosis not present

## 2021-02-20 DIAGNOSIS — I2510 Atherosclerotic heart disease of native coronary artery without angina pectoris: Secondary | ICD-10-CM | POA: Diagnosis not present

## 2021-02-20 DIAGNOSIS — I10 Essential (primary) hypertension: Secondary | ICD-10-CM | POA: Diagnosis not present

## 2021-02-20 DIAGNOSIS — M6281 Muscle weakness (generalized): Secondary | ICD-10-CM | POA: Diagnosis not present

## 2021-02-20 DIAGNOSIS — R2681 Unsteadiness on feet: Secondary | ICD-10-CM | POA: Diagnosis not present

## 2021-02-20 DIAGNOSIS — F0151 Vascular dementia with behavioral disturbance: Secondary | ICD-10-CM | POA: Diagnosis not present

## 2021-02-20 DIAGNOSIS — F0150 Vascular dementia without behavioral disturbance: Secondary | ICD-10-CM | POA: Diagnosis not present

## 2021-02-23 DIAGNOSIS — I10 Essential (primary) hypertension: Secondary | ICD-10-CM | POA: Diagnosis not present

## 2021-02-23 DIAGNOSIS — I2510 Atherosclerotic heart disease of native coronary artery without angina pectoris: Secondary | ICD-10-CM | POA: Diagnosis not present

## 2021-02-23 DIAGNOSIS — F0151 Vascular dementia with behavioral disturbance: Secondary | ICD-10-CM | POA: Diagnosis not present

## 2021-02-23 DIAGNOSIS — R2681 Unsteadiness on feet: Secondary | ICD-10-CM | POA: Diagnosis not present

## 2021-02-23 DIAGNOSIS — M6281 Muscle weakness (generalized): Secondary | ICD-10-CM | POA: Diagnosis not present

## 2021-02-23 DIAGNOSIS — F0150 Vascular dementia without behavioral disturbance: Secondary | ICD-10-CM | POA: Diagnosis not present

## 2021-02-23 DIAGNOSIS — R0981 Nasal congestion: Secondary | ICD-10-CM | POA: Diagnosis not present

## 2021-02-24 DIAGNOSIS — I10 Essential (primary) hypertension: Secondary | ICD-10-CM | POA: Diagnosis not present

## 2021-02-24 DIAGNOSIS — M6281 Muscle weakness (generalized): Secondary | ICD-10-CM | POA: Diagnosis not present

## 2021-02-24 DIAGNOSIS — F0151 Vascular dementia with behavioral disturbance: Secondary | ICD-10-CM | POA: Diagnosis not present

## 2021-02-24 DIAGNOSIS — R2681 Unsteadiness on feet: Secondary | ICD-10-CM | POA: Diagnosis not present

## 2021-02-24 DIAGNOSIS — R0981 Nasal congestion: Secondary | ICD-10-CM | POA: Diagnosis not present

## 2021-02-24 DIAGNOSIS — I2510 Atherosclerotic heart disease of native coronary artery without angina pectoris: Secondary | ICD-10-CM | POA: Diagnosis not present

## 2021-02-24 DIAGNOSIS — F0150 Vascular dementia without behavioral disturbance: Secondary | ICD-10-CM | POA: Diagnosis not present

## 2021-02-25 DIAGNOSIS — F22 Delusional disorders: Secondary | ICD-10-CM | POA: Diagnosis not present

## 2021-02-25 DIAGNOSIS — G4700 Insomnia, unspecified: Secondary | ICD-10-CM | POA: Diagnosis not present

## 2021-02-25 DIAGNOSIS — R0981 Nasal congestion: Secondary | ICD-10-CM | POA: Diagnosis not present

## 2021-02-25 DIAGNOSIS — F0151 Vascular dementia with behavioral disturbance: Secondary | ICD-10-CM | POA: Diagnosis not present

## 2021-02-25 DIAGNOSIS — R2681 Unsteadiness on feet: Secondary | ICD-10-CM | POA: Diagnosis not present

## 2021-02-25 DIAGNOSIS — E43 Unspecified severe protein-calorie malnutrition: Secondary | ICD-10-CM | POA: Diagnosis not present

## 2021-02-25 DIAGNOSIS — M79672 Pain in left foot: Secondary | ICD-10-CM | POA: Diagnosis not present

## 2021-02-25 DIAGNOSIS — E640 Sequelae of protein-calorie malnutrition: Secondary | ICD-10-CM | POA: Diagnosis not present

## 2021-02-25 DIAGNOSIS — F0150 Vascular dementia without behavioral disturbance: Secondary | ICD-10-CM | POA: Diagnosis not present

## 2021-02-25 DIAGNOSIS — I2510 Atherosclerotic heart disease of native coronary artery without angina pectoris: Secondary | ICD-10-CM | POA: Diagnosis not present

## 2021-02-25 DIAGNOSIS — R451 Restlessness and agitation: Secondary | ICD-10-CM | POA: Diagnosis not present

## 2021-02-25 DIAGNOSIS — I10 Essential (primary) hypertension: Secondary | ICD-10-CM | POA: Diagnosis not present

## 2021-02-25 DIAGNOSIS — M6281 Muscle weakness (generalized): Secondary | ICD-10-CM | POA: Diagnosis not present

## 2021-02-26 DIAGNOSIS — F0151 Vascular dementia with behavioral disturbance: Secondary | ICD-10-CM | POA: Diagnosis not present

## 2021-02-26 DIAGNOSIS — F0150 Vascular dementia without behavioral disturbance: Secondary | ICD-10-CM | POA: Diagnosis not present

## 2021-02-26 DIAGNOSIS — R0981 Nasal congestion: Secondary | ICD-10-CM | POA: Diagnosis not present

## 2021-02-26 DIAGNOSIS — M6281 Muscle weakness (generalized): Secondary | ICD-10-CM | POA: Diagnosis not present

## 2021-02-26 DIAGNOSIS — I10 Essential (primary) hypertension: Secondary | ICD-10-CM | POA: Diagnosis not present

## 2021-02-26 DIAGNOSIS — I2510 Atherosclerotic heart disease of native coronary artery without angina pectoris: Secondary | ICD-10-CM | POA: Diagnosis not present

## 2021-02-26 DIAGNOSIS — R2681 Unsteadiness on feet: Secondary | ICD-10-CM | POA: Diagnosis not present

## 2021-02-27 DIAGNOSIS — F0151 Vascular dementia with behavioral disturbance: Secondary | ICD-10-CM | POA: Diagnosis not present

## 2021-02-27 DIAGNOSIS — I2510 Atherosclerotic heart disease of native coronary artery without angina pectoris: Secondary | ICD-10-CM | POA: Diagnosis not present

## 2021-02-27 DIAGNOSIS — I10 Essential (primary) hypertension: Secondary | ICD-10-CM | POA: Diagnosis not present

## 2021-02-27 DIAGNOSIS — M6281 Muscle weakness (generalized): Secondary | ICD-10-CM | POA: Diagnosis not present

## 2021-02-27 DIAGNOSIS — F0150 Vascular dementia without behavioral disturbance: Secondary | ICD-10-CM | POA: Diagnosis not present

## 2021-02-27 DIAGNOSIS — R2681 Unsteadiness on feet: Secondary | ICD-10-CM | POA: Diagnosis not present

## 2021-02-27 DIAGNOSIS — R0981 Nasal congestion: Secondary | ICD-10-CM | POA: Diagnosis not present

## 2021-03-02 DIAGNOSIS — I10 Essential (primary) hypertension: Secondary | ICD-10-CM | POA: Diagnosis not present

## 2021-03-02 DIAGNOSIS — F0151 Vascular dementia with behavioral disturbance: Secondary | ICD-10-CM | POA: Diagnosis not present

## 2021-03-02 DIAGNOSIS — R2681 Unsteadiness on feet: Secondary | ICD-10-CM | POA: Diagnosis not present

## 2021-03-02 DIAGNOSIS — R0981 Nasal congestion: Secondary | ICD-10-CM | POA: Diagnosis not present

## 2021-03-02 DIAGNOSIS — M6281 Muscle weakness (generalized): Secondary | ICD-10-CM | POA: Diagnosis not present

## 2021-03-02 DIAGNOSIS — F0150 Vascular dementia without behavioral disturbance: Secondary | ICD-10-CM | POA: Diagnosis not present

## 2021-03-02 DIAGNOSIS — I2510 Atherosclerotic heart disease of native coronary artery without angina pectoris: Secondary | ICD-10-CM | POA: Diagnosis not present

## 2021-03-03 DIAGNOSIS — I2510 Atherosclerotic heart disease of native coronary artery without angina pectoris: Secondary | ICD-10-CM | POA: Diagnosis not present

## 2021-03-03 DIAGNOSIS — M6281 Muscle weakness (generalized): Secondary | ICD-10-CM | POA: Diagnosis not present

## 2021-03-03 DIAGNOSIS — F0150 Vascular dementia without behavioral disturbance: Secondary | ICD-10-CM | POA: Diagnosis not present

## 2021-03-03 DIAGNOSIS — R0981 Nasal congestion: Secondary | ICD-10-CM | POA: Diagnosis not present

## 2021-03-03 DIAGNOSIS — F0151 Vascular dementia with behavioral disturbance: Secondary | ICD-10-CM | POA: Diagnosis not present

## 2021-03-03 DIAGNOSIS — I10 Essential (primary) hypertension: Secondary | ICD-10-CM | POA: Diagnosis not present

## 2021-03-03 DIAGNOSIS — R2681 Unsteadiness on feet: Secondary | ICD-10-CM | POA: Diagnosis not present

## 2021-03-05 DIAGNOSIS — I10 Essential (primary) hypertension: Secondary | ICD-10-CM | POA: Diagnosis not present

## 2021-03-05 DIAGNOSIS — F0151 Vascular dementia with behavioral disturbance: Secondary | ICD-10-CM | POA: Diagnosis not present

## 2021-03-05 DIAGNOSIS — F0150 Vascular dementia without behavioral disturbance: Secondary | ICD-10-CM | POA: Diagnosis not present

## 2021-03-05 DIAGNOSIS — M6281 Muscle weakness (generalized): Secondary | ICD-10-CM | POA: Diagnosis not present

## 2021-03-05 DIAGNOSIS — R0981 Nasal congestion: Secondary | ICD-10-CM | POA: Diagnosis not present

## 2021-03-05 DIAGNOSIS — I2510 Atherosclerotic heart disease of native coronary artery without angina pectoris: Secondary | ICD-10-CM | POA: Diagnosis not present

## 2021-03-05 DIAGNOSIS — R2681 Unsteadiness on feet: Secondary | ICD-10-CM | POA: Diagnosis not present

## 2021-03-06 DIAGNOSIS — I2510 Atherosclerotic heart disease of native coronary artery without angina pectoris: Secondary | ICD-10-CM | POA: Diagnosis not present

## 2021-03-06 DIAGNOSIS — I10 Essential (primary) hypertension: Secondary | ICD-10-CM | POA: Diagnosis not present

## 2021-03-06 DIAGNOSIS — F0151 Vascular dementia with behavioral disturbance: Secondary | ICD-10-CM | POA: Diagnosis not present

## 2021-03-06 DIAGNOSIS — R2681 Unsteadiness on feet: Secondary | ICD-10-CM | POA: Diagnosis not present

## 2021-03-06 DIAGNOSIS — F0150 Vascular dementia without behavioral disturbance: Secondary | ICD-10-CM | POA: Diagnosis not present

## 2021-03-06 DIAGNOSIS — M6281 Muscle weakness (generalized): Secondary | ICD-10-CM | POA: Diagnosis not present

## 2021-03-06 DIAGNOSIS — R0981 Nasal congestion: Secondary | ICD-10-CM | POA: Diagnosis not present

## 2021-03-09 DIAGNOSIS — I10 Essential (primary) hypertension: Secondary | ICD-10-CM | POA: Diagnosis not present

## 2021-03-09 DIAGNOSIS — I2510 Atherosclerotic heart disease of native coronary artery without angina pectoris: Secondary | ICD-10-CM | POA: Diagnosis not present

## 2021-03-09 DIAGNOSIS — R2681 Unsteadiness on feet: Secondary | ICD-10-CM | POA: Diagnosis not present

## 2021-03-09 DIAGNOSIS — M6281 Muscle weakness (generalized): Secondary | ICD-10-CM | POA: Diagnosis not present

## 2021-03-09 DIAGNOSIS — R0981 Nasal congestion: Secondary | ICD-10-CM | POA: Diagnosis not present

## 2021-03-09 DIAGNOSIS — F0150 Vascular dementia without behavioral disturbance: Secondary | ICD-10-CM | POA: Diagnosis not present

## 2021-03-09 DIAGNOSIS — F0151 Vascular dementia with behavioral disturbance: Secondary | ICD-10-CM | POA: Diagnosis not present

## 2021-03-10 DIAGNOSIS — I10 Essential (primary) hypertension: Secondary | ICD-10-CM | POA: Diagnosis not present

## 2021-03-10 DIAGNOSIS — F0150 Vascular dementia without behavioral disturbance: Secondary | ICD-10-CM | POA: Diagnosis not present

## 2021-03-10 DIAGNOSIS — I2510 Atherosclerotic heart disease of native coronary artery without angina pectoris: Secondary | ICD-10-CM | POA: Diagnosis not present

## 2021-03-10 DIAGNOSIS — R2681 Unsteadiness on feet: Secondary | ICD-10-CM | POA: Diagnosis not present

## 2021-03-10 DIAGNOSIS — M6281 Muscle weakness (generalized): Secondary | ICD-10-CM | POA: Diagnosis not present

## 2021-03-10 DIAGNOSIS — R0981 Nasal congestion: Secondary | ICD-10-CM | POA: Diagnosis not present

## 2021-03-10 DIAGNOSIS — F0151 Vascular dementia with behavioral disturbance: Secondary | ICD-10-CM | POA: Diagnosis not present

## 2021-03-11 DIAGNOSIS — I2510 Atherosclerotic heart disease of native coronary artery without angina pectoris: Secondary | ICD-10-CM | POA: Diagnosis not present

## 2021-03-11 DIAGNOSIS — M6281 Muscle weakness (generalized): Secondary | ICD-10-CM | POA: Diagnosis not present

## 2021-03-11 DIAGNOSIS — R2681 Unsteadiness on feet: Secondary | ICD-10-CM | POA: Diagnosis not present

## 2021-03-11 DIAGNOSIS — R0981 Nasal congestion: Secondary | ICD-10-CM | POA: Diagnosis not present

## 2021-03-11 DIAGNOSIS — F0150 Vascular dementia without behavioral disturbance: Secondary | ICD-10-CM | POA: Diagnosis not present

## 2021-03-11 DIAGNOSIS — F0151 Vascular dementia with behavioral disturbance: Secondary | ICD-10-CM | POA: Diagnosis not present

## 2021-03-11 DIAGNOSIS — I10 Essential (primary) hypertension: Secondary | ICD-10-CM | POA: Diagnosis not present

## 2021-03-12 DIAGNOSIS — I2510 Atherosclerotic heart disease of native coronary artery without angina pectoris: Secondary | ICD-10-CM | POA: Diagnosis not present

## 2021-03-12 DIAGNOSIS — F0151 Vascular dementia with behavioral disturbance: Secondary | ICD-10-CM | POA: Diagnosis not present

## 2021-03-12 DIAGNOSIS — R0981 Nasal congestion: Secondary | ICD-10-CM | POA: Diagnosis not present

## 2021-03-12 DIAGNOSIS — M6281 Muscle weakness (generalized): Secondary | ICD-10-CM | POA: Diagnosis not present

## 2021-03-12 DIAGNOSIS — F0150 Vascular dementia without behavioral disturbance: Secondary | ICD-10-CM | POA: Diagnosis not present

## 2021-03-12 DIAGNOSIS — I10 Essential (primary) hypertension: Secondary | ICD-10-CM | POA: Diagnosis not present

## 2021-03-12 DIAGNOSIS — R2681 Unsteadiness on feet: Secondary | ICD-10-CM | POA: Diagnosis not present

## 2021-03-17 DIAGNOSIS — R296 Repeated falls: Secondary | ICD-10-CM | POA: Diagnosis not present

## 2021-03-17 DIAGNOSIS — F0151 Vascular dementia with behavioral disturbance: Secondary | ICD-10-CM | POA: Diagnosis not present

## 2021-03-17 DIAGNOSIS — W19XXXA Unspecified fall, initial encounter: Secondary | ICD-10-CM | POA: Diagnosis not present

## 2021-03-17 DIAGNOSIS — M6281 Muscle weakness (generalized): Secondary | ICD-10-CM | POA: Diagnosis not present

## 2021-03-18 DIAGNOSIS — N390 Urinary tract infection, site not specified: Secondary | ICD-10-CM | POA: Diagnosis not present

## 2021-03-19 DIAGNOSIS — N390 Urinary tract infection, site not specified: Secondary | ICD-10-CM | POA: Diagnosis not present

## 2021-03-23 DIAGNOSIS — F0151 Vascular dementia with behavioral disturbance: Secondary | ICD-10-CM | POA: Diagnosis not present

## 2021-03-23 DIAGNOSIS — N390 Urinary tract infection, site not specified: Secondary | ICD-10-CM | POA: Diagnosis not present

## 2021-03-24 DIAGNOSIS — F0151 Vascular dementia with behavioral disturbance: Secondary | ICD-10-CM | POA: Diagnosis not present

## 2021-03-24 DIAGNOSIS — U071 COVID-19: Secondary | ICD-10-CM | POA: Diagnosis not present

## 2021-04-08 DIAGNOSIS — N390 Urinary tract infection, site not specified: Secondary | ICD-10-CM | POA: Diagnosis not present

## 2021-04-08 DIAGNOSIS — F0151 Vascular dementia with behavioral disturbance: Secondary | ICD-10-CM | POA: Diagnosis not present

## 2021-04-10 DIAGNOSIS — N390 Urinary tract infection, site not specified: Secondary | ICD-10-CM | POA: Diagnosis not present

## 2021-04-22 DIAGNOSIS — F0150 Vascular dementia without behavioral disturbance: Secondary | ICD-10-CM | POA: Diagnosis not present

## 2021-04-22 DIAGNOSIS — I482 Chronic atrial fibrillation, unspecified: Secondary | ICD-10-CM | POA: Diagnosis not present

## 2021-04-22 DIAGNOSIS — R451 Restlessness and agitation: Secondary | ICD-10-CM | POA: Diagnosis not present

## 2021-05-05 DIAGNOSIS — F0151 Vascular dementia with behavioral disturbance: Secondary | ICD-10-CM | POA: Diagnosis not present

## 2021-05-05 DIAGNOSIS — G8929 Other chronic pain: Secondary | ICD-10-CM | POA: Diagnosis not present

## 2021-05-05 DIAGNOSIS — G4700 Insomnia, unspecified: Secondary | ICD-10-CM | POA: Diagnosis not present

## 2021-05-05 DIAGNOSIS — R296 Repeated falls: Secondary | ICD-10-CM | POA: Diagnosis not present

## 2021-05-05 DIAGNOSIS — N390 Urinary tract infection, site not specified: Secondary | ICD-10-CM | POA: Diagnosis not present

## 2021-05-05 DIAGNOSIS — E43 Unspecified severe protein-calorie malnutrition: Secondary | ICD-10-CM | POA: Diagnosis not present

## 2021-05-05 DIAGNOSIS — U071 COVID-19: Secondary | ICD-10-CM | POA: Diagnosis not present

## 2021-05-05 DIAGNOSIS — I482 Chronic atrial fibrillation, unspecified: Secondary | ICD-10-CM | POA: Diagnosis not present

## 2021-05-07 DIAGNOSIS — R296 Repeated falls: Secondary | ICD-10-CM | POA: Diagnosis not present

## 2021-05-07 DIAGNOSIS — F0151 Vascular dementia with behavioral disturbance: Secondary | ICD-10-CM | POA: Diagnosis not present

## 2021-05-14 DIAGNOSIS — R0981 Nasal congestion: Secondary | ICD-10-CM | POA: Diagnosis not present

## 2021-05-14 DIAGNOSIS — M6281 Muscle weakness (generalized): Secondary | ICD-10-CM | POA: Diagnosis not present

## 2021-05-14 DIAGNOSIS — I2510 Atherosclerotic heart disease of native coronary artery without angina pectoris: Secondary | ICD-10-CM | POA: Diagnosis not present

## 2021-05-14 DIAGNOSIS — F0150 Vascular dementia without behavioral disturbance: Secondary | ICD-10-CM | POA: Diagnosis not present

## 2021-05-14 DIAGNOSIS — R2681 Unsteadiness on feet: Secondary | ICD-10-CM | POA: Diagnosis not present

## 2021-05-14 DIAGNOSIS — I4891 Unspecified atrial fibrillation: Secondary | ICD-10-CM | POA: Diagnosis not present

## 2021-05-14 DIAGNOSIS — I10 Essential (primary) hypertension: Secondary | ICD-10-CM | POA: Diagnosis not present

## 2021-05-15 DIAGNOSIS — I2510 Atherosclerotic heart disease of native coronary artery without angina pectoris: Secondary | ICD-10-CM | POA: Diagnosis not present

## 2021-05-15 DIAGNOSIS — R0981 Nasal congestion: Secondary | ICD-10-CM | POA: Diagnosis not present

## 2021-05-15 DIAGNOSIS — I10 Essential (primary) hypertension: Secondary | ICD-10-CM | POA: Diagnosis not present

## 2021-05-15 DIAGNOSIS — F0150 Vascular dementia without behavioral disturbance: Secondary | ICD-10-CM | POA: Diagnosis not present

## 2021-05-15 DIAGNOSIS — R2681 Unsteadiness on feet: Secondary | ICD-10-CM | POA: Diagnosis not present

## 2021-05-15 DIAGNOSIS — I4891 Unspecified atrial fibrillation: Secondary | ICD-10-CM | POA: Diagnosis not present

## 2021-05-15 DIAGNOSIS — M6281 Muscle weakness (generalized): Secondary | ICD-10-CM | POA: Diagnosis not present

## 2021-05-18 DIAGNOSIS — R0981 Nasal congestion: Secondary | ICD-10-CM | POA: Diagnosis not present

## 2021-05-18 DIAGNOSIS — R2681 Unsteadiness on feet: Secondary | ICD-10-CM | POA: Diagnosis not present

## 2021-05-18 DIAGNOSIS — I2510 Atherosclerotic heart disease of native coronary artery without angina pectoris: Secondary | ICD-10-CM | POA: Diagnosis not present

## 2021-05-18 DIAGNOSIS — F0150 Vascular dementia without behavioral disturbance: Secondary | ICD-10-CM | POA: Diagnosis not present

## 2021-05-18 DIAGNOSIS — Z8616 Personal history of COVID-19: Secondary | ICD-10-CM | POA: Diagnosis not present

## 2021-05-18 DIAGNOSIS — M6281 Muscle weakness (generalized): Secondary | ICD-10-CM | POA: Diagnosis not present

## 2021-05-18 DIAGNOSIS — I10 Essential (primary) hypertension: Secondary | ICD-10-CM | POA: Diagnosis not present

## 2021-05-19 DIAGNOSIS — R2681 Unsteadiness on feet: Secondary | ICD-10-CM | POA: Diagnosis not present

## 2021-05-19 DIAGNOSIS — F0150 Vascular dementia without behavioral disturbance: Secondary | ICD-10-CM | POA: Diagnosis not present

## 2021-05-19 DIAGNOSIS — I2510 Atherosclerotic heart disease of native coronary artery without angina pectoris: Secondary | ICD-10-CM | POA: Diagnosis not present

## 2021-05-19 DIAGNOSIS — I10 Essential (primary) hypertension: Secondary | ICD-10-CM | POA: Diagnosis not present

## 2021-05-19 DIAGNOSIS — M6281 Muscle weakness (generalized): Secondary | ICD-10-CM | POA: Diagnosis not present

## 2021-05-19 DIAGNOSIS — Z8616 Personal history of COVID-19: Secondary | ICD-10-CM | POA: Diagnosis not present

## 2021-05-19 DIAGNOSIS — R0981 Nasal congestion: Secondary | ICD-10-CM | POA: Diagnosis not present

## 2021-05-20 ENCOUNTER — Other Ambulatory Visit: Payer: Self-pay

## 2021-05-20 ENCOUNTER — Non-Acute Institutional Stay: Payer: Medicare HMO | Admitting: Primary Care

## 2021-05-20 DIAGNOSIS — Z8616 Personal history of COVID-19: Secondary | ICD-10-CM | POA: Diagnosis not present

## 2021-05-20 DIAGNOSIS — F0150 Vascular dementia without behavioral disturbance: Secondary | ICD-10-CM | POA: Diagnosis not present

## 2021-05-20 DIAGNOSIS — Z515 Encounter for palliative care: Secondary | ICD-10-CM

## 2021-05-20 DIAGNOSIS — F0390 Unspecified dementia without behavioral disturbance: Secondary | ICD-10-CM | POA: Diagnosis not present

## 2021-05-20 DIAGNOSIS — I10 Essential (primary) hypertension: Secondary | ICD-10-CM | POA: Diagnosis not present

## 2021-05-20 DIAGNOSIS — I2510 Atherosclerotic heart disease of native coronary artery without angina pectoris: Secondary | ICD-10-CM | POA: Diagnosis not present

## 2021-05-20 DIAGNOSIS — F03C0 Unspecified dementia, severe, without behavioral disturbance, psychotic disturbance, mood disturbance, and anxiety: Secondary | ICD-10-CM

## 2021-05-20 DIAGNOSIS — R0981 Nasal congestion: Secondary | ICD-10-CM | POA: Diagnosis not present

## 2021-05-20 DIAGNOSIS — R2681 Unsteadiness on feet: Secondary | ICD-10-CM | POA: Diagnosis not present

## 2021-05-20 DIAGNOSIS — M6281 Muscle weakness (generalized): Secondary | ICD-10-CM | POA: Diagnosis not present

## 2021-05-20 NOTE — Progress Notes (Signed)
[facility] Consult Note Telephone: [phone]  Fax: [phone]    Date of encounter: 05/20/21 PATIENT NAME: Tara Huffman [address] [place] [address]   [phone] (home)  DOB: Sep 26, 1941 MRN: [id number] PRIMARY CARE PROVIDER:    Rica Koyanagi, MD,  [place] [address] [phone]  REFERRING PROVIDER:   Rica Koyanagi, MD [address] [place],  [place] [address] [phone]  RESPONSIBLE PARTY:    Contact Information     Name Relation Home Work [place] Daughter [phone]  [phone]   Huffman,Tara Sister [phone]          [facility] met [facility]. Palliative Care was asked to follow this patient by consultation request of  Rica Koyanagi, MD to address advance care planning and complex medical decision making. This is a follow up visit.                                   ASSESSMENT AND PLAN / RECOMMENDATIONS:   Advance Care Planning/Goals of Care: Goals include to maximize quality of life and symptom management.  CODE STATUS: DNR, comfort measures  Symptom Management/Plan:  Patient is up in chair and has been locomoting in w/c in halls. Today is her 80th birthday and she is enjoying gifts and flowers. Staff is congratulating her and she appears pleased and calm today. Weight is stable at 109 lbs, and has fluctuated up a bit but not much below 109 lbs. Dementia/behaviors are stable without disturbances of late noted. Medications reviewed; She appears happy and relaxed, and I recommend maintaining current regimen.  Follow up Palliative Care Visit: Palliative care will continue to follow for complex medical decision making, advance care planning, and clarification of goals. Return 8-12 weeks or prn.  I spent 25 minutes providing this consultation. More than 50% of the time in this consultation was spent in counseling and care coordination.  PPS:  40%  HOSPICE ELIGIBILITY/DIAGNOSIS: TBD  Chief Complaint: dementia  HISTORY OF PRESENT ILLNESS:  Tara Huffman is a 80 y.o. year old female  with dementia with h/o behavior disturbances, debility, immobility. .   History obtained from review of EMR, discussion with primary team, and interview with family, facility staff/caregiver and/or Ms. Tara Huffman.  I reviewed available labs, medications, imaging, studies and related documents from the EMR.  Records reviewed and summarized above.   ROS/staff report  General: NAD ENMT: denies dysphagia Cardiovascular: denies DOE Pulmonary: denies cough, denies increased SOB Abdomen: endorses good appetite, denies constipation, endorses incontinence of bowel GU: denies dysuria, endorses incontinence of urine MSK:  denies new weakness,  no falls reported Skin: denies rashes or wounds Neurological: denies pain, denies insomnia Psych: Endorses positive mood Heme/lymph/immuno: denies bruises, abnormal bleeding  Physical Exam: Current and past weights: 109 lbs Constitutional: NAD General: frail appearing, thin EYES: anicteric sclera, lids intact, no discharge  ENMT: intact hearing, oral mucous membranes moist CV: no LE edema Pulmonary: no increased work of breathing, no cough, room air Abdomen: intake 50-75%,  no ascites GU: deferred MSK: ++ sarcopenia, moves all extremities, non ambulatory Skin: warm and dry, no rashes or wounds on visible skin Neuro:  moderate generalized weakness,  severe cognitive impairment Psych: non-anxious affect, A and O x 1 Hem/lymph/immuno: no widespread bruising  Outpatient Encounter Medications as of 05/20/2021  Medication Sig   acetaminophen (  TYLENOL) 500 MG tablet Take 1,000 mg by mouth in the morning, at noon, and at bedtime.   atenolol (TENORMIN) 50 MG tablet TAKE 1 TABLET (50 MG TOTAL) BY MOUTH DAILY.   Cholecalciferol (VITAMIN D-3) 125 MCG (5000 UT) TABS Take 1 tablet by mouth daily.   diclofenac Sodium  (VOLTAREN) 1 % GEL Apply 1 application topically 4 (four) times daily as needed.   fluticasone (FLONASE) 50 MCG/ACT nasal spray SPRAY 2 SPRAY IN EACH NOSTRIL DAILY (Patient taking differently: Place 2 sprays into both nostrils daily. SPRAY 2 SPRAY IN EACH NOSTRIL DAILY)   LORazepam (ATIVAN) 0.5 MG tablet Take 1 tablet by mouth 2 (two) times daily as needed for anxiety.   melatonin 3 MG TABS tablet Take 3 mg by mouth at bedtime.   Multiple Vitamin (MULTIVITAMIN) tablet Take 1 tablet by mouth daily.   polyethylene glycol (MIRALAX / GLYCOLAX) 17 g packet Take 17 g by mouth daily.   risperiDONE (RISPERDAL) 1 MG tablet Take 1 mg by mouth 2 (two) times daily.   senna (SENOKOT) 8.6 MG tablet Take 2 tablets by mouth 2 (two) times daily.   traMADol (ULTRAM) 50 MG tablet Take 50 mg by mouth every 4 (four) hours as needed for moderate pain.   traMADol (ULTRAM) 50 MG tablet Take 50 mg by mouth in the morning, at noon, and at bedtime.   traZODone (DESYREL) 50 MG tablet Take 50 mg by mouth at bedtime.   [DISCONTINUED] traZODone (DESYREL) 150 MG tablet Take 50 mg by mouth at bedtime.   [DISCONTINUED] Amino Acids-Protein Hydrolys (FEEDING SUPPLEMENT, PRO-STAT SUGAR FREE 64,) LIQD Take 30 mLs by mouth 3 (three) times daily with meals.   [DISCONTINUED] ascorbic acid (VITAMIN C) 500 MG tablet Take 500 mg by mouth daily.   [DISCONTINUED] ciclopirox (LOPROX) 0.77 % cream Apply 1 application topically daily. To toenails   [DISCONTINUED] mirtazapine (REMERON) 7.5 MG tablet Take 7.5 mg by mouth at bedtime.   [DISCONTINUED] sulfamethoxazole-trimethoprim (BACTRIM DS) 800-160 MG tablet Take 1 tablet by mouth 2 (two) times daily.   No facility-administered encounter medications on file as of 05/20/2021.    Thank you for the opportunity to participate in the care of Ms. Tara Huffman.  The palliative care team will continue to follow. Please call our office at [phone] if we can be of additional assistance.   Jason Coop, NP   COVID-19 PATIENT SCREENING TOOL Asked and negative response unless otherwise noted:   Have you had symptoms of covid, tested positive or been in contact with someone with symptoms/positive test in the past 5-10 days?

## 2021-05-21 DIAGNOSIS — I2510 Atherosclerotic heart disease of native coronary artery without angina pectoris: Secondary | ICD-10-CM | POA: Diagnosis not present

## 2021-05-21 DIAGNOSIS — R0981 Nasal congestion: Secondary | ICD-10-CM | POA: Diagnosis not present

## 2021-05-21 DIAGNOSIS — M6281 Muscle weakness (generalized): Secondary | ICD-10-CM | POA: Diagnosis not present

## 2021-05-21 DIAGNOSIS — I10 Essential (primary) hypertension: Secondary | ICD-10-CM | POA: Diagnosis not present

## 2021-05-21 DIAGNOSIS — R2681 Unsteadiness on feet: Secondary | ICD-10-CM | POA: Diagnosis not present

## 2021-05-21 DIAGNOSIS — F0150 Vascular dementia without behavioral disturbance: Secondary | ICD-10-CM | POA: Diagnosis not present

## 2021-05-21 DIAGNOSIS — Z8616 Personal history of COVID-19: Secondary | ICD-10-CM | POA: Diagnosis not present

## 2021-05-22 DIAGNOSIS — Z8616 Personal history of COVID-19: Secondary | ICD-10-CM | POA: Diagnosis not present

## 2021-05-22 DIAGNOSIS — I2510 Atherosclerotic heart disease of native coronary artery without angina pectoris: Secondary | ICD-10-CM | POA: Diagnosis not present

## 2021-05-22 DIAGNOSIS — I10 Essential (primary) hypertension: Secondary | ICD-10-CM | POA: Diagnosis not present

## 2021-05-22 DIAGNOSIS — R0981 Nasal congestion: Secondary | ICD-10-CM | POA: Diagnosis not present

## 2021-05-22 DIAGNOSIS — F0150 Vascular dementia without behavioral disturbance: Secondary | ICD-10-CM | POA: Diagnosis not present

## 2021-05-22 DIAGNOSIS — R2681 Unsteadiness on feet: Secondary | ICD-10-CM | POA: Diagnosis not present

## 2021-05-22 DIAGNOSIS — M6281 Muscle weakness (generalized): Secondary | ICD-10-CM | POA: Diagnosis not present

## 2021-05-23 DIAGNOSIS — E559 Vitamin D deficiency, unspecified: Secondary | ICD-10-CM | POA: Diagnosis not present

## 2021-05-25 DIAGNOSIS — I10 Essential (primary) hypertension: Secondary | ICD-10-CM | POA: Diagnosis not present

## 2021-05-25 DIAGNOSIS — F0150 Vascular dementia without behavioral disturbance: Secondary | ICD-10-CM | POA: Diagnosis not present

## 2021-05-25 DIAGNOSIS — I2510 Atherosclerotic heart disease of native coronary artery without angina pectoris: Secondary | ICD-10-CM | POA: Diagnosis not present

## 2021-05-25 DIAGNOSIS — M6281 Muscle weakness (generalized): Secondary | ICD-10-CM | POA: Diagnosis not present

## 2021-05-25 DIAGNOSIS — Z8616 Personal history of COVID-19: Secondary | ICD-10-CM | POA: Diagnosis not present

## 2021-05-25 DIAGNOSIS — R0981 Nasal congestion: Secondary | ICD-10-CM | POA: Diagnosis not present

## 2021-05-25 DIAGNOSIS — R2681 Unsteadiness on feet: Secondary | ICD-10-CM | POA: Diagnosis not present

## 2021-06-03 DIAGNOSIS — F603 Borderline personality disorder: Secondary | ICD-10-CM | POA: Diagnosis not present

## 2021-06-03 DIAGNOSIS — F0151 Vascular dementia with behavioral disturbance: Secondary | ICD-10-CM | POA: Diagnosis not present

## 2021-06-03 DIAGNOSIS — I480 Paroxysmal atrial fibrillation: Secondary | ICD-10-CM | POA: Diagnosis not present

## 2021-06-12 DIAGNOSIS — G4700 Insomnia, unspecified: Secondary | ICD-10-CM | POA: Diagnosis not present

## 2021-06-12 DIAGNOSIS — I482 Chronic atrial fibrillation, unspecified: Secondary | ICD-10-CM | POA: Diagnosis not present

## 2021-06-12 DIAGNOSIS — E43 Unspecified severe protein-calorie malnutrition: Secondary | ICD-10-CM | POA: Diagnosis not present

## 2021-06-12 DIAGNOSIS — F0151 Vascular dementia with behavioral disturbance: Secondary | ICD-10-CM | POA: Diagnosis not present

## 2021-06-16 DIAGNOSIS — F0151 Vascular dementia with behavioral disturbance: Secondary | ICD-10-CM | POA: Diagnosis not present

## 2021-06-16 DIAGNOSIS — L603 Nail dystrophy: Secondary | ICD-10-CM | POA: Diagnosis not present

## 2021-06-16 DIAGNOSIS — R296 Repeated falls: Secondary | ICD-10-CM | POA: Diagnosis not present

## 2021-06-16 DIAGNOSIS — G4700 Insomnia, unspecified: Secondary | ICD-10-CM | POA: Diagnosis not present

## 2021-06-16 DIAGNOSIS — B351 Tinea unguium: Secondary | ICD-10-CM | POA: Diagnosis not present

## 2021-06-16 DIAGNOSIS — I739 Peripheral vascular disease, unspecified: Secondary | ICD-10-CM | POA: Diagnosis not present

## 2021-06-16 DIAGNOSIS — I482 Chronic atrial fibrillation, unspecified: Secondary | ICD-10-CM | POA: Diagnosis not present

## 2021-06-16 DIAGNOSIS — B353 Tinea pedis: Secondary | ICD-10-CM | POA: Diagnosis not present

## 2021-06-16 DIAGNOSIS — U071 COVID-19: Secondary | ICD-10-CM | POA: Diagnosis not present

## 2021-06-16 DIAGNOSIS — E43 Unspecified severe protein-calorie malnutrition: Secondary | ICD-10-CM | POA: Diagnosis not present

## 2021-06-16 DIAGNOSIS — G8929 Other chronic pain: Secondary | ICD-10-CM | POA: Diagnosis not present

## 2021-07-01 DIAGNOSIS — F603 Borderline personality disorder: Secondary | ICD-10-CM | POA: Diagnosis not present

## 2021-07-01 DIAGNOSIS — I480 Paroxysmal atrial fibrillation: Secondary | ICD-10-CM | POA: Diagnosis not present

## 2021-07-01 DIAGNOSIS — F0151 Vascular dementia with behavioral disturbance: Secondary | ICD-10-CM | POA: Diagnosis not present

## 2021-07-01 DIAGNOSIS — R451 Restlessness and agitation: Secondary | ICD-10-CM | POA: Diagnosis not present

## 2021-07-01 DIAGNOSIS — G4701 Insomnia due to medical condition: Secondary | ICD-10-CM | POA: Diagnosis not present

## 2021-07-15 DIAGNOSIS — M6281 Muscle weakness (generalized): Secondary | ICD-10-CM | POA: Diagnosis not present

## 2021-07-15 DIAGNOSIS — R627 Adult failure to thrive: Secondary | ICD-10-CM | POA: Diagnosis not present

## 2021-07-15 DIAGNOSIS — R2681 Unsteadiness on feet: Secondary | ICD-10-CM | POA: Diagnosis not present

## 2021-07-15 DIAGNOSIS — R0981 Nasal congestion: Secondary | ICD-10-CM | POA: Diagnosis not present

## 2021-07-15 DIAGNOSIS — I2510 Atherosclerotic heart disease of native coronary artery without angina pectoris: Secondary | ICD-10-CM | POA: Diagnosis not present

## 2021-07-15 DIAGNOSIS — F0150 Vascular dementia without behavioral disturbance: Secondary | ICD-10-CM | POA: Diagnosis not present

## 2021-07-15 DIAGNOSIS — I10 Essential (primary) hypertension: Secondary | ICD-10-CM | POA: Diagnosis not present

## 2021-07-20 DIAGNOSIS — R627 Adult failure to thrive: Secondary | ICD-10-CM | POA: Diagnosis not present

## 2021-07-20 DIAGNOSIS — R0981 Nasal congestion: Secondary | ICD-10-CM | POA: Diagnosis not present

## 2021-07-20 DIAGNOSIS — M6259 Muscle wasting and atrophy, not elsewhere classified, multiple sites: Secondary | ICD-10-CM | POA: Diagnosis not present

## 2021-07-20 DIAGNOSIS — M6281 Muscle weakness (generalized): Secondary | ICD-10-CM | POA: Diagnosis not present

## 2021-07-20 DIAGNOSIS — R278 Other lack of coordination: Secondary | ICD-10-CM | POA: Diagnosis not present

## 2021-07-20 DIAGNOSIS — F0150 Vascular dementia without behavioral disturbance: Secondary | ICD-10-CM | POA: Diagnosis not present

## 2021-07-20 DIAGNOSIS — R2681 Unsteadiness on feet: Secondary | ICD-10-CM | POA: Diagnosis not present

## 2021-07-20 DIAGNOSIS — I2510 Atherosclerotic heart disease of native coronary artery without angina pectoris: Secondary | ICD-10-CM | POA: Diagnosis not present

## 2021-07-20 DIAGNOSIS — I4891 Unspecified atrial fibrillation: Secondary | ICD-10-CM | POA: Diagnosis not present

## 2021-07-21 DIAGNOSIS — I2510 Atherosclerotic heart disease of native coronary artery without angina pectoris: Secondary | ICD-10-CM | POA: Diagnosis not present

## 2021-07-21 DIAGNOSIS — R0981 Nasal congestion: Secondary | ICD-10-CM | POA: Diagnosis not present

## 2021-07-21 DIAGNOSIS — R627 Adult failure to thrive: Secondary | ICD-10-CM | POA: Diagnosis not present

## 2021-07-21 DIAGNOSIS — R2681 Unsteadiness on feet: Secondary | ICD-10-CM | POA: Diagnosis not present

## 2021-07-21 DIAGNOSIS — R278 Other lack of coordination: Secondary | ICD-10-CM | POA: Diagnosis not present

## 2021-07-21 DIAGNOSIS — F0150 Vascular dementia without behavioral disturbance: Secondary | ICD-10-CM | POA: Diagnosis not present

## 2021-07-21 DIAGNOSIS — I4891 Unspecified atrial fibrillation: Secondary | ICD-10-CM | POA: Diagnosis not present

## 2021-07-21 DIAGNOSIS — M6259 Muscle wasting and atrophy, not elsewhere classified, multiple sites: Secondary | ICD-10-CM | POA: Diagnosis not present

## 2021-07-21 DIAGNOSIS — M6281 Muscle weakness (generalized): Secondary | ICD-10-CM | POA: Diagnosis not present

## 2021-07-22 DIAGNOSIS — R2681 Unsteadiness on feet: Secondary | ICD-10-CM | POA: Diagnosis not present

## 2021-07-22 DIAGNOSIS — R251 Tremor, unspecified: Secondary | ICD-10-CM | POA: Diagnosis not present

## 2021-07-22 DIAGNOSIS — I4891 Unspecified atrial fibrillation: Secondary | ICD-10-CM | POA: Diagnosis not present

## 2021-07-22 DIAGNOSIS — I2510 Atherosclerotic heart disease of native coronary artery without angina pectoris: Secondary | ICD-10-CM | POA: Diagnosis not present

## 2021-07-22 DIAGNOSIS — F0150 Vascular dementia without behavioral disturbance: Secondary | ICD-10-CM | POA: Diagnosis not present

## 2021-07-22 DIAGNOSIS — M6281 Muscle weakness (generalized): Secondary | ICD-10-CM | POA: Diagnosis not present

## 2021-07-22 DIAGNOSIS — E43 Unspecified severe protein-calorie malnutrition: Secondary | ICD-10-CM | POA: Diagnosis not present

## 2021-07-22 DIAGNOSIS — R627 Adult failure to thrive: Secondary | ICD-10-CM | POA: Diagnosis not present

## 2021-07-22 DIAGNOSIS — M6259 Muscle wasting and atrophy, not elsewhere classified, multiple sites: Secondary | ICD-10-CM | POA: Diagnosis not present

## 2021-07-22 DIAGNOSIS — R278 Other lack of coordination: Secondary | ICD-10-CM | POA: Diagnosis not present

## 2021-07-22 DIAGNOSIS — R0981 Nasal congestion: Secondary | ICD-10-CM | POA: Diagnosis not present

## 2021-07-23 DIAGNOSIS — M6259 Muscle wasting and atrophy, not elsewhere classified, multiple sites: Secondary | ICD-10-CM | POA: Diagnosis not present

## 2021-07-23 DIAGNOSIS — R278 Other lack of coordination: Secondary | ICD-10-CM | POA: Diagnosis not present

## 2021-07-23 DIAGNOSIS — I2510 Atherosclerotic heart disease of native coronary artery without angina pectoris: Secondary | ICD-10-CM | POA: Diagnosis not present

## 2021-07-23 DIAGNOSIS — R2681 Unsteadiness on feet: Secondary | ICD-10-CM | POA: Diagnosis not present

## 2021-07-23 DIAGNOSIS — M6281 Muscle weakness (generalized): Secondary | ICD-10-CM | POA: Diagnosis not present

## 2021-07-23 DIAGNOSIS — F0150 Vascular dementia without behavioral disturbance: Secondary | ICD-10-CM | POA: Diagnosis not present

## 2021-07-23 DIAGNOSIS — I4891 Unspecified atrial fibrillation: Secondary | ICD-10-CM | POA: Diagnosis not present

## 2021-07-23 DIAGNOSIS — R627 Adult failure to thrive: Secondary | ICD-10-CM | POA: Diagnosis not present

## 2021-07-23 DIAGNOSIS — R0981 Nasal congestion: Secondary | ICD-10-CM | POA: Diagnosis not present

## 2021-07-24 DIAGNOSIS — F0150 Vascular dementia without behavioral disturbance: Secondary | ICD-10-CM | POA: Diagnosis not present

## 2021-07-24 DIAGNOSIS — M6259 Muscle wasting and atrophy, not elsewhere classified, multiple sites: Secondary | ICD-10-CM | POA: Diagnosis not present

## 2021-07-24 DIAGNOSIS — I2510 Atherosclerotic heart disease of native coronary artery without angina pectoris: Secondary | ICD-10-CM | POA: Diagnosis not present

## 2021-07-24 DIAGNOSIS — M6281 Muscle weakness (generalized): Secondary | ICD-10-CM | POA: Diagnosis not present

## 2021-07-24 DIAGNOSIS — R627 Adult failure to thrive: Secondary | ICD-10-CM | POA: Diagnosis not present

## 2021-07-24 DIAGNOSIS — I4891 Unspecified atrial fibrillation: Secondary | ICD-10-CM | POA: Diagnosis not present

## 2021-07-24 DIAGNOSIS — R2681 Unsteadiness on feet: Secondary | ICD-10-CM | POA: Diagnosis not present

## 2021-07-24 DIAGNOSIS — R0981 Nasal congestion: Secondary | ICD-10-CM | POA: Diagnosis not present

## 2021-07-24 DIAGNOSIS — R278 Other lack of coordination: Secondary | ICD-10-CM | POA: Diagnosis not present

## 2021-07-27 DIAGNOSIS — R0981 Nasal congestion: Secondary | ICD-10-CM | POA: Diagnosis not present

## 2021-07-27 DIAGNOSIS — M6281 Muscle weakness (generalized): Secondary | ICD-10-CM | POA: Diagnosis not present

## 2021-07-27 DIAGNOSIS — I2510 Atherosclerotic heart disease of native coronary artery without angina pectoris: Secondary | ICD-10-CM | POA: Diagnosis not present

## 2021-07-27 DIAGNOSIS — R278 Other lack of coordination: Secondary | ICD-10-CM | POA: Diagnosis not present

## 2021-07-27 DIAGNOSIS — M6259 Muscle wasting and atrophy, not elsewhere classified, multiple sites: Secondary | ICD-10-CM | POA: Diagnosis not present

## 2021-07-27 DIAGNOSIS — F0150 Vascular dementia without behavioral disturbance: Secondary | ICD-10-CM | POA: Diagnosis not present

## 2021-07-27 DIAGNOSIS — I4891 Unspecified atrial fibrillation: Secondary | ICD-10-CM | POA: Diagnosis not present

## 2021-07-27 DIAGNOSIS — R627 Adult failure to thrive: Secondary | ICD-10-CM | POA: Diagnosis not present

## 2021-07-27 DIAGNOSIS — R2681 Unsteadiness on feet: Secondary | ICD-10-CM | POA: Diagnosis not present

## 2021-07-28 DIAGNOSIS — M6281 Muscle weakness (generalized): Secondary | ICD-10-CM | POA: Diagnosis not present

## 2021-07-28 DIAGNOSIS — R2681 Unsteadiness on feet: Secondary | ICD-10-CM | POA: Diagnosis not present

## 2021-07-28 DIAGNOSIS — R0981 Nasal congestion: Secondary | ICD-10-CM | POA: Diagnosis not present

## 2021-07-28 DIAGNOSIS — M6259 Muscle wasting and atrophy, not elsewhere classified, multiple sites: Secondary | ICD-10-CM | POA: Diagnosis not present

## 2021-07-28 DIAGNOSIS — F0150 Vascular dementia without behavioral disturbance: Secondary | ICD-10-CM | POA: Diagnosis not present

## 2021-07-28 DIAGNOSIS — R627 Adult failure to thrive: Secondary | ICD-10-CM | POA: Diagnosis not present

## 2021-07-28 DIAGNOSIS — I4891 Unspecified atrial fibrillation: Secondary | ICD-10-CM | POA: Diagnosis not present

## 2021-07-28 DIAGNOSIS — R278 Other lack of coordination: Secondary | ICD-10-CM | POA: Diagnosis not present

## 2021-07-28 DIAGNOSIS — I2510 Atherosclerotic heart disease of native coronary artery without angina pectoris: Secondary | ICD-10-CM | POA: Diagnosis not present

## 2021-07-29 DIAGNOSIS — F603 Borderline personality disorder: Secondary | ICD-10-CM | POA: Diagnosis not present

## 2021-07-29 DIAGNOSIS — G4701 Insomnia due to medical condition: Secondary | ICD-10-CM | POA: Diagnosis not present

## 2021-07-29 DIAGNOSIS — I480 Paroxysmal atrial fibrillation: Secondary | ICD-10-CM | POA: Diagnosis not present

## 2021-07-29 DIAGNOSIS — F01518 Vascular dementia, unspecified severity, with other behavioral disturbance: Secondary | ICD-10-CM | POA: Diagnosis not present

## 2021-07-29 DIAGNOSIS — R451 Restlessness and agitation: Secondary | ICD-10-CM | POA: Diagnosis not present

## 2021-08-01 DIAGNOSIS — B9620 Unspecified Escherichia coli [E. coli] as the cause of diseases classified elsewhere: Secondary | ICD-10-CM | POA: Diagnosis not present

## 2021-08-01 DIAGNOSIS — N390 Urinary tract infection, site not specified: Secondary | ICD-10-CM | POA: Diagnosis not present

## 2021-08-03 DIAGNOSIS — E43 Unspecified severe protein-calorie malnutrition: Secondary | ICD-10-CM | POA: Diagnosis not present

## 2021-08-03 DIAGNOSIS — M6281 Muscle weakness (generalized): Secondary | ICD-10-CM | POA: Diagnosis not present

## 2021-08-03 DIAGNOSIS — R251 Tremor, unspecified: Secondary | ICD-10-CM | POA: Diagnosis not present

## 2021-08-03 DIAGNOSIS — N390 Urinary tract infection, site not specified: Secondary | ICD-10-CM | POA: Diagnosis not present

## 2021-08-03 DIAGNOSIS — F01511 Vascular dementia, unspecified severity, with agitation: Secondary | ICD-10-CM | POA: Diagnosis not present

## 2021-08-05 DIAGNOSIS — Z23 Encounter for immunization: Secondary | ICD-10-CM | POA: Diagnosis not present

## 2021-08-10 DIAGNOSIS — R2681 Unsteadiness on feet: Secondary | ICD-10-CM | POA: Diagnosis not present

## 2021-08-10 DIAGNOSIS — M6281 Muscle weakness (generalized): Secondary | ICD-10-CM | POA: Diagnosis not present

## 2021-08-10 DIAGNOSIS — R278 Other lack of coordination: Secondary | ICD-10-CM | POA: Diagnosis not present

## 2021-08-10 DIAGNOSIS — I4891 Unspecified atrial fibrillation: Secondary | ICD-10-CM | POA: Diagnosis not present

## 2021-08-10 DIAGNOSIS — R0981 Nasal congestion: Secondary | ICD-10-CM | POA: Diagnosis not present

## 2021-08-10 DIAGNOSIS — F0150 Vascular dementia without behavioral disturbance: Secondary | ICD-10-CM | POA: Diagnosis not present

## 2021-08-10 DIAGNOSIS — I2510 Atherosclerotic heart disease of native coronary artery without angina pectoris: Secondary | ICD-10-CM | POA: Diagnosis not present

## 2021-08-10 DIAGNOSIS — M6259 Muscle wasting and atrophy, not elsewhere classified, multiple sites: Secondary | ICD-10-CM | POA: Diagnosis not present

## 2021-08-10 DIAGNOSIS — R627 Adult failure to thrive: Secondary | ICD-10-CM | POA: Diagnosis not present

## 2021-08-17 DIAGNOSIS — F0150 Vascular dementia without behavioral disturbance: Secondary | ICD-10-CM | POA: Diagnosis not present

## 2021-08-17 DIAGNOSIS — R278 Other lack of coordination: Secondary | ICD-10-CM | POA: Diagnosis not present

## 2021-08-17 DIAGNOSIS — R627 Adult failure to thrive: Secondary | ICD-10-CM | POA: Diagnosis not present

## 2021-08-17 DIAGNOSIS — M6259 Muscle wasting and atrophy, not elsewhere classified, multiple sites: Secondary | ICD-10-CM | POA: Diagnosis not present

## 2021-08-17 DIAGNOSIS — I4891 Unspecified atrial fibrillation: Secondary | ICD-10-CM | POA: Diagnosis not present

## 2021-08-17 DIAGNOSIS — R0981 Nasal congestion: Secondary | ICD-10-CM | POA: Diagnosis not present

## 2021-08-17 DIAGNOSIS — R2681 Unsteadiness on feet: Secondary | ICD-10-CM | POA: Diagnosis not present

## 2021-08-17 DIAGNOSIS — M6281 Muscle weakness (generalized): Secondary | ICD-10-CM | POA: Diagnosis not present

## 2021-08-17 DIAGNOSIS — I2510 Atherosclerotic heart disease of native coronary artery without angina pectoris: Secondary | ICD-10-CM | POA: Diagnosis not present

## 2021-08-18 DIAGNOSIS — F0150 Vascular dementia without behavioral disturbance: Secondary | ICD-10-CM | POA: Diagnosis not present

## 2021-08-18 DIAGNOSIS — R2681 Unsteadiness on feet: Secondary | ICD-10-CM | POA: Diagnosis not present

## 2021-08-18 DIAGNOSIS — M6281 Muscle weakness (generalized): Secondary | ICD-10-CM | POA: Diagnosis not present

## 2021-08-18 DIAGNOSIS — R0981 Nasal congestion: Secondary | ICD-10-CM | POA: Diagnosis not present

## 2021-08-18 DIAGNOSIS — I10 Essential (primary) hypertension: Secondary | ICD-10-CM | POA: Diagnosis not present

## 2021-08-18 DIAGNOSIS — I2510 Atherosclerotic heart disease of native coronary artery without angina pectoris: Secondary | ICD-10-CM | POA: Diagnosis not present

## 2021-08-18 DIAGNOSIS — R278 Other lack of coordination: Secondary | ICD-10-CM | POA: Diagnosis not present

## 2021-08-19 DIAGNOSIS — R2681 Unsteadiness on feet: Secondary | ICD-10-CM | POA: Diagnosis not present

## 2021-08-19 DIAGNOSIS — I2510 Atherosclerotic heart disease of native coronary artery without angina pectoris: Secondary | ICD-10-CM | POA: Diagnosis not present

## 2021-08-19 DIAGNOSIS — I10 Essential (primary) hypertension: Secondary | ICD-10-CM | POA: Diagnosis not present

## 2021-08-19 DIAGNOSIS — R0981 Nasal congestion: Secondary | ICD-10-CM | POA: Diagnosis not present

## 2021-08-19 DIAGNOSIS — F0150 Vascular dementia without behavioral disturbance: Secondary | ICD-10-CM | POA: Diagnosis not present

## 2021-08-19 DIAGNOSIS — M6281 Muscle weakness (generalized): Secondary | ICD-10-CM | POA: Diagnosis not present

## 2021-08-19 DIAGNOSIS — R278 Other lack of coordination: Secondary | ICD-10-CM | POA: Diagnosis not present

## 2021-08-20 DIAGNOSIS — I2510 Atherosclerotic heart disease of native coronary artery without angina pectoris: Secondary | ICD-10-CM | POA: Diagnosis not present

## 2021-08-20 DIAGNOSIS — F0150 Vascular dementia without behavioral disturbance: Secondary | ICD-10-CM | POA: Diagnosis not present

## 2021-08-20 DIAGNOSIS — R2681 Unsteadiness on feet: Secondary | ICD-10-CM | POA: Diagnosis not present

## 2021-08-20 DIAGNOSIS — I482 Chronic atrial fibrillation, unspecified: Secondary | ICD-10-CM | POA: Diagnosis not present

## 2021-08-20 DIAGNOSIS — E43 Unspecified severe protein-calorie malnutrition: Secondary | ICD-10-CM | POA: Diagnosis not present

## 2021-08-20 DIAGNOSIS — R0981 Nasal congestion: Secondary | ICD-10-CM | POA: Diagnosis not present

## 2021-08-20 DIAGNOSIS — R296 Repeated falls: Secondary | ICD-10-CM | POA: Diagnosis not present

## 2021-08-20 DIAGNOSIS — R278 Other lack of coordination: Secondary | ICD-10-CM | POA: Diagnosis not present

## 2021-08-20 DIAGNOSIS — M6281 Muscle weakness (generalized): Secondary | ICD-10-CM | POA: Diagnosis not present

## 2021-08-20 DIAGNOSIS — I10 Essential (primary) hypertension: Secondary | ICD-10-CM | POA: Diagnosis not present

## 2021-08-20 DIAGNOSIS — G8929 Other chronic pain: Secondary | ICD-10-CM | POA: Diagnosis not present

## 2021-08-20 DIAGNOSIS — U071 COVID-19: Secondary | ICD-10-CM | POA: Diagnosis not present

## 2021-08-20 DIAGNOSIS — F01518 Vascular dementia, unspecified severity, with other behavioral disturbance: Secondary | ICD-10-CM | POA: Diagnosis not present

## 2021-08-20 DIAGNOSIS — G4700 Insomnia, unspecified: Secondary | ICD-10-CM | POA: Diagnosis not present

## 2021-08-21 DIAGNOSIS — F0150 Vascular dementia without behavioral disturbance: Secondary | ICD-10-CM | POA: Diagnosis not present

## 2021-08-21 DIAGNOSIS — I10 Essential (primary) hypertension: Secondary | ICD-10-CM | POA: Diagnosis not present

## 2021-08-21 DIAGNOSIS — M6281 Muscle weakness (generalized): Secondary | ICD-10-CM | POA: Diagnosis not present

## 2021-08-21 DIAGNOSIS — R2681 Unsteadiness on feet: Secondary | ICD-10-CM | POA: Diagnosis not present

## 2021-08-21 DIAGNOSIS — I2510 Atherosclerotic heart disease of native coronary artery without angina pectoris: Secondary | ICD-10-CM | POA: Diagnosis not present

## 2021-08-21 DIAGNOSIS — R278 Other lack of coordination: Secondary | ICD-10-CM | POA: Diagnosis not present

## 2021-08-21 DIAGNOSIS — R0981 Nasal congestion: Secondary | ICD-10-CM | POA: Diagnosis not present

## 2021-08-24 DIAGNOSIS — M6281 Muscle weakness (generalized): Secondary | ICD-10-CM | POA: Diagnosis not present

## 2021-08-24 DIAGNOSIS — I2510 Atherosclerotic heart disease of native coronary artery without angina pectoris: Secondary | ICD-10-CM | POA: Diagnosis not present

## 2021-08-24 DIAGNOSIS — R2681 Unsteadiness on feet: Secondary | ICD-10-CM | POA: Diagnosis not present

## 2021-08-24 DIAGNOSIS — R0981 Nasal congestion: Secondary | ICD-10-CM | POA: Diagnosis not present

## 2021-08-24 DIAGNOSIS — R278 Other lack of coordination: Secondary | ICD-10-CM | POA: Diagnosis not present

## 2021-08-24 DIAGNOSIS — I10 Essential (primary) hypertension: Secondary | ICD-10-CM | POA: Diagnosis not present

## 2021-08-24 DIAGNOSIS — F0150 Vascular dementia without behavioral disturbance: Secondary | ICD-10-CM | POA: Diagnosis not present

## 2021-08-25 DIAGNOSIS — M6281 Muscle weakness (generalized): Secondary | ICD-10-CM | POA: Diagnosis not present

## 2021-08-25 DIAGNOSIS — F0150 Vascular dementia without behavioral disturbance: Secondary | ICD-10-CM | POA: Diagnosis not present

## 2021-08-25 DIAGNOSIS — I2510 Atherosclerotic heart disease of native coronary artery without angina pectoris: Secondary | ICD-10-CM | POA: Diagnosis not present

## 2021-08-25 DIAGNOSIS — R278 Other lack of coordination: Secondary | ICD-10-CM | POA: Diagnosis not present

## 2021-08-25 DIAGNOSIS — I10 Essential (primary) hypertension: Secondary | ICD-10-CM | POA: Diagnosis not present

## 2021-08-25 DIAGNOSIS — R0981 Nasal congestion: Secondary | ICD-10-CM | POA: Diagnosis not present

## 2021-08-25 DIAGNOSIS — R2681 Unsteadiness on feet: Secondary | ICD-10-CM | POA: Diagnosis not present

## 2021-08-26 DIAGNOSIS — F603 Borderline personality disorder: Secondary | ICD-10-CM | POA: Diagnosis not present

## 2021-08-26 DIAGNOSIS — M6281 Muscle weakness (generalized): Secondary | ICD-10-CM | POA: Diagnosis not present

## 2021-08-26 DIAGNOSIS — R2681 Unsteadiness on feet: Secondary | ICD-10-CM | POA: Diagnosis not present

## 2021-08-26 DIAGNOSIS — I2510 Atherosclerotic heart disease of native coronary artery without angina pectoris: Secondary | ICD-10-CM | POA: Diagnosis not present

## 2021-08-26 DIAGNOSIS — R278 Other lack of coordination: Secondary | ICD-10-CM | POA: Diagnosis not present

## 2021-08-26 DIAGNOSIS — G4701 Insomnia due to medical condition: Secondary | ICD-10-CM | POA: Diagnosis not present

## 2021-08-26 DIAGNOSIS — F01518 Vascular dementia, unspecified severity, with other behavioral disturbance: Secondary | ICD-10-CM | POA: Diagnosis not present

## 2021-08-26 DIAGNOSIS — F0150 Vascular dementia without behavioral disturbance: Secondary | ICD-10-CM | POA: Diagnosis not present

## 2021-08-26 DIAGNOSIS — I10 Essential (primary) hypertension: Secondary | ICD-10-CM | POA: Diagnosis not present

## 2021-08-26 DIAGNOSIS — R451 Restlessness and agitation: Secondary | ICD-10-CM | POA: Diagnosis not present

## 2021-08-26 DIAGNOSIS — R0981 Nasal congestion: Secondary | ICD-10-CM | POA: Diagnosis not present

## 2021-08-26 DIAGNOSIS — I480 Paroxysmal atrial fibrillation: Secondary | ICD-10-CM | POA: Diagnosis not present

## 2021-08-27 DIAGNOSIS — R278 Other lack of coordination: Secondary | ICD-10-CM | POA: Diagnosis not present

## 2021-08-27 DIAGNOSIS — R2681 Unsteadiness on feet: Secondary | ICD-10-CM | POA: Diagnosis not present

## 2021-08-27 DIAGNOSIS — I2510 Atherosclerotic heart disease of native coronary artery without angina pectoris: Secondary | ICD-10-CM | POA: Diagnosis not present

## 2021-08-27 DIAGNOSIS — F0150 Vascular dementia without behavioral disturbance: Secondary | ICD-10-CM | POA: Diagnosis not present

## 2021-08-27 DIAGNOSIS — M6281 Muscle weakness (generalized): Secondary | ICD-10-CM | POA: Diagnosis not present

## 2021-08-27 DIAGNOSIS — R0981 Nasal congestion: Secondary | ICD-10-CM | POA: Diagnosis not present

## 2021-08-27 DIAGNOSIS — I10 Essential (primary) hypertension: Secondary | ICD-10-CM | POA: Diagnosis not present

## 2021-08-28 DIAGNOSIS — R0981 Nasal congestion: Secondary | ICD-10-CM | POA: Diagnosis not present

## 2021-08-28 DIAGNOSIS — I2510 Atherosclerotic heart disease of native coronary artery without angina pectoris: Secondary | ICD-10-CM | POA: Diagnosis not present

## 2021-08-28 DIAGNOSIS — R2681 Unsteadiness on feet: Secondary | ICD-10-CM | POA: Diagnosis not present

## 2021-08-28 DIAGNOSIS — M6281 Muscle weakness (generalized): Secondary | ICD-10-CM | POA: Diagnosis not present

## 2021-08-28 DIAGNOSIS — F0150 Vascular dementia without behavioral disturbance: Secondary | ICD-10-CM | POA: Diagnosis not present

## 2021-08-28 DIAGNOSIS — R278 Other lack of coordination: Secondary | ICD-10-CM | POA: Diagnosis not present

## 2021-08-28 DIAGNOSIS — I10 Essential (primary) hypertension: Secondary | ICD-10-CM | POA: Diagnosis not present

## 2021-08-31 DIAGNOSIS — I2510 Atherosclerotic heart disease of native coronary artery without angina pectoris: Secondary | ICD-10-CM | POA: Diagnosis not present

## 2021-08-31 DIAGNOSIS — F0150 Vascular dementia without behavioral disturbance: Secondary | ICD-10-CM | POA: Diagnosis not present

## 2021-08-31 DIAGNOSIS — I10 Essential (primary) hypertension: Secondary | ICD-10-CM | POA: Diagnosis not present

## 2021-08-31 DIAGNOSIS — R0981 Nasal congestion: Secondary | ICD-10-CM | POA: Diagnosis not present

## 2021-08-31 DIAGNOSIS — R2681 Unsteadiness on feet: Secondary | ICD-10-CM | POA: Diagnosis not present

## 2021-08-31 DIAGNOSIS — M6281 Muscle weakness (generalized): Secondary | ICD-10-CM | POA: Diagnosis not present

## 2021-08-31 DIAGNOSIS — R278 Other lack of coordination: Secondary | ICD-10-CM | POA: Diagnosis not present

## 2021-09-04 DIAGNOSIS — I482 Chronic atrial fibrillation, unspecified: Secondary | ICD-10-CM | POA: Diagnosis not present

## 2021-09-04 DIAGNOSIS — S00521A Blister (nonthermal) of lip, initial encounter: Secondary | ICD-10-CM | POA: Diagnosis not present

## 2021-09-04 DIAGNOSIS — G8929 Other chronic pain: Secondary | ICD-10-CM | POA: Diagnosis not present

## 2021-09-07 DIAGNOSIS — G8929 Other chronic pain: Secondary | ICD-10-CM | POA: Diagnosis not present

## 2021-09-07 DIAGNOSIS — F01511 Vascular dementia, unspecified severity, with agitation: Secondary | ICD-10-CM | POA: Diagnosis not present

## 2021-09-07 DIAGNOSIS — R400 Somnolence: Secondary | ICD-10-CM | POA: Diagnosis not present

## 2021-09-09 DIAGNOSIS — G4701 Insomnia due to medical condition: Secondary | ICD-10-CM | POA: Diagnosis not present

## 2021-09-09 DIAGNOSIS — F603 Borderline personality disorder: Secondary | ICD-10-CM | POA: Diagnosis not present

## 2021-09-09 DIAGNOSIS — I480 Paroxysmal atrial fibrillation: Secondary | ICD-10-CM | POA: Diagnosis not present

## 2021-09-09 DIAGNOSIS — F01518 Vascular dementia, unspecified severity, with other behavioral disturbance: Secondary | ICD-10-CM | POA: Diagnosis not present

## 2021-09-09 DIAGNOSIS — R451 Restlessness and agitation: Secondary | ICD-10-CM | POA: Diagnosis not present

## 2021-09-23 DIAGNOSIS — I480 Paroxysmal atrial fibrillation: Secondary | ICD-10-CM | POA: Diagnosis not present

## 2021-09-23 DIAGNOSIS — F603 Borderline personality disorder: Secondary | ICD-10-CM | POA: Diagnosis not present

## 2021-09-23 DIAGNOSIS — N390 Urinary tract infection, site not specified: Secondary | ICD-10-CM | POA: Diagnosis not present

## 2021-09-23 DIAGNOSIS — R451 Restlessness and agitation: Secondary | ICD-10-CM | POA: Diagnosis not present

## 2021-09-23 DIAGNOSIS — G4701 Insomnia due to medical condition: Secondary | ICD-10-CM | POA: Diagnosis not present

## 2021-09-23 DIAGNOSIS — F01518 Vascular dementia, unspecified severity, with other behavioral disturbance: Secondary | ICD-10-CM | POA: Diagnosis not present

## 2021-09-24 DIAGNOSIS — N390 Urinary tract infection, site not specified: Secondary | ICD-10-CM | POA: Diagnosis not present

## 2021-09-24 DIAGNOSIS — E43 Unspecified severe protein-calorie malnutrition: Secondary | ICD-10-CM | POA: Diagnosis not present

## 2021-09-25 ENCOUNTER — Non-Acute Institutional Stay: Payer: Medicare HMO | Admitting: Primary Care

## 2021-09-25 ENCOUNTER — Other Ambulatory Visit: Payer: Self-pay

## 2021-09-25 DIAGNOSIS — F03C11 Unspecified dementia, severe, with agitation: Secondary | ICD-10-CM | POA: Diagnosis not present

## 2021-09-25 NOTE — Progress Notes (Signed)
[facility] Consult Note Telephone: [phone]  Fax: [phone]    Date of encounter: 09/25/21 5:14 PM PATIENT NAME: Tara Huffman [address] [place] [address]   [phone] (home)  DOB: Tara Huffman MRN: [id number] PRIMARY CARE PROVIDER:    Rica Koyanagi, MD,  [place] [address] [phone]  REFERRING PROVIDER:   Rica Koyanagi, MD [address] [place],  [place] [address] [phone]  RESPONSIBLE PARTY:    Contact Information     Name Relation Home Work [place] Daughter [phone]  [phone]   Tara Huffman Sister [phone]          I met face to [facility] in [facility]. Palliative Care was asked to follow this patient by consultation request of  Tara Koyanagi, MD to address advance care planning and complex medical decision making. This is a follow up visit.                                   ASSESSMENT AND PLAN / RECOMMENDATIONS:   Advance Care Planning/Goals of Care: Goals include to maximize quality of life and symptom management.  CODE STATUS: DNR  Symptom Management/Plan:  I met with patient in her nursing home. She is mobilizing in her wheelchair, able to discuss  some things that are pertinent but many things that aren't. Staff endorse she is at her baseline with behaviors which occasionally include some outbursts but generally she is cooperative and easily distracted when upset. Her weights are stable and her disease process appears stable at this time.  Fast stage 6D  She is being rx for uti, and had lorazepam increased from 0.5 mg bid prn to bid. She appears relaxed today and comfortable. Medications reviewed.    Follow up Palliative Care Visit: Palliative care will continue to follow for complex medical decision making, advance care planning, and clarification of goals. Return 8 weeks or prn.  I spent 25 minutes providing this consultation. More  than 50% of the time in this consultation was spent in counseling and care coordination.  PPS: 40%  HOSPICE ELIGIBILITY/DIAGNOSIS: TBD  Chief Complaint: agitation  HISTORY OF PRESENT ILLNESS:  Tara Huffman is a 80 y.o. year old female  with h/o alcohol abuse, dementia, agitation .   History obtained from review of EMR, discussion with primary team, and interview with family, facility staff/caregiver and/or Tara Huffman.  I reviewed available labs, medications, imaging, studies and related documents from the EMR.  Records reviewed and summarized above.   ROS   General: NAD EYES: denies vision changes ENMT: denies dysphagia Pulmonary: denies cough, denies increased SOB Abdomen: endorses good appetite, denies constipation, endorses incontinence of bowel GU: denies dysuria, endorses incontinence of urine MSK:  endorses  weakness,  no falls reported Skin: denies rashes or wounds Neurological: denies pain, denies insomnia Psych: Endorses agitated at times mood Heme/lymph/immuno: denies bruises, abnormal bleeding  Physical Exam: Current and past weights:114 lbs Constitutional: NAD General: frail appearing, thin EYES: anicteric sclera, lids intact, no discharge  ENMT: intact hearing, oral mucous membranes moist CV: no LE edema Pulmonary: no increased work of breathing, no cough, room air Abdomen: intake 75%, no ascites GU: deferred MSK: + sarcopenia, moves all extremities,  non ambulatory Skin: warm and dry, no rashes or wounds on visible skin Neuro:  no generalized weakness,  severe  cognitive impairment Psych:  at times anxious affect, A and O x 1 Hem/lymph/immuno: no widespread bruising   Thank you for the opportunity to participate in the care of Tara Huffman.  The palliative care team will continue to follow. Please call our office at [phone] if we can be of additional assistance.   Tara Coop, NP DNP, AGPCNP-BC  COVID-19 PATIENT SCREENING  TOOL Asked and negative response unless otherwise noted:   Have you had symptoms of covid, tested positive or been in contact with someone with symptoms/positive test in the past 5-10 days?

## 2021-10-12 DIAGNOSIS — M6281 Muscle weakness (generalized): Secondary | ICD-10-CM | POA: Diagnosis not present

## 2021-10-12 DIAGNOSIS — R2681 Unsteadiness on feet: Secondary | ICD-10-CM | POA: Diagnosis not present

## 2021-10-12 DIAGNOSIS — R278 Other lack of coordination: Secondary | ICD-10-CM | POA: Diagnosis not present

## 2021-10-12 DIAGNOSIS — I10 Essential (primary) hypertension: Secondary | ICD-10-CM | POA: Diagnosis not present

## 2021-10-12 DIAGNOSIS — I2510 Atherosclerotic heart disease of native coronary artery without angina pectoris: Secondary | ICD-10-CM | POA: Diagnosis not present

## 2021-10-12 DIAGNOSIS — R0981 Nasal congestion: Secondary | ICD-10-CM | POA: Diagnosis not present

## 2021-10-12 DIAGNOSIS — F0150 Vascular dementia without behavioral disturbance: Secondary | ICD-10-CM | POA: Diagnosis not present

## 2021-10-19 DIAGNOSIS — R278 Other lack of coordination: Secondary | ICD-10-CM | POA: Diagnosis not present

## 2021-10-19 DIAGNOSIS — R2681 Unsteadiness on feet: Secondary | ICD-10-CM | POA: Diagnosis not present

## 2021-10-19 DIAGNOSIS — F0150 Vascular dementia without behavioral disturbance: Secondary | ICD-10-CM | POA: Diagnosis not present

## 2021-10-19 DIAGNOSIS — R0981 Nasal congestion: Secondary | ICD-10-CM | POA: Diagnosis not present

## 2021-10-19 DIAGNOSIS — M6281 Muscle weakness (generalized): Secondary | ICD-10-CM | POA: Diagnosis not present

## 2021-10-19 DIAGNOSIS — I10 Essential (primary) hypertension: Secondary | ICD-10-CM | POA: Diagnosis not present

## 2021-10-19 DIAGNOSIS — I2510 Atherosclerotic heart disease of native coronary artery without angina pectoris: Secondary | ICD-10-CM | POA: Diagnosis not present

## 2021-10-20 DIAGNOSIS — M6281 Muscle weakness (generalized): Secondary | ICD-10-CM | POA: Diagnosis not present

## 2021-10-20 DIAGNOSIS — R278 Other lack of coordination: Secondary | ICD-10-CM | POA: Diagnosis not present

## 2021-10-20 DIAGNOSIS — E43 Unspecified severe protein-calorie malnutrition: Secondary | ICD-10-CM | POA: Diagnosis not present

## 2021-10-20 DIAGNOSIS — R2681 Unsteadiness on feet: Secondary | ICD-10-CM | POA: Diagnosis not present

## 2021-10-20 DIAGNOSIS — I2510 Atherosclerotic heart disease of native coronary artery without angina pectoris: Secondary | ICD-10-CM | POA: Diagnosis not present

## 2021-10-20 DIAGNOSIS — R0981 Nasal congestion: Secondary | ICD-10-CM | POA: Diagnosis not present

## 2021-10-20 DIAGNOSIS — R296 Repeated falls: Secondary | ICD-10-CM | POA: Diagnosis not present

## 2021-10-20 DIAGNOSIS — R400 Somnolence: Secondary | ICD-10-CM | POA: Diagnosis not present

## 2021-10-20 DIAGNOSIS — I482 Chronic atrial fibrillation, unspecified: Secondary | ICD-10-CM | POA: Diagnosis not present

## 2021-10-20 DIAGNOSIS — G8929 Other chronic pain: Secondary | ICD-10-CM | POA: Diagnosis not present

## 2021-10-20 DIAGNOSIS — I10 Essential (primary) hypertension: Secondary | ICD-10-CM | POA: Diagnosis not present

## 2021-10-20 DIAGNOSIS — G4700 Insomnia, unspecified: Secondary | ICD-10-CM | POA: Diagnosis not present

## 2021-10-20 DIAGNOSIS — R634 Abnormal weight loss: Secondary | ICD-10-CM | POA: Diagnosis not present

## 2021-10-20 DIAGNOSIS — R001 Bradycardia, unspecified: Secondary | ICD-10-CM | POA: Diagnosis not present

## 2021-10-20 DIAGNOSIS — F0150 Vascular dementia without behavioral disturbance: Secondary | ICD-10-CM | POA: Diagnosis not present

## 2021-10-21 DIAGNOSIS — I2510 Atherosclerotic heart disease of native coronary artery without angina pectoris: Secondary | ICD-10-CM | POA: Diagnosis not present

## 2021-10-21 DIAGNOSIS — F0150 Vascular dementia without behavioral disturbance: Secondary | ICD-10-CM | POA: Diagnosis not present

## 2021-10-21 DIAGNOSIS — I69815 Cognitive social or emotional deficit following other cerebrovascular disease: Secondary | ICD-10-CM | POA: Diagnosis not present

## 2021-10-21 DIAGNOSIS — G4701 Insomnia due to medical condition: Secondary | ICD-10-CM | POA: Diagnosis not present

## 2021-10-21 DIAGNOSIS — R2681 Unsteadiness on feet: Secondary | ICD-10-CM | POA: Diagnosis not present

## 2021-10-21 DIAGNOSIS — M6281 Muscle weakness (generalized): Secondary | ICD-10-CM | POA: Diagnosis not present

## 2021-10-21 DIAGNOSIS — R0981 Nasal congestion: Secondary | ICD-10-CM | POA: Diagnosis not present

## 2021-10-21 DIAGNOSIS — F603 Borderline personality disorder: Secondary | ICD-10-CM | POA: Diagnosis not present

## 2021-10-21 DIAGNOSIS — F01518 Vascular dementia, unspecified severity, with other behavioral disturbance: Secondary | ICD-10-CM | POA: Diagnosis not present

## 2021-10-21 DIAGNOSIS — R451 Restlessness and agitation: Secondary | ICD-10-CM | POA: Diagnosis not present

## 2021-10-21 DIAGNOSIS — I10 Essential (primary) hypertension: Secondary | ICD-10-CM | POA: Diagnosis not present

## 2021-10-21 DIAGNOSIS — R278 Other lack of coordination: Secondary | ICD-10-CM | POA: Diagnosis not present

## 2021-10-22 DIAGNOSIS — R2681 Unsteadiness on feet: Secondary | ICD-10-CM | POA: Diagnosis not present

## 2021-10-22 DIAGNOSIS — R278 Other lack of coordination: Secondary | ICD-10-CM | POA: Diagnosis not present

## 2021-10-22 DIAGNOSIS — D529 Folate deficiency anemia, unspecified: Secondary | ICD-10-CM | POA: Diagnosis not present

## 2021-10-22 DIAGNOSIS — R0981 Nasal congestion: Secondary | ICD-10-CM | POA: Diagnosis not present

## 2021-10-22 DIAGNOSIS — D509 Iron deficiency anemia, unspecified: Secondary | ICD-10-CM | POA: Diagnosis not present

## 2021-10-22 DIAGNOSIS — I10 Essential (primary) hypertension: Secondary | ICD-10-CM | POA: Diagnosis not present

## 2021-10-22 DIAGNOSIS — I2510 Atherosclerotic heart disease of native coronary artery without angina pectoris: Secondary | ICD-10-CM | POA: Diagnosis not present

## 2021-10-22 DIAGNOSIS — M6281 Muscle weakness (generalized): Secondary | ICD-10-CM | POA: Diagnosis not present

## 2021-10-22 DIAGNOSIS — E559 Vitamin D deficiency, unspecified: Secondary | ICD-10-CM | POA: Diagnosis not present

## 2021-10-22 DIAGNOSIS — R627 Adult failure to thrive: Secondary | ICD-10-CM | POA: Diagnosis not present

## 2021-10-22 DIAGNOSIS — R7309 Other abnormal glucose: Secondary | ICD-10-CM | POA: Diagnosis not present

## 2021-10-22 DIAGNOSIS — F0150 Vascular dementia without behavioral disturbance: Secondary | ICD-10-CM | POA: Diagnosis not present

## 2021-10-23 DIAGNOSIS — I10 Essential (primary) hypertension: Secondary | ICD-10-CM | POA: Diagnosis not present

## 2021-10-23 DIAGNOSIS — R2681 Unsteadiness on feet: Secondary | ICD-10-CM | POA: Diagnosis not present

## 2021-10-23 DIAGNOSIS — I2510 Atherosclerotic heart disease of native coronary artery without angina pectoris: Secondary | ICD-10-CM | POA: Diagnosis not present

## 2021-10-23 DIAGNOSIS — R278 Other lack of coordination: Secondary | ICD-10-CM | POA: Diagnosis not present

## 2021-10-23 DIAGNOSIS — M6281 Muscle weakness (generalized): Secondary | ICD-10-CM | POA: Diagnosis not present

## 2021-10-23 DIAGNOSIS — F0150 Vascular dementia without behavioral disturbance: Secondary | ICD-10-CM | POA: Diagnosis not present

## 2021-10-23 DIAGNOSIS — R0981 Nasal congestion: Secondary | ICD-10-CM | POA: Diagnosis not present

## 2021-10-26 DIAGNOSIS — M6281 Muscle weakness (generalized): Secondary | ICD-10-CM | POA: Diagnosis not present

## 2021-10-26 DIAGNOSIS — I10 Essential (primary) hypertension: Secondary | ICD-10-CM | POA: Diagnosis not present

## 2021-10-26 DIAGNOSIS — R0981 Nasal congestion: Secondary | ICD-10-CM | POA: Diagnosis not present

## 2021-10-26 DIAGNOSIS — R278 Other lack of coordination: Secondary | ICD-10-CM | POA: Diagnosis not present

## 2021-10-26 DIAGNOSIS — F0150 Vascular dementia without behavioral disturbance: Secondary | ICD-10-CM | POA: Diagnosis not present

## 2021-10-26 DIAGNOSIS — I2510 Atherosclerotic heart disease of native coronary artery without angina pectoris: Secondary | ICD-10-CM | POA: Diagnosis not present

## 2021-10-26 DIAGNOSIS — R2681 Unsteadiness on feet: Secondary | ICD-10-CM | POA: Diagnosis not present

## 2021-10-27 DIAGNOSIS — F0150 Vascular dementia without behavioral disturbance: Secondary | ICD-10-CM | POA: Diagnosis not present

## 2021-10-27 DIAGNOSIS — I10 Essential (primary) hypertension: Secondary | ICD-10-CM | POA: Diagnosis not present

## 2021-10-27 DIAGNOSIS — R278 Other lack of coordination: Secondary | ICD-10-CM | POA: Diagnosis not present

## 2021-10-27 DIAGNOSIS — R2681 Unsteadiness on feet: Secondary | ICD-10-CM | POA: Diagnosis not present

## 2021-10-27 DIAGNOSIS — R0981 Nasal congestion: Secondary | ICD-10-CM | POA: Diagnosis not present

## 2021-10-27 DIAGNOSIS — I2510 Atherosclerotic heart disease of native coronary artery without angina pectoris: Secondary | ICD-10-CM | POA: Diagnosis not present

## 2021-10-27 DIAGNOSIS — M6281 Muscle weakness (generalized): Secondary | ICD-10-CM | POA: Diagnosis not present

## 2021-10-28 DIAGNOSIS — I10 Essential (primary) hypertension: Secondary | ICD-10-CM | POA: Diagnosis not present

## 2021-10-28 DIAGNOSIS — R5381 Other malaise: Secondary | ICD-10-CM | POA: Diagnosis not present

## 2021-10-28 DIAGNOSIS — R001 Bradycardia, unspecified: Secondary | ICD-10-CM | POA: Diagnosis not present

## 2021-10-28 DIAGNOSIS — R278 Other lack of coordination: Secondary | ICD-10-CM | POA: Diagnosis not present

## 2021-10-28 DIAGNOSIS — F0150 Vascular dementia without behavioral disturbance: Secondary | ICD-10-CM | POA: Diagnosis not present

## 2021-10-28 DIAGNOSIS — I2510 Atherosclerotic heart disease of native coronary artery without angina pectoris: Secondary | ICD-10-CM | POA: Diagnosis not present

## 2021-10-28 DIAGNOSIS — M6281 Muscle weakness (generalized): Secondary | ICD-10-CM | POA: Diagnosis not present

## 2021-10-28 DIAGNOSIS — R0981 Nasal congestion: Secondary | ICD-10-CM | POA: Diagnosis not present

## 2021-10-28 DIAGNOSIS — R2681 Unsteadiness on feet: Secondary | ICD-10-CM | POA: Diagnosis not present

## 2021-10-29 DIAGNOSIS — I2510 Atherosclerotic heart disease of native coronary artery without angina pectoris: Secondary | ICD-10-CM | POA: Diagnosis not present

## 2021-10-29 DIAGNOSIS — R278 Other lack of coordination: Secondary | ICD-10-CM | POA: Diagnosis not present

## 2021-10-29 DIAGNOSIS — R0981 Nasal congestion: Secondary | ICD-10-CM | POA: Diagnosis not present

## 2021-10-29 DIAGNOSIS — N390 Urinary tract infection, site not specified: Secondary | ICD-10-CM | POA: Diagnosis not present

## 2021-10-29 DIAGNOSIS — F0150 Vascular dementia without behavioral disturbance: Secondary | ICD-10-CM | POA: Diagnosis not present

## 2021-10-29 DIAGNOSIS — R2681 Unsteadiness on feet: Secondary | ICD-10-CM | POA: Diagnosis not present

## 2021-10-29 DIAGNOSIS — M6281 Muscle weakness (generalized): Secondary | ICD-10-CM | POA: Diagnosis not present

## 2021-10-29 DIAGNOSIS — I10 Essential (primary) hypertension: Secondary | ICD-10-CM | POA: Diagnosis not present

## 2021-10-30 DIAGNOSIS — R5381 Other malaise: Secondary | ICD-10-CM | POA: Diagnosis not present

## 2021-10-30 DIAGNOSIS — N390 Urinary tract infection, site not specified: Secondary | ICD-10-CM | POA: Diagnosis not present

## 2021-11-09 DIAGNOSIS — H2513 Age-related nuclear cataract, bilateral: Secondary | ICD-10-CM | POA: Diagnosis not present

## 2021-11-09 DIAGNOSIS — H524 Presbyopia: Secondary | ICD-10-CM | POA: Diagnosis not present

## 2021-11-17 DIAGNOSIS — I739 Peripheral vascular disease, unspecified: Secondary | ICD-10-CM | POA: Diagnosis not present

## 2021-11-17 DIAGNOSIS — B351 Tinea unguium: Secondary | ICD-10-CM | POA: Diagnosis not present

## 2021-12-04 ENCOUNTER — Non-Acute Institutional Stay: Payer: Medicare HMO | Admitting: Primary Care

## 2021-12-04 ENCOUNTER — Other Ambulatory Visit: Payer: Self-pay

## 2021-12-04 DIAGNOSIS — R259 Unspecified abnormal involuntary movements: Secondary | ICD-10-CM | POA: Diagnosis not present

## 2021-12-04 DIAGNOSIS — R29818 Other symptoms and signs involving the nervous system: Secondary | ICD-10-CM

## 2021-12-04 DIAGNOSIS — Z515 Encounter for palliative care: Secondary | ICD-10-CM | POA: Diagnosis not present

## 2021-12-04 DIAGNOSIS — F03C11 Unspecified dementia, severe, with agitation: Secondary | ICD-10-CM

## 2021-12-04 NOTE — Progress Notes (Signed)
[facility] Consult Note Telephone: [phone]  Fax: [phone]    Date of encounter: 12/04/21 11:18 AM PATIENT NAME: Tara Huffman [address] [place] [address]   [phone] (home)  DOB: 03-Aug-1941 MRN: [id number] PRIMARY CARE PROVIDER:    Rica Koyanagi, MD,  [place] [address] [phone]  REFERRING PROVIDER:   Rica Koyanagi, MD [address] [place],  [place] [address] [phone]  RESPONSIBLE PARTY:    Contact Information     Name Relation Home Work [place] Daughter [phone]  [phone]   Moore,Carolyn Sister [phone]          I met face to face with [facility] in [facility]. Palliative Care was asked to follow this patient by consultation request of  Rica Koyanagi, MD to address advance care planning and complex medical decision making. This is a follow up visit.                                   ASSESSMENT AND PLAN / RECOMMENDATIONS:   Advance Care Planning/Goals of Care: Goals include to maximize quality of life and symptom management. Patient/health care surrogate gave his/her permission to discuss.Our advance care planning conversation included a discussion about:    The value and importance of advance care planning  Experiences with loved ones who have been seriously ill or have died  Exploration of personal, cultural or spiritual beliefs that might influence medical decisions  Exploration of goals of care in the event of a sudden injury or illness  Identification of a healthcare agent  Review and updating or creation of an  advance directive document . Decision not to resuscitate or to de-escalate disease focused treatments due to poor prognosis. CODE STATUS:  Symptom Management/Plan:  Patient oob in w/c as she usually is. No report of pain or discomfort. She has been eating 25-50% and sustained 10% weight loss in 4 months. She had UTI in January, and  was treated for this at the SNF. Patient is poor historian but can report how she feels.  Wt loss is concerning and I will monitor.  Follow up Palliative Care Visit: Palliative care will continue to follow for complex medical decision making, advance care planning, and clarification of goals. Return 4 weeks or prn.  I spent 15 minutes providing this consultation. More than 50% of the time in this consultation was spent in counseling and care coordination.  PPS: 40%  HOSPICE ELIGIBILITY/DIAGNOSIS: TBD  Chief Complaint: debility  HISTORY OF PRESENT ILLNESS:  Tara Huffman is a 81 y.o. year old female  with dementia, debility, wt loss and immobility .   History obtained from review of EMR, discussion with primary team, and interview with family, facility staff/caregiver and/or Ms. Anastasia.  I reviewed available labs, medications, imaging, studies and related documents from the EMR.  Records reviewed and summarized above.   ROS/staff   General: NAD ENMT: denies dysphagia Cardiovascular:  denies DOE Pulmonary: denies cough, denies increased SOB Abdomen: endorses good appetite, denies constipation, endorses incontinence of bowel GU: denies dysuria, endorses incontinence of urine MSK:  denies  increased weakness,  no falls reported Skin: denies rashes or wounds Neurological: denies pain, denies insomnia Psych: Endorses flat mood Heme/lymph/immuno: denies bruises, abnormal bleeding  Physical Exam: Current and past weights: 102 lbs now, 113 lbs in 10/22, this is an 11 lbs loss or  10% Constitutional: NAD General: frail appearing, thin/ EYES: anicteric sclera, lids intact, no discharge  ENMT: intact hearing, oral mucous membranes moist, CV: no LE edema Pulmonary: no increased work of breathing, no cough, room air Abdomen: intake 50%,  no ascites GU: deferred MSK: + sarcopenia, moves all extremities,  non ambulatory, oob in w/c Skin: warm and dry, no rashes or wounds on visible  skin Neuro:  + generalized weakness,  severe  cognitive impairment Psych: slight anxious affect, A and O x 1 Hem/lymph/immuno: no widespread bruising  Outpatient Encounter Medications as of 12/04/2021  Medication Sig   acetaminophen (TYLENOL) 500 MG tablet Take 1,000 mg by mouth in the morning, at noon, and at bedtime.   Cholecalciferol (VITAMIN D-3) 125 MCG (5000 UT) TABS Take 1 tablet by mouth daily.   clonazePAM (KLONOPIN) 0.5 MG tablet Take 0.25 mg by mouth in the morning and at bedtime.   diclofenac Sodium (VOLTAREN) 1 % GEL Apply 1 application topically 4 (four) times daily as needed.   fluticasone (FLONASE) 50 MCG/ACT nasal spray SPRAY 2 SPRAY IN EACH NOSTRIL DAILY (Patient taking differently: Place 2 sprays into both nostrils daily. SPRAY 2 SPRAY IN EACH NOSTRIL DAILY)   Multiple Vitamin (MULTIVITAMIN) tablet Take 1 tablet by mouth daily.   polyethylene glycol (MIRALAX / GLYCOLAX) 17 g packet Take 17 g by mouth daily.   propranolol (INDERAL) 40 MG tablet Take 40 mg by mouth 2 (two) times daily.   risperiDONE (RISPERDAL) 1 MG tablet Take 1 mg by mouth 2 (two) times daily.   senna (SENOKOT) 8.6 MG tablet Take 2 tablets by mouth 2 (two) times daily.   traZODone (DESYREL) 50 MG tablet Take 50 mg by mouth at bedtime.   [DISCONTINUED] cetirizine (ZYRTEC) 5 MG tablet Take 10 mg by mouth daily.   [DISCONTINUED] LORazepam (ATIVAN) 0.5 MG tablet Take 1 tablet by mouth 2 (two) times daily.   [DISCONTINUED] melatonin 3 MG TABS tablet Take 3 mg by mouth at bedtime.   [DISCONTINUED] traMADol (ULTRAM) 50 MG tablet Take 50 mg by mouth every 4 (four) hours as needed.   No facility-administered encounter medications on file as of 12/04/2021.      Thank you for the opportunity to participate in the care of Ms. Vaca.  The palliative care team will continue to follow. Please call our office at [phone] if we can be of additional assistance.   Jason Coop, NP DNP,  AGPCNP-BC  COVID-19 PATIENT SCREENING TOOL Asked and negative response unless otherwise noted:   Have you had symptoms of covid, tested positive or been in contact with someone with symptoms/positive test in the past 5-10 days?

## 2021-12-09 DIAGNOSIS — F01518 Vascular dementia, unspecified severity, with other behavioral disturbance: Secondary | ICD-10-CM | POA: Diagnosis not present

## 2021-12-09 DIAGNOSIS — I69815 Cognitive social or emotional deficit following other cerebrovascular disease: Secondary | ICD-10-CM | POA: Diagnosis not present

## 2021-12-09 DIAGNOSIS — F603 Borderline personality disorder: Secondary | ICD-10-CM | POA: Diagnosis not present

## 2021-12-09 DIAGNOSIS — G4701 Insomnia due to medical condition: Secondary | ICD-10-CM | POA: Diagnosis not present

## 2021-12-15 DIAGNOSIS — R627 Adult failure to thrive: Secondary | ICD-10-CM | POA: Diagnosis not present

## 2021-12-15 DIAGNOSIS — I482 Chronic atrial fibrillation, unspecified: Secondary | ICD-10-CM | POA: Diagnosis not present

## 2021-12-15 DIAGNOSIS — M6281 Muscle weakness (generalized): Secondary | ICD-10-CM | POA: Diagnosis not present

## 2021-12-15 DIAGNOSIS — F01C18 Vascular dementia, severe, with other behavioral disturbance: Secondary | ICD-10-CM | POA: Diagnosis not present

## 2021-12-23 DIAGNOSIS — R627 Adult failure to thrive: Secondary | ICD-10-CM | POA: Diagnosis not present

## 2021-12-23 DIAGNOSIS — M6281 Muscle weakness (generalized): Secondary | ICD-10-CM | POA: Diagnosis not present

## 2021-12-23 DIAGNOSIS — F01C18 Vascular dementia, severe, with other behavioral disturbance: Secondary | ICD-10-CM | POA: Diagnosis not present

## 2021-12-24 DIAGNOSIS — M6281 Muscle weakness (generalized): Secondary | ICD-10-CM | POA: Diagnosis not present

## 2021-12-24 DIAGNOSIS — F01C18 Vascular dementia, severe, with other behavioral disturbance: Secondary | ICD-10-CM | POA: Diagnosis not present

## 2022-01-06 DIAGNOSIS — G4701 Insomnia due to medical condition: Secondary | ICD-10-CM | POA: Diagnosis not present

## 2022-01-06 DIAGNOSIS — F603 Borderline personality disorder: Secondary | ICD-10-CM | POA: Diagnosis not present

## 2022-01-06 DIAGNOSIS — F01518 Vascular dementia, unspecified severity, with other behavioral disturbance: Secondary | ICD-10-CM | POA: Diagnosis not present

## 2022-01-06 DIAGNOSIS — I69815 Cognitive social or emotional deficit following other cerebrovascular disease: Secondary | ICD-10-CM | POA: Diagnosis not present

## 2022-01-11 DIAGNOSIS — I10 Essential (primary) hypertension: Secondary | ICD-10-CM | POA: Diagnosis not present

## 2022-01-11 DIAGNOSIS — M6281 Muscle weakness (generalized): Secondary | ICD-10-CM | POA: Diagnosis not present

## 2022-01-11 DIAGNOSIS — I2510 Atherosclerotic heart disease of native coronary artery without angina pectoris: Secondary | ICD-10-CM | POA: Diagnosis not present

## 2022-01-11 DIAGNOSIS — F0150 Vascular dementia without behavioral disturbance: Secondary | ICD-10-CM | POA: Diagnosis not present

## 2022-01-11 DIAGNOSIS — R2681 Unsteadiness on feet: Secondary | ICD-10-CM | POA: Diagnosis not present

## 2022-01-11 DIAGNOSIS — F0390 Unspecified dementia without behavioral disturbance: Secondary | ICD-10-CM | POA: Diagnosis not present

## 2022-01-11 DIAGNOSIS — M6259 Muscle wasting and atrophy, not elsewhere classified, multiple sites: Secondary | ICD-10-CM | POA: Diagnosis not present

## 2022-01-11 DIAGNOSIS — R0981 Nasal congestion: Secondary | ICD-10-CM | POA: Diagnosis not present

## 2022-01-13 DIAGNOSIS — F0390 Unspecified dementia without behavioral disturbance: Secondary | ICD-10-CM | POA: Diagnosis not present

## 2022-01-13 DIAGNOSIS — R2681 Unsteadiness on feet: Secondary | ICD-10-CM | POA: Diagnosis not present

## 2022-01-13 DIAGNOSIS — M6259 Muscle wasting and atrophy, not elsewhere classified, multiple sites: Secondary | ICD-10-CM | POA: Diagnosis not present

## 2022-01-13 DIAGNOSIS — I10 Essential (primary) hypertension: Secondary | ICD-10-CM | POA: Diagnosis not present

## 2022-01-13 DIAGNOSIS — R0981 Nasal congestion: Secondary | ICD-10-CM | POA: Diagnosis not present

## 2022-01-13 DIAGNOSIS — F0150 Vascular dementia without behavioral disturbance: Secondary | ICD-10-CM | POA: Diagnosis not present

## 2022-01-13 DIAGNOSIS — I2510 Atherosclerotic heart disease of native coronary artery without angina pectoris: Secondary | ICD-10-CM | POA: Diagnosis not present

## 2022-01-13 DIAGNOSIS — M6281 Muscle weakness (generalized): Secondary | ICD-10-CM | POA: Diagnosis not present

## 2022-01-14 DIAGNOSIS — R0981 Nasal congestion: Secondary | ICD-10-CM | POA: Diagnosis not present

## 2022-01-14 DIAGNOSIS — R2681 Unsteadiness on feet: Secondary | ICD-10-CM | POA: Diagnosis not present

## 2022-01-14 DIAGNOSIS — M6281 Muscle weakness (generalized): Secondary | ICD-10-CM | POA: Diagnosis not present

## 2022-01-14 DIAGNOSIS — F0390 Unspecified dementia without behavioral disturbance: Secondary | ICD-10-CM | POA: Diagnosis not present

## 2022-01-14 DIAGNOSIS — M6259 Muscle wasting and atrophy, not elsewhere classified, multiple sites: Secondary | ICD-10-CM | POA: Diagnosis not present

## 2022-01-14 DIAGNOSIS — I2510 Atherosclerotic heart disease of native coronary artery without angina pectoris: Secondary | ICD-10-CM | POA: Diagnosis not present

## 2022-01-14 DIAGNOSIS — F0150 Vascular dementia without behavioral disturbance: Secondary | ICD-10-CM | POA: Diagnosis not present

## 2022-01-14 DIAGNOSIS — I10 Essential (primary) hypertension: Secondary | ICD-10-CM | POA: Diagnosis not present

## 2022-01-15 DIAGNOSIS — F0150 Vascular dementia without behavioral disturbance: Secondary | ICD-10-CM | POA: Diagnosis not present

## 2022-01-15 DIAGNOSIS — I10 Essential (primary) hypertension: Secondary | ICD-10-CM | POA: Diagnosis not present

## 2022-01-15 DIAGNOSIS — R0981 Nasal congestion: Secondary | ICD-10-CM | POA: Diagnosis not present

## 2022-01-15 DIAGNOSIS — M6259 Muscle wasting and atrophy, not elsewhere classified, multiple sites: Secondary | ICD-10-CM | POA: Diagnosis not present

## 2022-01-15 DIAGNOSIS — R2681 Unsteadiness on feet: Secondary | ICD-10-CM | POA: Diagnosis not present

## 2022-01-15 DIAGNOSIS — M6281 Muscle weakness (generalized): Secondary | ICD-10-CM | POA: Diagnosis not present

## 2022-01-15 DIAGNOSIS — I2510 Atherosclerotic heart disease of native coronary artery without angina pectoris: Secondary | ICD-10-CM | POA: Diagnosis not present

## 2022-01-15 DIAGNOSIS — F0390 Unspecified dementia without behavioral disturbance: Secondary | ICD-10-CM | POA: Diagnosis not present

## 2022-03-17 ENCOUNTER — Emergency Department
Admission: EM | Admit: 2022-03-17 | Discharge: 2022-03-17 | Disposition: A | Discharge status: Skilled Nursing Facility | Payer: Medicare HMO | Attending: Emergency Medicine | Admitting: Emergency Medicine

## 2022-03-17 ENCOUNTER — Emergency Department: Payer: Medicare HMO

## 2022-03-17 ENCOUNTER — Other Ambulatory Visit: Payer: Self-pay

## 2022-03-17 DIAGNOSIS — M79605 Pain in left leg: Secondary | ICD-10-CM | POA: Diagnosis present

## 2022-03-17 DIAGNOSIS — I10 Essential (primary) hypertension: Secondary | ICD-10-CM | POA: Insufficient documentation

## 2022-03-17 DIAGNOSIS — S82202A Unspecified fracture of shaft of left tibia, initial encounter for closed fracture: Secondary | ICD-10-CM | POA: Insufficient documentation

## 2022-03-17 DIAGNOSIS — I4891 Unspecified atrial fibrillation: Secondary | ICD-10-CM | POA: Insufficient documentation

## 2022-03-17 DIAGNOSIS — F0390 Unspecified dementia without behavioral disturbance: Secondary | ICD-10-CM | POA: Diagnosis not present

## 2022-03-17 DIAGNOSIS — W230XXA Caught, crushed, jammed, or pinched between moving objects, initial encounter: Secondary | ICD-10-CM | POA: Diagnosis not present

## 2022-03-17 DIAGNOSIS — S8992XA Unspecified injury of left lower leg, initial encounter: Secondary | ICD-10-CM | POA: Diagnosis present

## 2022-03-17 DIAGNOSIS — S82402A Unspecified fracture of shaft of left fibula, initial encounter for closed fracture: Secondary | ICD-10-CM | POA: Insufficient documentation

## 2022-03-17 NOTE — ED Provider Triage Note (Signed)
Emergency Medicine Provider Triage Evaluation Note  DOCIE ABRAMOVICH , a 81 y.o. female  was evaluated in triage.  Pt complains of left leg pain.  Patient arrived via [facility] from [facility].  Apparently patient got her leg stuck in a wheelchair yesterday.  [facility] states it had a deformity.  Review of Systems  Positive: See above Negative:   Physical Exam  LMP 05/20/1994 (Approximate) Comment: age 83 Gen:   Awake, no distress   Resp:  Normal effort  MSK:   Moves extremities without difficulty, left tib-fib is warm and tender distally Other:    Medical Decision Making  Medically screening exam initiated at 11:54 AM.  Appropriate orders placed.  [place] TAMIRRA SIENKIEWICZ was informed that the remainder of the evaluation will be completed by another provider, this initial triage assessment does not replace that evaluation, and the importance of remaining in the ED until their evaluation is complete.  X-ray left tib-fib ordered   Faythe Ghee, PA-C 03/17/22 1156

## 2022-03-17 NOTE — ED Notes (Signed)
Report called to [facility]. ?

## 2022-03-17 NOTE — ED Provider Notes (Signed)
[facility] Provider Note    Event Date/Time   First MD Initiated Contact with Patient 03/17/22 1246     (approximate)   History   Chief Complaint: Leg Injury   HPI  Tara Huffman is a 81 y.o. female with a past history of hypertension advanced dementia atrial fibrillation who is sent to the ED from [facility] due to an injury yesterday where the patient's left leg got caught in the wheelchair.  Today they noticed redness and swelling in the area along with pain to touch.  No fall, no other injury.  No loss of consciousness.  Patient does not relate any other symptoms.     Physical Exam   Triage Vital Signs: ED Triage Vitals  Enc Vitals Group     BP 03/17/22 1159 (!) 126/58     Pulse Rate 03/17/22 1159 (!) 56     Resp 03/17/22 1159 16     Temp 03/17/22 1159 98.5 F (36.9 C)     Temp Source 03/17/22 1159 Oral     SpO2 03/17/22 1159 99 %     Weight 03/17/22 1200 140 lb (63.5 kg)     Height 03/17/22 1200 5\' 4"  (1.626 m)     Head Circumference --      [facility] --      Pain Score --      Pain Loc --      Pain Edu? --      Excl. in [place]? --     Most recent vital signs: Vitals:   03/17/22 1159  BP: (!) 126/58  Pulse: (!) 56  Resp: 16  Temp: 98.5 F (36.9 C)  SpO2: 99%    General: Awake, no distress.  CV:  Good peripheral perfusion.  Resp:  Normal effort.  Abd:  No distention.  Other:  Tenderness over distal tib-fib.  No open wounds.  Joint stable.   ED Results / Procedures / Treatments   Labs (all labs ordered are listed, but only abnormal results are displayed) Labs Reviewed - No data to display   EKG    RADIOLOGY X-ray left tib-fib viewed and interpreted by me, shows nondisplaced fracture of the left both bones.  Radiology report reviewed.   PROCEDURES:  .Ortho Injury Treatment  Date/Time: 03/17/2022 2:50 PM Performed by: Carrie Mew, MD Authorized by: Carrie Mew, MD   Consent:    Consent  obtained:  Emergent situation   Risks discussed:  Fracture   Alternatives discussed:  No treatmentInjury location: lower leg Location details: left lower leg Injury type: fracture Fracture type: tibial and fibular shafts Pre-procedure neurovascular assessment: neurovascularly intact Pre-procedure distal perfusion: normal Pre-procedure neurological function: normal Pre-procedure range of motion: normal  Anesthesia: Local anesthesia used: no  Patient sedated: NoManipulation performed: no Immobilization: splint Splint type: short leg Splint Applied by: ED Nurse and ED Provider Supplies used: cotton padding, elastic bandage and Ortho-Glass Post-procedure neurovascular assessment: post-procedure neurovascularly intact Post-procedure distal perfusion: normal Post-procedure neurological function: normal Post-procedure range of motion: normal     MEDICATIONS ORDERED IN ED: Medications - No data to display   IMPRESSION / MDM / [place] / ED COURSE  I reviewed the triage vital signs and the nursing notes.                              Differential diagnosis includes, but is not limited to, tibia fracture, fibula fracture, contusion.  Doubt soft tissue infection, cancer, septic arthritis, ankle fracture  Patient's presentation is most consistent with acute illness / injury with system symptoms.  Patient presents with left leg pain after blunt trauma to the midshaft left lower leg.  X-ray shows nondisplaced fracture of tibia and fibula.  This was splinted in the ED.  Follow-up with orthopedics.       FINAL CLINICAL IMPRESSION(S) / ED DIAGNOSES   Final diagnoses:  Closed fracture of left tibia and fibula, initial encounter     Rx / DC Orders   ED Discharge Orders     None        Note:  This document was prepared using Dragon voice recognition software and may include unintentional dictation errors.   Carrie Mew, MD 03/17/22 1450

## 2022-03-17 NOTE — ED Triage Notes (Signed)
Pt arrives via ems from [facility], ems reports that the pt's leg was caught in the wheelchair yesterday and appears and red and swollen today, pt has warmth to the lower leg and seems tender to the leg with light touch

## 2022-03-17 NOTE — ED Notes (Signed)
Called [facility] for transport to [facility]  1500

## 2022-03-17 NOTE — ED Notes (Signed)
Pt sent from [facility] after getting her left ankle caught in a wheel chair yesterday. Noted swelling to the ankle and proximal to the ankle. Pt denies pain at present, only with attempts to bare weight or with touch. 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 No skin brake down on arrival, scars noted to the sacral area from previous pressure wounds

## 2022-03-17 NOTE — Discharge Instructions (Signed)
You have a nondisplaced fracture in the lower part of the left tibia and fibula.  We have splinted this area to protect it.  Please follow-up with orthopedics in 1 week.  Keep the splint dry.  If it becomes wet, it will soften and no longer function as needed.

## 2022-04-20 ENCOUNTER — Non-Acute Institutional Stay: Payer: Medicare HMO | Admitting: Primary Care

## 2022-04-20 DIAGNOSIS — W19XXXD Unspecified fall, subsequent encounter: Secondary | ICD-10-CM

## 2022-04-20 DIAGNOSIS — F03C11 Unspecified dementia, severe, with agitation: Secondary | ICD-10-CM

## 2022-04-20 DIAGNOSIS — Z515 Encounter for palliative care: Secondary | ICD-10-CM

## 2022-04-20 DIAGNOSIS — R259 Unspecified abnormal involuntary movements: Secondary | ICD-10-CM

## 2022-04-20 DIAGNOSIS — R29818 Other symptoms and signs involving the nervous system: Secondary | ICD-10-CM

## 2022-04-20 NOTE — Progress Notes (Signed)
[facility] Consult Note Telephone: [phone]  Fax: [phone]    Date of encounter: 04/20/22 1:18 PM PATIENT NAME: Tara Huffman [address] [place] [address]   [phone] (home)  DOB: 04-Jul-1941 MRN: [id number] PRIMARY CARE PROVIDER:    Rica Koyanagi, MD,  [place] [address] [phone]  REFERRING PROVIDER:   Rica Koyanagi, MD [address] [place],  [place] [address] [phone]  RESPONSIBLE PARTY:    Contact Information     Name Relation Home Work Tara Huffman Daughter [phone]  [phone]   Tara Huffman,Tara Huffman Sister [phone]         I met face to face with patient  in [facility]. Palliative Care was asked to follow this patient by consultation request of  Tara Koyanagi, MD to address advance care planning and complex medical decision making. This is a follow up visit.                                   ASSESSMENT AND PLAN / RECOMMENDATIONS:   Advance Care Planning/Goals of Care: Goals include to maximize quality of life and symptom management. Patient/health care surrogate gave his/her permission to discuss.Our advance care planning conversation included a discussion about:     Exploration of personal, cultural or spiritual beliefs that might influence medical decisions  Identification of a healthcare agent - daughter Tara Huffman Review of an  advance directive document . CODE STATUS: DNR  Symptom Management/Plan:  I met with patient in her nursing home. She has sustained a fracture of her tibia fibula due to the wheelchair mishap. Later discussed with her daughter. Patient is currently in a cast and does not have pain. She's being fed today  by a staff member who states that her appetite is good to fair. Her weight or staying roughly stable Fluctuating several pounds only. She is alert and able to interact although does not have a sense of time or place. Daughter states she is  contented with her care and happy that she will soon be having her 81st birthday.   Follow up Palliative Care Visit: Palliative care will continue to follow for complex medical decision making, advance care planning, and clarification of goals. Return 12 weeks or prn.  I spent 15 minutes providing this consultation. More than 50% of the time in this consultation was spent in counseling and care coordination.  PPS: 30%  HOSPICE ELIGIBILITY/DIAGNOSIS: TBD  Chief Complaint: debility  HISTORY OF PRESENT ILLNESS:  Tara Huffman is a 81 y.o. year old female  with dementia, immobility recent fx of L tib/fib fx . Patient seen today to review palliative care needs to include medical decision making and advance care planning as appropriate.   History obtained from review of EMR, discussion with primary team, and interview with family, facility staff/caregiver and/or Ms. Vicknair.  I reviewed available labs, medications, imaging, studies and related documents from the EMR.  Records reviewed and summarized above.   ROS/staff   General: NAD ENMT: denies dysphagia Pulmonary: denies cough, denies increased SOB Abdomen: endorses fair  appetite, denies constipation, endorses incontinence of bowel GU: denies dysuria, endorses incontinence of urine MSK:  denies  increased weakness,  no falls reported recently Skin: denies rashes or wounds Neurological: denies pain, denies insomnia Psych: Endorses positive mood  Physical Exam: Current and past weights:98 lbs Constitutional: NAD General: frail appearing, thin  EYES: anicteric sclera, lids intact, no discharge  ENMT: intact hearing, oral mucous membranes moist, dentition intact Pulmonary:, no increased work of breathing, no cough, room air Abdomen: intake 50%, no ascites MSK: + sarcopenia, moves all extremities, non ambulatory, L LE casted. Skin: warm and dry, no rashes or wounds on visible skin Neuro:  no new  generalized weakness, + cognitive  impairment, non-anxious affect, FAST score 7 A   Thank you for the opportunity to participate in the care of Tara Huffman.  The palliative care team will continue to follow. Please call our office at [phone] if we can be of additional assistance.   Tara Coop, NP DNP, AGPCNP-BC  COVID-19 PATIENT SCREENING TOOL Asked and negative response unless otherwise noted:   Have you had symptoms of covid, tested positive or been in contact with someone with symptoms/positive test in the past 5-10 days?

## 2022-06-20 ENCOUNTER — Emergency Department: Payer: Medicare HMO

## 2022-06-20 ENCOUNTER — Emergency Department
Admission: EM | Admit: 2022-06-20 | Discharge: 2022-06-21 | Disposition: A | Discharge status: Other Acute Inpatient Hospital | Payer: Medicare HMO | Attending: Emergency Medicine | Admitting: Emergency Medicine

## 2022-06-20 ENCOUNTER — Encounter: Payer: Self-pay | Admitting: Radiology

## 2022-06-20 ENCOUNTER — Other Ambulatory Visit: Payer: Self-pay

## 2022-06-20 DIAGNOSIS — F0390 Unspecified dementia without behavioral disturbance: Secondary | ICD-10-CM | POA: Diagnosis not present

## 2022-06-20 DIAGNOSIS — R55 Syncope and collapse: Secondary | ICD-10-CM

## 2022-06-20 DIAGNOSIS — W19XXXA Unspecified fall, initial encounter: Secondary | ICD-10-CM | POA: Insufficient documentation

## 2022-06-20 DIAGNOSIS — Z20822 Contact with and (suspected) exposure to covid-19: Secondary | ICD-10-CM | POA: Insufficient documentation

## 2022-06-20 LAB — CBC WITH DIFFERENTIAL/PLATELET
Abs Immature Granulocytes: 0.02 10*3/uL (ref 0.00–0.07)
Basophils Absolute: 0 10*3/uL (ref 0.0–0.1)
Basophils Relative: 1 %
Eosinophils Absolute: 0.1 10*3/uL (ref 0.0–0.5)
Eosinophils Relative: 1 %
HCT: 44.6 % (ref 36.0–46.0)
Hemoglobin: 13.3 g/dL (ref 12.0–15.0)
Immature Granulocytes: 0 %
Lymphocytes Relative: 29 %
Lymphs Abs: 2.1 10*3/uL (ref 0.7–4.0)
MCH: 28.7 pg (ref 26.0–34.0)
MCHC: 29.8 g/dL — ABNORMAL LOW (ref 30.0–36.0)
MCV: 96.3 fL (ref 80.0–100.0)
Monocytes Absolute: 0.6 10*3/uL (ref 0.1–1.0)
Monocytes Relative: 8 %
Neutro Abs: 4.3 10*3/uL (ref 1.7–7.7)
Neutrophils Relative %: 61 %
Platelets: 286 10*3/uL (ref 150–400)
RBC: 4.63 MIL/uL (ref 3.87–5.11)
RDW: 11.9 % (ref 11.5–15.5)
WBC: 7.1 10*3/uL (ref 4.0–10.5)
nRBC: 0 % (ref 0.0–0.2)

## 2022-06-20 LAB — SARS CORONAVIRUS 2 BY RT PCR: SARS Coronavirus 2 by RT PCR: NEGATIVE

## 2022-06-20 LAB — COMPREHENSIVE METABOLIC PANEL
ALT: 9 U/L (ref 0–44)
AST: 20 U/L (ref 15–41)
Albumin: 3.4 g/dL — ABNORMAL LOW (ref 3.5–5.0)
Alkaline Phosphatase: 59 U/L (ref 38–126)
Anion gap: 10 (ref 5–15)
BUN: 18 mg/dL (ref 8–23)
CO2: 26 mmol/L (ref 22–32)
Calcium: 9.1 mg/dL (ref 8.9–10.3)
Chloride: 102 mmol/L (ref 98–111)
Creatinine, Ser: 0.61 mg/dL (ref 0.44–1.00)
GFR, Estimated: >60 mL/min (ref >60)
Glucose, Bld: 103 mg/dL — ABNORMAL HIGH (ref 70–99)
Potassium: 3.8 mmol/L (ref 3.5–5.1)
Sodium: 138 mmol/L (ref 135–145)
Total Bilirubin: 0.7 mg/dL (ref 0.3–1.2)
Total Protein: 6.8 g/dL (ref 6.5–8.1)

## 2022-06-20 LAB — TROPONIN I (HIGH SENSITIVITY): Troponin I (High Sensitivity): 4 ng/L (ref <18)

## 2022-06-20 MED ORDER — SODIUM CHLORIDE 0.9 % IV BOLUS
250.0000 mL | Freq: Once | INTRAVENOUS | Status: AC
  Administered 2022-06-20: 250 mL via INTRAVENOUS

## 2022-06-20 NOTE — Discharge Instructions (Signed)
Your work-up was reassuring overall I discussed with patient's daughter and patient did not want to stay in the hospital and she was back to her baseline self and did not want any further testing done which daughter was okay with.  However her CT head was negative x-rays negative COVID was negative and everything was reassuring to go back to facility

## 2022-06-20 NOTE — ED Notes (Signed)
Lab called to request troponin draw.

## 2022-06-20 NOTE — ED Notes (Signed)
Per pt grandson in room, pt has vascular dementia and is kept in geri chair at [facility] and can stand and pivot at baseline.  Pt has been at [facility] in [place]  approx 2 years.

## 2022-06-20 NOTE — ED Notes (Signed)
Grandson remains in room at this time.

## 2022-06-20 NOTE — ED Notes (Signed)
Pt grandson in room at this time.

## 2022-06-20 NOTE — ED Notes (Signed)
Pt taken to CT at this time.

## 2022-06-20 NOTE — ED Provider Notes (Signed)
[facility] Provider Note    Event Date/Time   First MD Initiated Contact with Patient 06/20/22 1953     (approximate)   History   Near Syncope   HPI  Tara Huffman is a 81 y.o. female  dementia, afib, who comes in with concerns for near syncope. Pt had syncope vs near syncope- did not fall out of chair or hit head. Acting normal self now.    8:33 PM [facility] come from [facility] in assisted living- Tara Huffman-- fell forward while eating and loss consciousness.  She was in the wheelchair. No seizure activity. Person who saw it is not there anymore but after she was acting her normal self per that person. Tara Huffman daughter helps make medical decisions for her. Wheelchair bound at baseline.      Physical Exam   Triage Vital Signs: ED Triage Vitals  Enc Vitals Group     BP 06/20/22 1950 (!) 125/107     Pulse Rate 06/20/22 1950 (!) 49     Resp 06/20/22 1950 16     Temp 06/20/22 1950 97.7 Huffman (36.5 C)     Temp Source 06/20/22 1950 Axillary     SpO2 06/20/22 1945 96 %     Weight 06/20/22 1951 102 lb 4.7 oz (46.4 kg)     Height 06/20/22 1951 5\' 4"  (1.626 m)     Head Circumference --      [facility] Flow --      Pain Score 06/20/22 1951 0     Pain Loc --      Pain Edu? --      Excl. in GC? --     Most recent vital signs: Vitals:   06/20/22 1945 06/20/22 1950  BP:  (!) 125/107  Pulse:  (!) 49  Resp:  16  Temp:  97.7 Huffman (36.5 C)  SpO2: 96% 93%     General: Awake, no distress.  CV:  Good peripheral perfusion.  Resp:  Normal effort.  Abd:  No distention. Soft and non tender  Other:  Pt is moving all extremities, equal strength in arms and legs.   ED Results / Procedures / Treatments   Labs (all labs ordered are listed, but only abnormal results are displayed) Labs Reviewed  CBC WITH DIFFERENTIAL/PLATELET - Abnormal; Notable for the following components:      Result Value   MCHC 29.8 (*)    All other components within normal limits   COMPREHENSIVE METABOLIC PANEL - Abnormal; Notable for the following components:   Glucose, Bld 103 (*)    Albumin 3.4 (*)    All other components within normal limits  SARS CORONAVIRUS 2 BY RT PCR  URINALYSIS, ROUTINE W REFLEX MICROSCOPIC  TROPONIN I (HIGH SENSITIVITY)  TROPONIN I (HIGH SENSITIVITY)     EKG  My interpretation of EKG:  Sinus bradycardia rate of 49 without any ST elevation, T wave version aVL, normal intervals  RADIOLOGY I have reviewed the xray personally and a interpreted no pneumonia PROCEDURES:  Critical Care performed: No  Procedures   MEDICATIONS ORDERED IN ED: Medications - No data to display   IMPRESSION / MDM / ASSESSMENT AND PLAN / ED COURSE  I reviewed the triage vital signs and the nursing notes.   Patient's presentation is most consistent with acute presentation with potential threat to life or bodily function.   Differential for syncopal episode includes vasovagal, COVID, UTI, Electra abnormalities, UTI, ACS. Does not sound like seizure. No  focal deficit now to suggest stroke.   COVID test negative CBC reassuring CMP reassuring troponin is negative we will get a repeat  Patient comes in bradycardic but I reviewed her prior visits that she typically runs in the 50s when she was here last time in May 2023  Discussed with patient's grandson who is at bedside and she is acting her normal self.   Discussed with patient's POA Tara Huffman who we discussed admission versus going home and goals of care patient is DNR and we had a recent echo and does not feel like she would want admission unless necessary.  Patient is now declining troponin and urine test and family does not want to force this upon her.  They are okay with patient going back to facility and they can return if symptoms are recurrent   CT head negative acute   The patient is on the cardiac monitor to evaluate for evidence of arrhythmia and/or significant heart rate  changes.     FINAL CLINICAL IMPRESSION(S) / ED DIAGNOSES   Final diagnoses:  Near syncope     Rx / DC Orders   ED Discharge Orders     None        Note:  This document was prepared using Dragon voice recognition software and may include unintentional dictation errors.   Concha Se, MD 06/20/22 [phone]

## 2022-06-20 NOTE — ED Notes (Signed)
Pt refused blood draw and in and out catheterization.  EDP [place]ke notified.

## 2022-06-20 NOTE — ED Triage Notes (Signed)
Pt arrives via [facility] from [facility].  Facility report snear syncope episode lasting approx 1 minute during mealtime this evening.  Pt is confused at baseline per EMS. Pt calm, NAD, A&O x3 at this time.

## 2022-07-16 ENCOUNTER — Non-Acute Institutional Stay: Payer: Medicare HMO | Admitting: Primary Care

## 2022-07-16 DIAGNOSIS — F03C11 Unspecified dementia, severe, with agitation: Secondary | ICD-10-CM

## 2022-07-16 DIAGNOSIS — R5381 Other malaise: Secondary | ICD-10-CM

## 2022-07-16 DIAGNOSIS — Z515 Encounter for palliative care: Secondary | ICD-10-CM

## 2022-07-16 NOTE — Progress Notes (Signed)
[facility] Consult Note Telephone: [phone]  Fax: [phone]    Date of encounter: 07/16/22 12:42 PM PATIENT NAME: Tara Huffman [address] [place] [address]   [phone] (home)  DOB: Sep 28, 1941 MRN: [id number] PRIMARY CARE PROVIDER:    Rica Koyanagi, MD,  [place] [address] [phone]  REFERRING PROVIDER:   Rica Koyanagi, MD [address] [place],  [place] [address] [phone]  RESPONSIBLE PARTY:    Contact Information     Name Relation Home Work [place] Daughter [phone]  [phone]   Moore,Carolyn Sister [phone]          I met face to face with patient in [facility] facility. Palliative Care was asked to follow this patient by consultation request of  Rica Koyanagi, MD to address advance care planning and complex medical decision making. This is a follow up visit.                                   ASSESSMENT AND PLAN / RECOMMENDATIONS:   Advance Care Planning/Goals of Care: Goals include to maximize quality of life and symptom management.  CODE STATUS:DNR  Symptom Management/Plan:  Met with patient in SNF, up in chair. She voices being tired. She has just gotten up.  UTI:  Patient had fall 2 days ago, and u/a was sent with some growth. Started on pyridium. I would recommend Rx x 3 days as pt reports malaise once culture is in.   Nutrition -Weight is stable at 100 lbs, slight gain. Eating 50%.   Meds, labs reviewed  Follow up Palliative Care Visit: Palliative care will continue to follow for complex medical decision making, advance care planning, and clarification of goals. Return 6 weeks or prn.   This visit was coded based on medical decision making (MDM).  PPS: 40%  HOSPICE ELIGIBILITY/DIAGNOSIS: TBD  Chief Complaint: malaise  HISTORY OF PRESENT ILLNESS:  Tara Huffman is a 81 y.o. year old female  with dementia, malaise . Patient seen today to  review palliative care needs to include medical decision making and advance care planning as appropriate.   History obtained from review of EMR, discussion with primary team, and interview with family, facility staff/caregiver and/or Ms. Antillon.  I reviewed available labs, medications, imaging, studies and related documents from the EMR.  Records reviewed and summarized above.   ROS  General: NAD ENMT: denies dysphagia Cardiovascular: denies chest pain, denies DOE Pulmonary: denies cough, denies increased SOB Abdomen: endorses fair  appetite, denies constipation, endorses incontinence of bowel GU: denies dysuria, endorses incontinence of urine MSK:  endorses  increased weakness,  +falls reported Skin: denies rashes or wounds Neurological: denies pain, denies insomnia Psych: Endorses flat mood  Physical Exam: Current and past weights: 100 lbs, gain Constitutional:ill appearing , afebrile General: frail appearing, thin EYES: anicteric sclera, lids intact, no discharge  ENMT: intact hearing, oral mucous membranes moist CV: RRR, no LE edema Pulmonary: LCTA, no increased work of breathing, no cough, room air Abdomen: intake 50%,no ascites MSK: severe sarcopenia, moves all extremities, non  ambulatory Skin: warm and dry, no rashes or wounds on visible skin Neuro:  + generalized weakness,  severe cognitive impairment, anxious affect   Thank you for the opportunity to participate in the care of Tara Huffman.  The palliative care team will continue to follow. Please call our office  at [phone] if we can be of additional assistance.   Jason Coop, NP DNP, AGPCNP-BC  COVID-19 PATIENT SCREENING TOOL Asked and negative response unless otherwise noted:   Have you had symptoms of covid, tested positive or been in contact with someone with symptoms/positive test in the past 5-10 days?

## 2022-09-30 ENCOUNTER — Non-Acute Institutional Stay: Payer: Medicare HMO | Admitting: Nurse Practitioner

## 2022-09-30 ENCOUNTER — Encounter: Payer: Self-pay | Admitting: Nurse Practitioner

## 2022-09-30 DIAGNOSIS — R5381 Other malaise: Secondary | ICD-10-CM

## 2022-09-30 DIAGNOSIS — Z515 Encounter for palliative care: Secondary | ICD-10-CM

## 2022-09-30 DIAGNOSIS — F03C11 Unspecified dementia, severe, with agitation: Secondary | ICD-10-CM

## 2022-09-30 NOTE — Progress Notes (Addendum)
[facility] Consult Note Telephone: [phone]  Fax: [phone]    Date of encounter: 09/30/22 10:26 PM PATIENT NAME: Tara Huffman [address] [place] [address]   [phone] (home)  DOB: 06-13-1941 MRN: [id number] PRIMARY CARE PROVIDER:    [facility]  RESPONSIBLE PARTY:    Contact Information     Name Relation Home Work [place] Daughter [phone]  [phone]   Moore,Carolyn Sister [phone]       I met face to face with [name] in facility. Palliative Care was asked to follow this patient by consultation request of  Rica Koyanagi, MD to address advance care planning and complex medical decision making. This is a follow up visit.                                  ASSESSMENT AND PLAN / RECOMMENDATIONS:  Symptom Management/Plan: 1. Advance Care Planning;  DNR 2. Goals of Care: Goals include to maximize quality of life and symptom management. Our advance care planning conversation included a discussion about:    The value and importance of advance care planning  Exploration of personal, cultural or spiritual beliefs that might influence medical decisions  Exploration of goals of care in the event of a sudden injury or illness  Identification and preparation of a healthcare agent  Review and updating or creation of an advance directive document. 3. Palliative care encounter; Palliative care encounter; Palliative medicine team will continue to support patient, patient's family, and medical team. Visit consisted of counseling and education dealing with the complex and emotionally intense issues of symptom management and palliative care in the setting of serious and potentially life-threatening illness 4. Debility secondary to dementia, progressive, ongoing support, monitor weights, appetite, mood. Continue current poc.  Follow up Palliative Care Visit: Palliative care will continue to follow  for complex medical decision making, advance care planning, and clarification of goals. Return 4 to 8 weeks or prn.  PPS: 40% Chief Complaint: Follow up palliative consult for complex medical decision making, address goals, manage ongoing symptoms   HISTORY OF PRESENT ILLNESS:  Tara Huffman is a 81 y.o. year old female with multiple medical problems including dementia, parkinsonian features h/o hallucinations, afib, h/o myocardial infarction inferior wall, HTN, HLD, insomnia. Ms Huffman resides at [facility] for LTC, requires assistance for transfers, mobility, adl's including bathing, dressing, toileting, feeding with declined appetite per staff. No recent hospitalizations since last PC visit in 06/2022. Staff endorses overall continues to attempt to get up without assistance and requiring to be watched. At present Ms Huffman is at the nurses station in a recliner. Tara Huffman appears comfortable, makes eye contact, word salad. No meaningful discussion with cognitive impairment. Ms Huffman was cooperative with assessment. Support provided. Medical goals, medications, goc reviewed, no changes to poc. Ongoing slow decline in progressive chronic disease of dementia, fall risk. Updated staff.   I spent 36 minutes providing this consultation. More than 50% of the time in this consultation was spent in counseling and care coordination.    History obtained from review of EMR, discussion with primary team, and interview with family, facility staff/caregiver and/or Tara Huffman.  I reviewed available labs, medications, imaging, studies and related documents from the EMR.  Records reviewed and summarized above.    ROS 10 point system reviewed all negative except HPI   Physical Exam: Constitutional:ill  appearing , afebrile General: frail appearing, thin cognitively impaired female EYES: lids intact ENMT: oral mucous membranes moist CV: RRR Pulmonary: Breath sounds clear, decreased Abdomen:  soft, +BS x 4 MSK: non  ambulatory Skin: warm and dry Neuro:  + generalized weakness,  severe cognitive impairment,  Thank you for the opportunity to participate in the care of Tara Huffman. Please call our office at [phone] if we can be of additional assistance.   Lyvia Mondesir Ihor Gully, NP

## 2022-10-18 DIAGNOSIS — F0150 Vascular dementia without behavioral disturbance: Secondary | ICD-10-CM | POA: Diagnosis not present

## 2022-10-18 DIAGNOSIS — R2681 Unsteadiness on feet: Secondary | ICD-10-CM | POA: Diagnosis not present

## 2022-10-18 DIAGNOSIS — M80062D Age-related osteoporosis with current pathological fracture, left lower leg, subsequent encounter for fracture with routine healing: Secondary | ICD-10-CM | POA: Diagnosis not present

## 2022-10-18 DIAGNOSIS — M6259 Muscle wasting and atrophy, not elsewhere classified, multiple sites: Secondary | ICD-10-CM | POA: Diagnosis not present

## 2022-10-18 DIAGNOSIS — F01C18 Vascular dementia, severe, with other behavioral disturbance: Secondary | ICD-10-CM | POA: Diagnosis not present

## 2022-10-18 DIAGNOSIS — I10 Essential (primary) hypertension: Secondary | ICD-10-CM | POA: Diagnosis not present

## 2022-10-18 DIAGNOSIS — M6281 Muscle weakness (generalized): Secondary | ICD-10-CM | POA: Diagnosis not present

## 2022-10-18 DIAGNOSIS — R293 Abnormal posture: Secondary | ICD-10-CM | POA: Diagnosis not present

## 2022-10-18 DIAGNOSIS — I2510 Atherosclerotic heart disease of native coronary artery without angina pectoris: Secondary | ICD-10-CM | POA: Diagnosis not present

## 2022-10-18 DIAGNOSIS — I482 Chronic atrial fibrillation, unspecified: Secondary | ICD-10-CM | POA: Diagnosis not present

## 2022-10-19 DIAGNOSIS — I10 Essential (primary) hypertension: Secondary | ICD-10-CM | POA: Diagnosis not present

## 2022-10-19 DIAGNOSIS — I2510 Atherosclerotic heart disease of native coronary artery without angina pectoris: Secondary | ICD-10-CM | POA: Diagnosis not present

## 2022-10-19 DIAGNOSIS — R2681 Unsteadiness on feet: Secondary | ICD-10-CM | POA: Diagnosis not present

## 2022-10-19 DIAGNOSIS — M6259 Muscle wasting and atrophy, not elsewhere classified, multiple sites: Secondary | ICD-10-CM | POA: Diagnosis not present

## 2022-10-19 DIAGNOSIS — F0150 Vascular dementia without behavioral disturbance: Secondary | ICD-10-CM | POA: Diagnosis not present

## 2022-10-19 DIAGNOSIS — M6281 Muscle weakness (generalized): Secondary | ICD-10-CM | POA: Diagnosis not present

## 2022-10-19 DIAGNOSIS — R293 Abnormal posture: Secondary | ICD-10-CM | POA: Diagnosis not present

## 2022-10-20 DIAGNOSIS — R293 Abnormal posture: Secondary | ICD-10-CM | POA: Diagnosis not present

## 2022-10-20 DIAGNOSIS — F0150 Vascular dementia without behavioral disturbance: Secondary | ICD-10-CM | POA: Diagnosis not present

## 2022-10-20 DIAGNOSIS — I10 Essential (primary) hypertension: Secondary | ICD-10-CM | POA: Diagnosis not present

## 2022-10-20 DIAGNOSIS — M6259 Muscle wasting and atrophy, not elsewhere classified, multiple sites: Secondary | ICD-10-CM | POA: Diagnosis not present

## 2022-10-20 DIAGNOSIS — M6281 Muscle weakness (generalized): Secondary | ICD-10-CM | POA: Diagnosis not present

## 2022-10-20 DIAGNOSIS — I2510 Atherosclerotic heart disease of native coronary artery without angina pectoris: Secondary | ICD-10-CM | POA: Diagnosis not present

## 2022-10-20 DIAGNOSIS — R2681 Unsteadiness on feet: Secondary | ICD-10-CM | POA: Diagnosis not present

## 2022-10-21 DIAGNOSIS — F0150 Vascular dementia without behavioral disturbance: Secondary | ICD-10-CM | POA: Diagnosis not present

## 2022-10-21 DIAGNOSIS — R293 Abnormal posture: Secondary | ICD-10-CM | POA: Diagnosis not present

## 2022-10-21 DIAGNOSIS — M6259 Muscle wasting and atrophy, not elsewhere classified, multiple sites: Secondary | ICD-10-CM | POA: Diagnosis not present

## 2022-10-21 DIAGNOSIS — M6281 Muscle weakness (generalized): Secondary | ICD-10-CM | POA: Diagnosis not present

## 2022-10-21 DIAGNOSIS — I10 Essential (primary) hypertension: Secondary | ICD-10-CM | POA: Diagnosis not present

## 2022-10-21 DIAGNOSIS — R2681 Unsteadiness on feet: Secondary | ICD-10-CM | POA: Diagnosis not present

## 2022-10-21 DIAGNOSIS — I2510 Atherosclerotic heart disease of native coronary artery without angina pectoris: Secondary | ICD-10-CM | POA: Diagnosis not present

## 2022-10-22 DIAGNOSIS — M6281 Muscle weakness (generalized): Secondary | ICD-10-CM | POA: Diagnosis not present

## 2022-10-22 DIAGNOSIS — E785 Hyperlipidemia, unspecified: Secondary | ICD-10-CM | POA: Diagnosis not present

## 2022-10-22 DIAGNOSIS — I2510 Atherosclerotic heart disease of native coronary artery without angina pectoris: Secondary | ICD-10-CM | POA: Diagnosis not present

## 2022-10-22 DIAGNOSIS — F0150 Vascular dementia without behavioral disturbance: Secondary | ICD-10-CM | POA: Diagnosis not present

## 2022-10-22 DIAGNOSIS — R293 Abnormal posture: Secondary | ICD-10-CM | POA: Diagnosis not present

## 2022-10-22 DIAGNOSIS — M6259 Muscle wasting and atrophy, not elsewhere classified, multiple sites: Secondary | ICD-10-CM | POA: Diagnosis not present

## 2022-10-22 DIAGNOSIS — R2681 Unsteadiness on feet: Secondary | ICD-10-CM | POA: Diagnosis not present

## 2022-10-22 DIAGNOSIS — I10 Essential (primary) hypertension: Secondary | ICD-10-CM | POA: Diagnosis not present

## 2022-10-29 DIAGNOSIS — H2513 Age-related nuclear cataract, bilateral: Secondary | ICD-10-CM | POA: Diagnosis not present

## 2022-10-29 DIAGNOSIS — H524 Presbyopia: Secondary | ICD-10-CM | POA: Diagnosis not present

## 2022-11-01 DIAGNOSIS — W19XXXA Unspecified fall, initial encounter: Secondary | ICD-10-CM | POA: Diagnosis not present

## 2022-11-01 DIAGNOSIS — M25512 Pain in left shoulder: Secondary | ICD-10-CM | POA: Diagnosis not present

## 2022-11-01 DIAGNOSIS — M25552 Pain in left hip: Secondary | ICD-10-CM | POA: Diagnosis not present

## 2022-11-02 DIAGNOSIS — M6281 Muscle weakness (generalized): Secondary | ICD-10-CM | POA: Diagnosis not present

## 2022-11-02 DIAGNOSIS — R0981 Nasal congestion: Secondary | ICD-10-CM | POA: Diagnosis not present

## 2022-11-03 DIAGNOSIS — Z20828 Contact with and (suspected) exposure to other viral communicable diseases: Secondary | ICD-10-CM | POA: Diagnosis not present

## 2022-11-03 DIAGNOSIS — F01518 Vascular dementia, unspecified severity, with other behavioral disturbance: Secondary | ICD-10-CM | POA: Diagnosis not present

## 2022-11-03 DIAGNOSIS — G4701 Insomnia due to medical condition: Secondary | ICD-10-CM | POA: Diagnosis not present

## 2022-11-03 DIAGNOSIS — I69815 Cognitive social or emotional deficit following other cerebrovascular disease: Secondary | ICD-10-CM | POA: Diagnosis not present

## 2022-11-08 DIAGNOSIS — L603 Nail dystrophy: Secondary | ICD-10-CM | POA: Diagnosis not present

## 2022-11-08 DIAGNOSIS — I739 Peripheral vascular disease, unspecified: Secondary | ICD-10-CM | POA: Diagnosis not present

## 2022-11-22 ENCOUNTER — Non-Acute Institutional Stay: Payer: Medicare HMO | Admitting: Nurse Practitioner

## 2022-11-22 ENCOUNTER — Encounter: Payer: Self-pay | Admitting: Nurse Practitioner

## 2022-11-22 DIAGNOSIS — F03C11 Unspecified dementia, severe, with agitation: Secondary | ICD-10-CM

## 2022-11-22 DIAGNOSIS — Z515 Encounter for palliative care: Secondary | ICD-10-CM

## 2022-11-22 DIAGNOSIS — R5381 Other malaise: Secondary | ICD-10-CM | POA: Diagnosis not present

## 2022-11-22 NOTE — Progress Notes (Signed)
[facility] Consult Note Telephone: [phone]  Fax: [phone]    Date of encounter: 11/22/22 3:49 PM PATIENT NAME: Tara Huffman [address] [place] [address]   [phone] (home)  DOB: [birth year] MRN: [id number] PRIMARY CARE PROVIDER:    [facility]   RESPONSIBLE PARTY:    Contact Information       Name Relation Home Work [place] Daughter [phone]   [phone]    Moore,Carolyn Sister [phone]           I met face to face with Tara Huffman in facility. Palliative Care was asked to follow this patient by consultation request of  Rica Koyanagi, MD to address advance care planning and complex medical decision making. This is a follow up visit.                                  ASSESSMENT AND PLAN / RECOMMENDATIONS:  Symptom Management/Plan: 1. Advance Care Planning;  DNR 2. Goals of Care: Goals include to maximize quality of life and symptom management. Our advance care planning conversation included a discussion about:    The value and importance of advance care planning  Exploration of personal, cultural or spiritual beliefs that might influence medical decisions  Exploration of goals of care in the event of a sudden injury or illness  Identification and preparation of a healthcare agent  Review and updating or creation of an advance directive document. 3. Palliative care encounter; Palliative care encounter; Palliative medicine team will continue to support patient, patient's family, and medical team. Visit consisted of counseling and education dealing with the complex and emotionally intense issues of symptom management and palliative care in the setting of serious and potentially life-threatening illness 4. Debility secondary to dementia, progressive, ongoing support, monitor weights, appetite, mood. Continue current poc.  Follow up Palliative Care Visit: Palliative care will continue to  follow for complex medical decision making, advance care planning, and clarification of goals. Return 4 to 8 weeks or prn.  PPS: 40% Chief Complaint: Follow up palliative consult for complex medical decision making, address goals, manage ongoing symptoms   HISTORY OF PRESENT ILLNESS:  ELIANY MCCARTER is a 82 y.o. year old female with multiple medical problems including dementia, parkinsonian features h/o hallucinations, afib, h/o myocardial infarction inferior wall, HTN, HLD, insomnia. Tara Huffman resides at [facility] for LTC, requires assistance for transfers, mobility, adl's including bathing, dressing, toileting, feeding with declined appetite per staff. Purpose of today PC f/u visit further discussion monitor trends of appetite, weights, monitor for functional, cognitive decline with chronic disease progression, assess any active symptoms, supportive role. At present Tara Huffman is at the nurses station in chair, propelling herself with there feet. Tara. Huffman appears comfortable, makes eye contact, word salad. No meaningful discussion with cognitive impairment. It was very difficult to get Tara Huffman to redirect, to stay in one place to get her to focus, she continues to try to move the chair during pc visit. Tara Huffman was cooperative with assessment. Support provided. Medical goals, medications, goc reviewed, no changes to poc. Ongoing slow decline in progressive chronic disease of dementia, fall risk. Updated staff. Attempted to contact dtg for update on pc visit. PC f/u visit further discussion monitor trends of appetite, weights, monitor for functional, cognitive decline with chronic disease progression, assess any active symptoms, supportive role.  I spent 35 minutes providing this consultation starting at 2:00 pm. More than 50% of the time in this consultation was spent in counseling and care coordination.    History obtained from review of EMR, discussion with primary team, and  interview with family, facility staff/caregiver and/or Tara. Huffman.  I reviewed available labs, medications, imaging, studies and related documents from the EMR.  Records reviewed and summarized above.   Physical Exam: General: frail appearing, thin cognitively impaired female, pleasant ENMT: oral mucous membranes moist CV: RRR Pulmonary: Breath sounds clear, decreased Skin: warm and dry Neuro:  + generalized weakness,  severe cognitive impairment,  Thank you for the opportunity to participate in the care of Tara. Huffman. Please call our office at [phone] if we can be of additional assistance.   Linton Stolp Ihor Gully, NP

## 2022-11-24 DIAGNOSIS — M6281 Muscle weakness (generalized): Secondary | ICD-10-CM | POA: Diagnosis not present

## 2022-11-24 DIAGNOSIS — E119 Type 2 diabetes mellitus without complications: Secondary | ICD-10-CM | POA: Diagnosis not present

## 2022-11-30 IMAGING — CR DG TIBIA/FIBULA 2V*L*
1 series · 4 of 4 positions shown · non-contrast
Comparison: None Available.

CLINICAL DATA: Trauma, pain and swelling

EXAM:
LEFT TIBIA AND FIBULA - 2 VIEW

[Series 1: dg tibia/fibula left · 0.14mm/px · 4 of 4 slices shown]
[im 1/4]
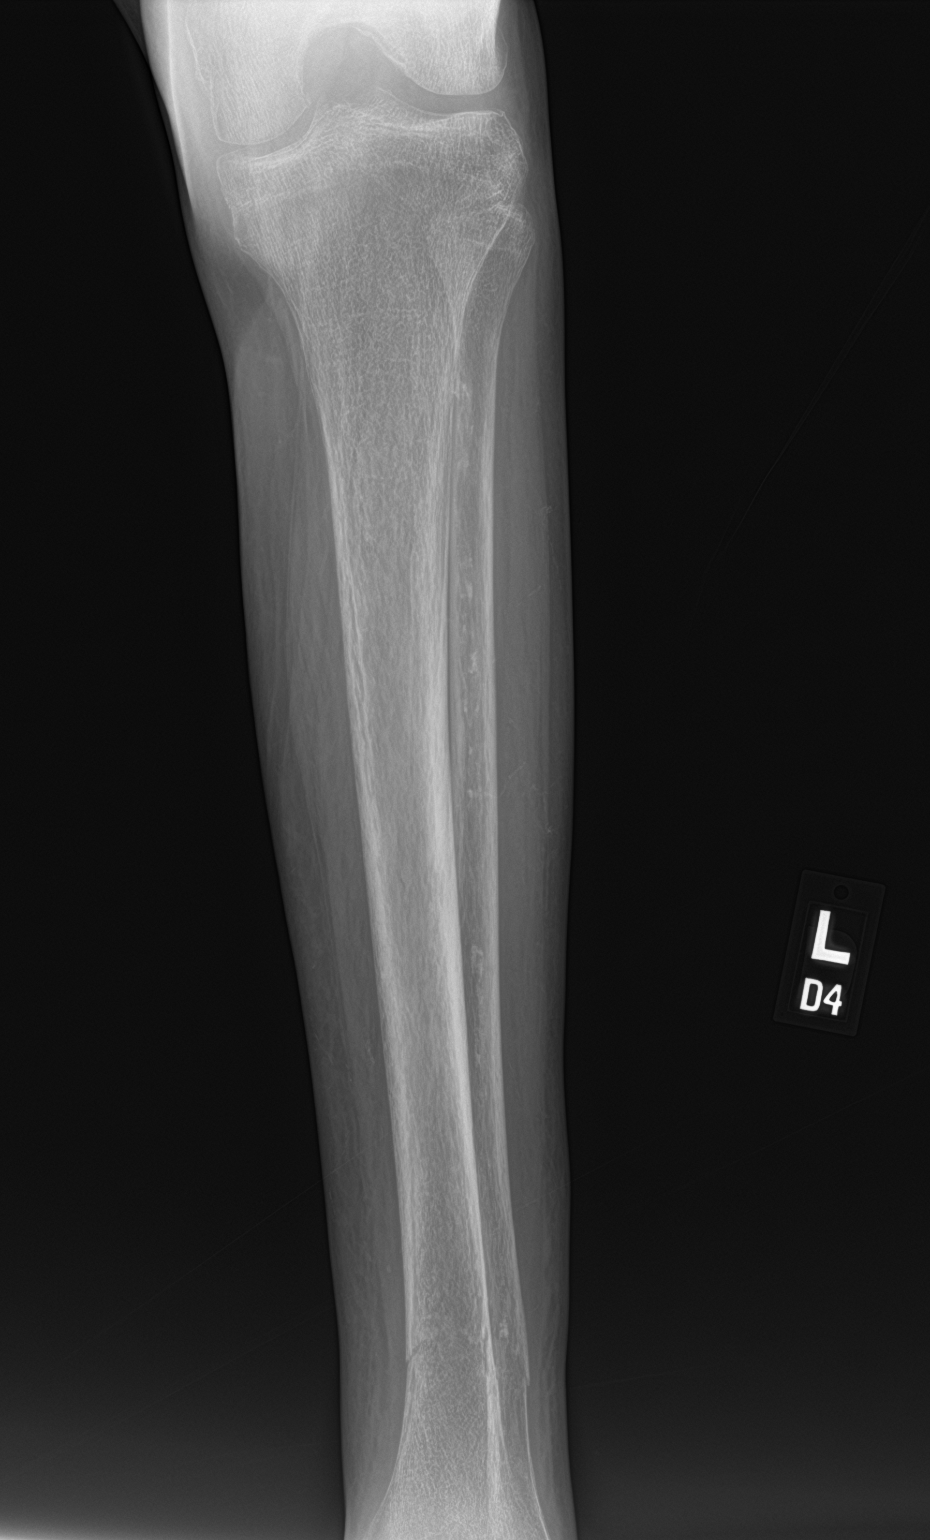
[im 2/4]
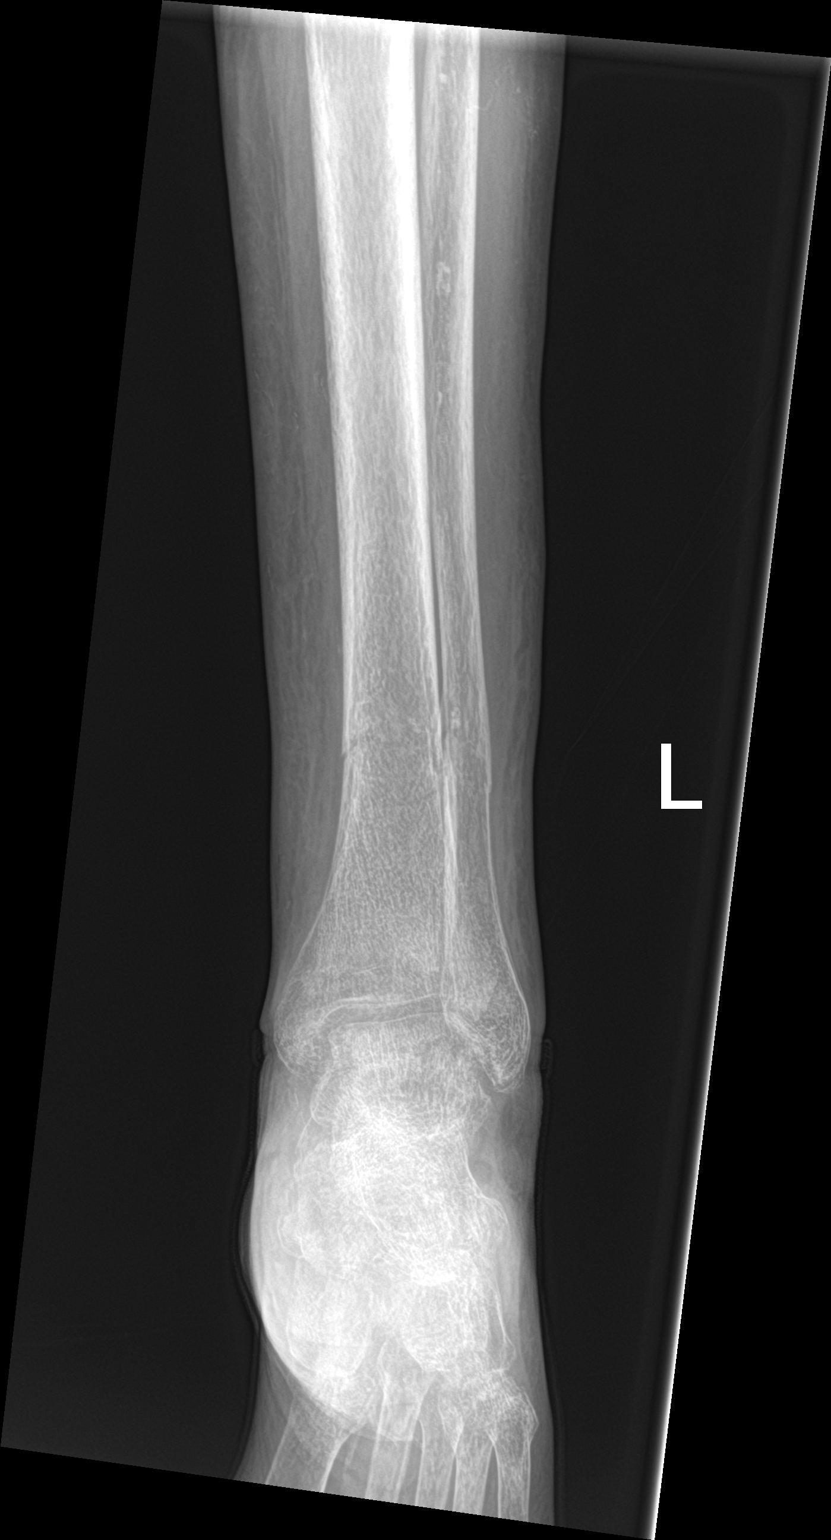
[im 3/4]
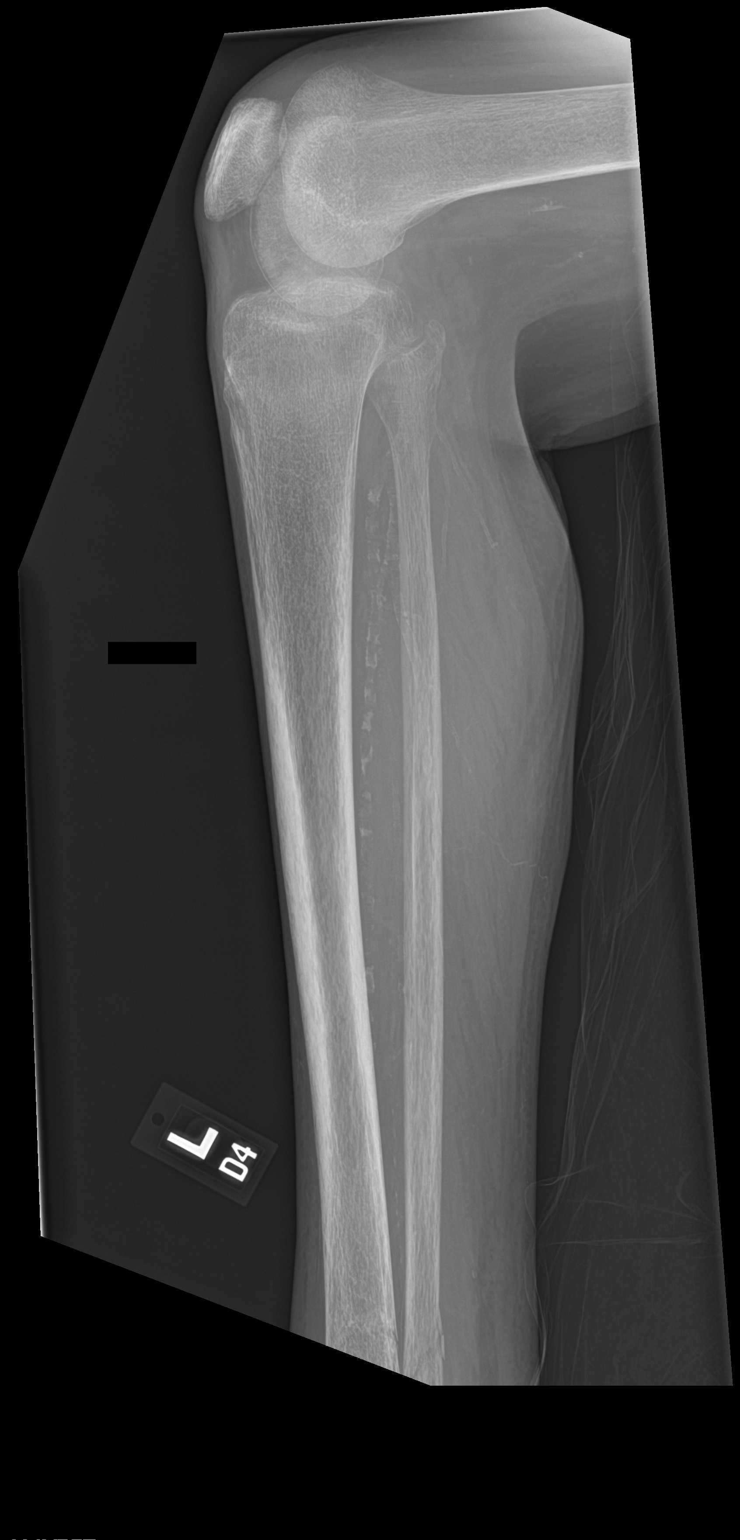
[im 4/4]
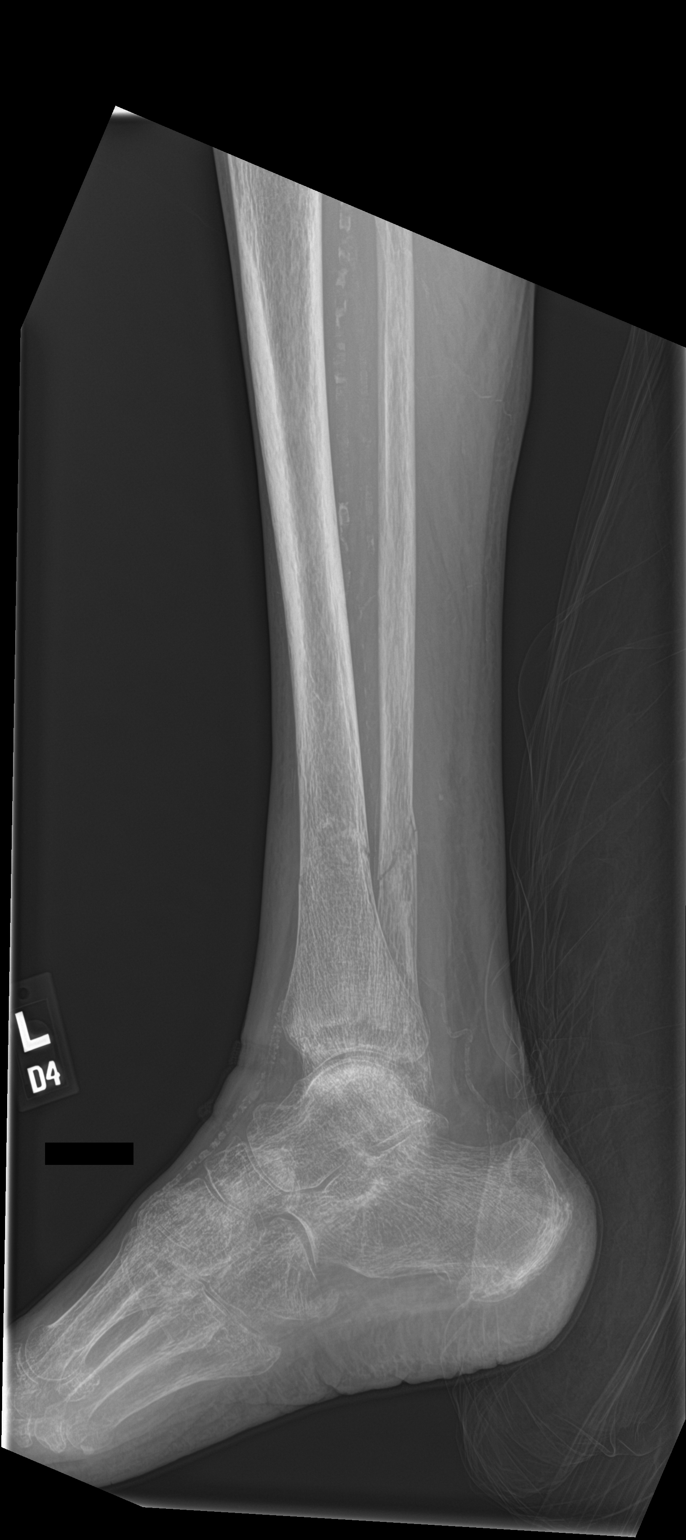

[4 of 4 positions shown; findings below may reference images not displayed]

FINDINGS: Undisplaced fractures are seen in the distal shafts of left tibia
and fibula. Osteopenia is seen in bony structures. Vascular
calcifications are seen in the soft tissues.
IMPRESSION: Undisplaced recent fractures are seen in the distal shafts of left
tibia and fibula.

## 2022-12-01 DIAGNOSIS — F01518 Vascular dementia, unspecified severity, with other behavioral disturbance: Secondary | ICD-10-CM | POA: Diagnosis not present

## 2022-12-01 DIAGNOSIS — G4701 Insomnia due to medical condition: Secondary | ICD-10-CM | POA: Diagnosis not present

## 2022-12-01 DIAGNOSIS — I69815 Cognitive social or emotional deficit following other cerebrovascular disease: Secondary | ICD-10-CM | POA: Diagnosis not present

## 2022-12-07 DIAGNOSIS — F01C18 Vascular dementia, severe, with other behavioral disturbance: Secondary | ICD-10-CM | POA: Diagnosis not present

## 2022-12-07 DIAGNOSIS — M6281 Muscle weakness (generalized): Secondary | ICD-10-CM | POA: Diagnosis not present

## 2022-12-07 DIAGNOSIS — M80062D Age-related osteoporosis with current pathological fracture, left lower leg, subsequent encounter for fracture with routine healing: Secondary | ICD-10-CM | POA: Diagnosis not present

## 2022-12-07 DIAGNOSIS — R251 Tremor, unspecified: Secondary | ICD-10-CM | POA: Diagnosis not present

## 2022-12-22 ENCOUNTER — Encounter: Payer: Self-pay | Admitting: Nurse Practitioner

## 2022-12-22 ENCOUNTER — Non-Acute Institutional Stay: Payer: Medicare HMO | Admitting: Nurse Practitioner

## 2022-12-22 DIAGNOSIS — Z515 Encounter for palliative care: Secondary | ICD-10-CM

## 2022-12-22 DIAGNOSIS — R634 Abnormal weight loss: Secondary | ICD-10-CM

## 2022-12-22 DIAGNOSIS — R630 Anorexia: Secondary | ICD-10-CM | POA: Diagnosis not present

## 2022-12-22 DIAGNOSIS — F03C11 Unspecified dementia, severe, with agitation: Secondary | ICD-10-CM | POA: Diagnosis not present

## 2022-12-22 NOTE — Progress Notes (Signed)
Designer, [facility]ell[facility] Palliative Care Consult Note Telephone: [phone]  Fax: [phone]    Date of encounter: 12/22/22 7:33 PM PATIENT NAME: Tara Huffman [address] [place] [address]   [phone] (home)  DOB: 1941/01/27 MRN: [id number] PRIMARY CARE PROVIDER:    [facility]  RESPONSIBLE PARTY:    Contact Information     Name Relation Home Work Tara Huffman Daughter [phone]  [phone]   Tara Huffman,Tara Huffman Sister [phone]       I met face to face with Tara Huffman in facility. Palliative Care was asked to follow this patient by consultation request of  Rica Koyanagi, MD to address advance care planning and complex medical decision making. This is a follow up visit.                                  ASSESSMENT AND PLAN / RECOMMENDATIONS:  Symptom Management/Plan: 1. Advance Care Planning;  DNR 2. Palliative care encounter; Palliative care encounter; Palliative medicine team will continue to support patient, patient's family, and medical team. Visit consisted of counseling and education dealing with the complex and emotionally intense issues of symptom management and palliative care in the setting of serious and potentially life-threatening illness 3. Dementia/weight loss; anorexia;  progressive, reviewed weights; ongoing support, monitor weights, appetite, mood, encourage Tara Huffman to eat, supplements, when >10% may with further decline, will discuss with dtg option of Medicare benefit of hospice program see if there is an interest with revisit goc. Continue current poc.   07/16/2022 weight 100 lbs 12/17/2022 weight 91.9 lbs 8.1 lbs /8.10 % loss/6 months;   Follow up Palliative Care Visit: PC f/u visit further discussion monitor trends of appetite, weights, monitor for functional, cognitive decline with chronic disease progression, assess any active symptoms, supportive role. Palliative care will continue to follow for complex  medical decision making, advance care planning, and clarification of goals. Return 4 to 8 weeks or prn.  PPS: 40% Chief Complaint: Follow up palliative consult for complex medical decision making, address goals, manage ongoing symptoms   HISTORY OF PRESENT ILLNESS:  Tara Huffman is a 82 y.o. year old female with multiple medical problems including dementia, parkinsonian features h/o hallucinations, afib, h/o myocardial infarction inferior wall, HTN, HLD, insomnia. Tara Huffman resides at [facility] for LTC, requires assistance for transfers, mobility, adl's including bathing, dressing, toileting, feeding with declined appetite per staff. Purpose of today PC f/u visit further discussion monitor trends of appetite, weights, monitor for functional, cognitive decline with chronic disease progression, assess any active symptoms, supportive role. At present Tara Huffman was sitting in the geri-chair in her room. Tara Huffman appears comfortable, makes eye contact though no verbal discussion due to cognitive impairment.  Tara Huffman was cooperative with assessment. Support provided. Medical goals, medications, goc reviewed, no changes to poc. Ongoing slow decline in progressive chronic disease of dementia, fall risk. Updated staff. Attempted to contact dtg for update on pc visit. PC f/u visit further discussion monitor trends of appetite, weights, monitor for functional, cognitive decline with chronic disease progression, assess any active symptoms, supportive role.   I spent 45 minutes providing this consultation. More than 50% of the time in this consultation was spent in counseling and care coordination.    History obtained from review of EMR, discussion with primary team, and interview with family, facility staff/caregiver and/or Tara. Huffman.  I reviewed available  labs, medications, imaging, studies and related documents from the EMR.  Records reviewed and summarized above.    Physical Exam: General:  frail appearing, thin cognitively impaired female, pleasant ENMT: oral mucous membranes moist CV: RRR Pulmonary: Breath sounds clear Skin: warm and dry Neuro:  + generalized weakness,  severe cognitive impairment,  Thank you for the opportunity to participate in the care of Tara Huffman. Please call our office at [phone] if we can be of additional assistance.   Zaharah Amir Ihor Gully, NP

## 2022-12-28 DIAGNOSIS — R293 Abnormal posture: Secondary | ICD-10-CM | POA: Diagnosis not present

## 2022-12-28 DIAGNOSIS — F0150 Vascular dementia without behavioral disturbance: Secondary | ICD-10-CM | POA: Diagnosis not present

## 2022-12-28 DIAGNOSIS — R0981 Nasal congestion: Secondary | ICD-10-CM | POA: Diagnosis not present

## 2022-12-28 DIAGNOSIS — F419 Anxiety disorder, unspecified: Secondary | ICD-10-CM | POA: Diagnosis not present

## 2022-12-28 DIAGNOSIS — I10 Essential (primary) hypertension: Secondary | ICD-10-CM | POA: Diagnosis not present

## 2022-12-28 DIAGNOSIS — I2510 Atherosclerotic heart disease of native coronary artery without angina pectoris: Secondary | ICD-10-CM | POA: Diagnosis not present

## 2022-12-28 DIAGNOSIS — F0390 Unspecified dementia without behavioral disturbance: Secondary | ICD-10-CM | POA: Diagnosis not present

## 2022-12-28 DIAGNOSIS — R2681 Unsteadiness on feet: Secondary | ICD-10-CM | POA: Diagnosis not present

## 2022-12-28 DIAGNOSIS — R278 Other lack of coordination: Secondary | ICD-10-CM | POA: Diagnosis not present

## 2022-12-28 DIAGNOSIS — M6259 Muscle wasting and atrophy, not elsewhere classified, multiple sites: Secondary | ICD-10-CM | POA: Diagnosis not present

## 2022-12-28 DIAGNOSIS — I4891 Unspecified atrial fibrillation: Secondary | ICD-10-CM | POA: Diagnosis not present

## 2022-12-29 DIAGNOSIS — R293 Abnormal posture: Secondary | ICD-10-CM | POA: Diagnosis not present

## 2022-12-29 DIAGNOSIS — R0981 Nasal congestion: Secondary | ICD-10-CM | POA: Diagnosis not present

## 2022-12-29 DIAGNOSIS — G4701 Insomnia due to medical condition: Secondary | ICD-10-CM | POA: Diagnosis not present

## 2022-12-29 DIAGNOSIS — F01518 Vascular dementia, unspecified severity, with other behavioral disturbance: Secondary | ICD-10-CM | POA: Diagnosis not present

## 2022-12-29 DIAGNOSIS — R2681 Unsteadiness on feet: Secondary | ICD-10-CM | POA: Diagnosis not present

## 2022-12-29 DIAGNOSIS — I69815 Cognitive social or emotional deficit following other cerebrovascular disease: Secondary | ICD-10-CM | POA: Diagnosis not present

## 2022-12-29 DIAGNOSIS — I2510 Atherosclerotic heart disease of native coronary artery without angina pectoris: Secondary | ICD-10-CM | POA: Diagnosis not present

## 2022-12-29 DIAGNOSIS — I10 Essential (primary) hypertension: Secondary | ICD-10-CM | POA: Diagnosis not present

## 2022-12-29 DIAGNOSIS — R278 Other lack of coordination: Secondary | ICD-10-CM | POA: Diagnosis not present

## 2022-12-29 DIAGNOSIS — I4891 Unspecified atrial fibrillation: Secondary | ICD-10-CM | POA: Diagnosis not present

## 2022-12-29 DIAGNOSIS — M6259 Muscle wasting and atrophy, not elsewhere classified, multiple sites: Secondary | ICD-10-CM | POA: Diagnosis not present

## 2022-12-29 DIAGNOSIS — F0150 Vascular dementia without behavioral disturbance: Secondary | ICD-10-CM | POA: Diagnosis not present

## 2022-12-30 DIAGNOSIS — I2510 Atherosclerotic heart disease of native coronary artery without angina pectoris: Secondary | ICD-10-CM | POA: Diagnosis not present

## 2022-12-30 DIAGNOSIS — R293 Abnormal posture: Secondary | ICD-10-CM | POA: Diagnosis not present

## 2022-12-30 DIAGNOSIS — I10 Essential (primary) hypertension: Secondary | ICD-10-CM | POA: Diagnosis not present

## 2022-12-30 DIAGNOSIS — M6259 Muscle wasting and atrophy, not elsewhere classified, multiple sites: Secondary | ICD-10-CM | POA: Diagnosis not present

## 2022-12-30 DIAGNOSIS — F0150 Vascular dementia without behavioral disturbance: Secondary | ICD-10-CM | POA: Diagnosis not present

## 2022-12-30 DIAGNOSIS — R2681 Unsteadiness on feet: Secondary | ICD-10-CM | POA: Diagnosis not present

## 2022-12-30 DIAGNOSIS — R278 Other lack of coordination: Secondary | ICD-10-CM | POA: Diagnosis not present

## 2022-12-30 DIAGNOSIS — I4891 Unspecified atrial fibrillation: Secondary | ICD-10-CM | POA: Diagnosis not present

## 2022-12-30 DIAGNOSIS — R0981 Nasal congestion: Secondary | ICD-10-CM | POA: Diagnosis not present

## 2022-12-31 DIAGNOSIS — F0150 Vascular dementia without behavioral disturbance: Secondary | ICD-10-CM | POA: Diagnosis not present

## 2022-12-31 DIAGNOSIS — M6259 Muscle wasting and atrophy, not elsewhere classified, multiple sites: Secondary | ICD-10-CM | POA: Diagnosis not present

## 2022-12-31 DIAGNOSIS — R2681 Unsteadiness on feet: Secondary | ICD-10-CM | POA: Diagnosis not present

## 2022-12-31 DIAGNOSIS — I4891 Unspecified atrial fibrillation: Secondary | ICD-10-CM | POA: Diagnosis not present

## 2022-12-31 DIAGNOSIS — R0981 Nasal congestion: Secondary | ICD-10-CM | POA: Diagnosis not present

## 2022-12-31 DIAGNOSIS — R278 Other lack of coordination: Secondary | ICD-10-CM | POA: Diagnosis not present

## 2022-12-31 DIAGNOSIS — I2510 Atherosclerotic heart disease of native coronary artery without angina pectoris: Secondary | ICD-10-CM | POA: Diagnosis not present

## 2022-12-31 DIAGNOSIS — I10 Essential (primary) hypertension: Secondary | ICD-10-CM | POA: Diagnosis not present

## 2022-12-31 DIAGNOSIS — R293 Abnormal posture: Secondary | ICD-10-CM | POA: Diagnosis not present

## 2023-01-03 DIAGNOSIS — M6259 Muscle wasting and atrophy, not elsewhere classified, multiple sites: Secondary | ICD-10-CM | POA: Diagnosis not present

## 2023-01-03 DIAGNOSIS — I4891 Unspecified atrial fibrillation: Secondary | ICD-10-CM | POA: Diagnosis not present

## 2023-01-03 DIAGNOSIS — R278 Other lack of coordination: Secondary | ICD-10-CM | POA: Diagnosis not present

## 2023-01-03 DIAGNOSIS — I10 Essential (primary) hypertension: Secondary | ICD-10-CM | POA: Diagnosis not present

## 2023-01-03 DIAGNOSIS — R0981 Nasal congestion: Secondary | ICD-10-CM | POA: Diagnosis not present

## 2023-01-03 DIAGNOSIS — I2510 Atherosclerotic heart disease of native coronary artery without angina pectoris: Secondary | ICD-10-CM | POA: Diagnosis not present

## 2023-01-03 DIAGNOSIS — F0150 Vascular dementia without behavioral disturbance: Secondary | ICD-10-CM | POA: Diagnosis not present

## 2023-01-03 DIAGNOSIS — R293 Abnormal posture: Secondary | ICD-10-CM | POA: Diagnosis not present

## 2023-01-03 DIAGNOSIS — R2681 Unsteadiness on feet: Secondary | ICD-10-CM | POA: Diagnosis not present

## 2023-01-04 DIAGNOSIS — I10 Essential (primary) hypertension: Secondary | ICD-10-CM | POA: Diagnosis not present

## 2023-01-04 DIAGNOSIS — I4891 Unspecified atrial fibrillation: Secondary | ICD-10-CM | POA: Diagnosis not present

## 2023-01-04 DIAGNOSIS — R0981 Nasal congestion: Secondary | ICD-10-CM | POA: Diagnosis not present

## 2023-01-04 DIAGNOSIS — R293 Abnormal posture: Secondary | ICD-10-CM | POA: Diagnosis not present

## 2023-01-04 DIAGNOSIS — R2681 Unsteadiness on feet: Secondary | ICD-10-CM | POA: Diagnosis not present

## 2023-01-04 DIAGNOSIS — F0150 Vascular dementia without behavioral disturbance: Secondary | ICD-10-CM | POA: Diagnosis not present

## 2023-01-04 DIAGNOSIS — I2510 Atherosclerotic heart disease of native coronary artery without angina pectoris: Secondary | ICD-10-CM | POA: Diagnosis not present

## 2023-01-04 DIAGNOSIS — R278 Other lack of coordination: Secondary | ICD-10-CM | POA: Diagnosis not present

## 2023-01-04 DIAGNOSIS — M6259 Muscle wasting and atrophy, not elsewhere classified, multiple sites: Secondary | ICD-10-CM | POA: Diagnosis not present

## 2023-01-05 DIAGNOSIS — I10 Essential (primary) hypertension: Secondary | ICD-10-CM | POA: Diagnosis not present

## 2023-01-05 DIAGNOSIS — I4891 Unspecified atrial fibrillation: Secondary | ICD-10-CM | POA: Diagnosis not present

## 2023-01-05 DIAGNOSIS — I2510 Atherosclerotic heart disease of native coronary artery without angina pectoris: Secondary | ICD-10-CM | POA: Diagnosis not present

## 2023-01-05 DIAGNOSIS — R2681 Unsteadiness on feet: Secondary | ICD-10-CM | POA: Diagnosis not present

## 2023-01-05 DIAGNOSIS — M6259 Muscle wasting and atrophy, not elsewhere classified, multiple sites: Secondary | ICD-10-CM | POA: Diagnosis not present

## 2023-01-05 DIAGNOSIS — R293 Abnormal posture: Secondary | ICD-10-CM | POA: Diagnosis not present

## 2023-01-05 DIAGNOSIS — R278 Other lack of coordination: Secondary | ICD-10-CM | POA: Diagnosis not present

## 2023-01-05 DIAGNOSIS — F0150 Vascular dementia without behavioral disturbance: Secondary | ICD-10-CM | POA: Diagnosis not present

## 2023-01-05 DIAGNOSIS — R0981 Nasal congestion: Secondary | ICD-10-CM | POA: Diagnosis not present

## 2023-01-06 DIAGNOSIS — M6259 Muscle wasting and atrophy, not elsewhere classified, multiple sites: Secondary | ICD-10-CM | POA: Diagnosis not present

## 2023-01-06 DIAGNOSIS — F0150 Vascular dementia without behavioral disturbance: Secondary | ICD-10-CM | POA: Diagnosis not present

## 2023-01-06 DIAGNOSIS — R2681 Unsteadiness on feet: Secondary | ICD-10-CM | POA: Diagnosis not present

## 2023-01-06 DIAGNOSIS — R293 Abnormal posture: Secondary | ICD-10-CM | POA: Diagnosis not present

## 2023-01-06 DIAGNOSIS — I2510 Atherosclerotic heart disease of native coronary artery without angina pectoris: Secondary | ICD-10-CM | POA: Diagnosis not present

## 2023-01-06 DIAGNOSIS — I4891 Unspecified atrial fibrillation: Secondary | ICD-10-CM | POA: Diagnosis not present

## 2023-01-06 DIAGNOSIS — R278 Other lack of coordination: Secondary | ICD-10-CM | POA: Diagnosis not present

## 2023-01-06 DIAGNOSIS — R0981 Nasal congestion: Secondary | ICD-10-CM | POA: Diagnosis not present

## 2023-01-06 DIAGNOSIS — I10 Essential (primary) hypertension: Secondary | ICD-10-CM | POA: Diagnosis not present

## 2023-01-07 DIAGNOSIS — I4891 Unspecified atrial fibrillation: Secondary | ICD-10-CM | POA: Diagnosis not present

## 2023-01-07 DIAGNOSIS — I2510 Atherosclerotic heart disease of native coronary artery without angina pectoris: Secondary | ICD-10-CM | POA: Diagnosis not present

## 2023-01-07 DIAGNOSIS — F0150 Vascular dementia without behavioral disturbance: Secondary | ICD-10-CM | POA: Diagnosis not present

## 2023-01-07 DIAGNOSIS — R2681 Unsteadiness on feet: Secondary | ICD-10-CM | POA: Diagnosis not present

## 2023-01-07 DIAGNOSIS — R293 Abnormal posture: Secondary | ICD-10-CM | POA: Diagnosis not present

## 2023-01-07 DIAGNOSIS — R0981 Nasal congestion: Secondary | ICD-10-CM | POA: Diagnosis not present

## 2023-01-07 DIAGNOSIS — M6259 Muscle wasting and atrophy, not elsewhere classified, multiple sites: Secondary | ICD-10-CM | POA: Diagnosis not present

## 2023-01-07 DIAGNOSIS — I10 Essential (primary) hypertension: Secondary | ICD-10-CM | POA: Diagnosis not present

## 2023-01-07 DIAGNOSIS — R278 Other lack of coordination: Secondary | ICD-10-CM | POA: Diagnosis not present

## 2023-01-10 DIAGNOSIS — I10 Essential (primary) hypertension: Secondary | ICD-10-CM | POA: Diagnosis not present

## 2023-01-10 DIAGNOSIS — I2510 Atherosclerotic heart disease of native coronary artery without angina pectoris: Secondary | ICD-10-CM | POA: Diagnosis not present

## 2023-01-10 DIAGNOSIS — F0150 Vascular dementia without behavioral disturbance: Secondary | ICD-10-CM | POA: Diagnosis not present

## 2023-01-10 DIAGNOSIS — R2681 Unsteadiness on feet: Secondary | ICD-10-CM | POA: Diagnosis not present

## 2023-01-10 DIAGNOSIS — R0981 Nasal congestion: Secondary | ICD-10-CM | POA: Diagnosis not present

## 2023-01-10 DIAGNOSIS — I4891 Unspecified atrial fibrillation: Secondary | ICD-10-CM | POA: Diagnosis not present

## 2023-01-10 DIAGNOSIS — M6259 Muscle wasting and atrophy, not elsewhere classified, multiple sites: Secondary | ICD-10-CM | POA: Diagnosis not present

## 2023-01-10 DIAGNOSIS — R278 Other lack of coordination: Secondary | ICD-10-CM | POA: Diagnosis not present

## 2023-01-10 DIAGNOSIS — R293 Abnormal posture: Secondary | ICD-10-CM | POA: Diagnosis not present

## 2023-01-11 DIAGNOSIS — F0150 Vascular dementia without behavioral disturbance: Secondary | ICD-10-CM | POA: Diagnosis not present

## 2023-01-11 DIAGNOSIS — R278 Other lack of coordination: Secondary | ICD-10-CM | POA: Diagnosis not present

## 2023-01-11 DIAGNOSIS — I2510 Atherosclerotic heart disease of native coronary artery without angina pectoris: Secondary | ICD-10-CM | POA: Diagnosis not present

## 2023-01-11 DIAGNOSIS — R0981 Nasal congestion: Secondary | ICD-10-CM | POA: Diagnosis not present

## 2023-01-11 DIAGNOSIS — M6259 Muscle wasting and atrophy, not elsewhere classified, multiple sites: Secondary | ICD-10-CM | POA: Diagnosis not present

## 2023-01-11 DIAGNOSIS — I10 Essential (primary) hypertension: Secondary | ICD-10-CM | POA: Diagnosis not present

## 2023-01-11 DIAGNOSIS — R2681 Unsteadiness on feet: Secondary | ICD-10-CM | POA: Diagnosis not present

## 2023-01-11 DIAGNOSIS — R293 Abnormal posture: Secondary | ICD-10-CM | POA: Diagnosis not present

## 2023-01-11 DIAGNOSIS — I4891 Unspecified atrial fibrillation: Secondary | ICD-10-CM | POA: Diagnosis not present

## 2023-01-12 DIAGNOSIS — M6259 Muscle wasting and atrophy, not elsewhere classified, multiple sites: Secondary | ICD-10-CM | POA: Diagnosis not present

## 2023-01-12 DIAGNOSIS — R293 Abnormal posture: Secondary | ICD-10-CM | POA: Diagnosis not present

## 2023-01-12 DIAGNOSIS — I4891 Unspecified atrial fibrillation: Secondary | ICD-10-CM | POA: Diagnosis not present

## 2023-01-12 DIAGNOSIS — F0150 Vascular dementia without behavioral disturbance: Secondary | ICD-10-CM | POA: Diagnosis not present

## 2023-01-12 DIAGNOSIS — R278 Other lack of coordination: Secondary | ICD-10-CM | POA: Diagnosis not present

## 2023-01-12 DIAGNOSIS — R0981 Nasal congestion: Secondary | ICD-10-CM | POA: Diagnosis not present

## 2023-01-12 DIAGNOSIS — I10 Essential (primary) hypertension: Secondary | ICD-10-CM | POA: Diagnosis not present

## 2023-01-12 DIAGNOSIS — R2681 Unsteadiness on feet: Secondary | ICD-10-CM | POA: Diagnosis not present

## 2023-01-12 DIAGNOSIS — I2510 Atherosclerotic heart disease of native coronary artery without angina pectoris: Secondary | ICD-10-CM | POA: Diagnosis not present

## 2023-01-13 DIAGNOSIS — R278 Other lack of coordination: Secondary | ICD-10-CM | POA: Diagnosis not present

## 2023-01-13 DIAGNOSIS — F0150 Vascular dementia without behavioral disturbance: Secondary | ICD-10-CM | POA: Diagnosis not present

## 2023-01-13 DIAGNOSIS — R0981 Nasal congestion: Secondary | ICD-10-CM | POA: Diagnosis not present

## 2023-01-13 DIAGNOSIS — R2681 Unsteadiness on feet: Secondary | ICD-10-CM | POA: Diagnosis not present

## 2023-01-13 DIAGNOSIS — I10 Essential (primary) hypertension: Secondary | ICD-10-CM | POA: Diagnosis not present

## 2023-01-13 DIAGNOSIS — R293 Abnormal posture: Secondary | ICD-10-CM | POA: Diagnosis not present

## 2023-01-13 DIAGNOSIS — M6259 Muscle wasting and atrophy, not elsewhere classified, multiple sites: Secondary | ICD-10-CM | POA: Diagnosis not present

## 2023-01-13 DIAGNOSIS — I4891 Unspecified atrial fibrillation: Secondary | ICD-10-CM | POA: Diagnosis not present

## 2023-01-13 DIAGNOSIS — I2510 Atherosclerotic heart disease of native coronary artery without angina pectoris: Secondary | ICD-10-CM | POA: Diagnosis not present

## 2023-01-13 DIAGNOSIS — J309 Allergic rhinitis, unspecified: Secondary | ICD-10-CM | POA: Diagnosis not present

## 2023-01-14 DIAGNOSIS — I2510 Atherosclerotic heart disease of native coronary artery without angina pectoris: Secondary | ICD-10-CM | POA: Diagnosis not present

## 2023-01-14 DIAGNOSIS — R293 Abnormal posture: Secondary | ICD-10-CM | POA: Diagnosis not present

## 2023-01-14 DIAGNOSIS — M6259 Muscle wasting and atrophy, not elsewhere classified, multiple sites: Secondary | ICD-10-CM | POA: Diagnosis not present

## 2023-01-14 DIAGNOSIS — R0981 Nasal congestion: Secondary | ICD-10-CM | POA: Diagnosis not present

## 2023-01-14 DIAGNOSIS — R278 Other lack of coordination: Secondary | ICD-10-CM | POA: Diagnosis not present

## 2023-01-14 DIAGNOSIS — I4891 Unspecified atrial fibrillation: Secondary | ICD-10-CM | POA: Diagnosis not present

## 2023-01-14 DIAGNOSIS — F0150 Vascular dementia without behavioral disturbance: Secondary | ICD-10-CM | POA: Diagnosis not present

## 2023-01-14 DIAGNOSIS — I10 Essential (primary) hypertension: Secondary | ICD-10-CM | POA: Diagnosis not present

## 2023-01-14 DIAGNOSIS — R2681 Unsteadiness on feet: Secondary | ICD-10-CM | POA: Diagnosis not present

## 2023-01-18 DIAGNOSIS — R0981 Nasal congestion: Secondary | ICD-10-CM | POA: Diagnosis not present

## 2023-01-18 DIAGNOSIS — I2510 Atherosclerotic heart disease of native coronary artery without angina pectoris: Secondary | ICD-10-CM | POA: Diagnosis not present

## 2023-01-18 DIAGNOSIS — R293 Abnormal posture: Secondary | ICD-10-CM | POA: Diagnosis not present

## 2023-01-18 DIAGNOSIS — I4891 Unspecified atrial fibrillation: Secondary | ICD-10-CM | POA: Diagnosis not present

## 2023-01-18 DIAGNOSIS — M6259 Muscle wasting and atrophy, not elsewhere classified, multiple sites: Secondary | ICD-10-CM | POA: Diagnosis not present

## 2023-01-18 DIAGNOSIS — I10 Essential (primary) hypertension: Secondary | ICD-10-CM | POA: Diagnosis not present

## 2023-01-18 DIAGNOSIS — R627 Adult failure to thrive: Secondary | ICD-10-CM | POA: Diagnosis not present

## 2023-01-18 DIAGNOSIS — R278 Other lack of coordination: Secondary | ICD-10-CM | POA: Diagnosis not present

## 2023-01-18 DIAGNOSIS — F0150 Vascular dementia without behavioral disturbance: Secondary | ICD-10-CM | POA: Diagnosis not present

## 2023-01-19 DIAGNOSIS — R627 Adult failure to thrive: Secondary | ICD-10-CM | POA: Diagnosis not present

## 2023-01-19 DIAGNOSIS — R278 Other lack of coordination: Secondary | ICD-10-CM | POA: Diagnosis not present

## 2023-01-19 DIAGNOSIS — J309 Allergic rhinitis, unspecified: Secondary | ICD-10-CM | POA: Diagnosis not present

## 2023-01-19 DIAGNOSIS — R293 Abnormal posture: Secondary | ICD-10-CM | POA: Diagnosis not present

## 2023-01-19 DIAGNOSIS — M6259 Muscle wasting and atrophy, not elsewhere classified, multiple sites: Secondary | ICD-10-CM | POA: Diagnosis not present

## 2023-01-19 DIAGNOSIS — R0981 Nasal congestion: Secondary | ICD-10-CM | POA: Diagnosis not present

## 2023-01-19 DIAGNOSIS — I4891 Unspecified atrial fibrillation: Secondary | ICD-10-CM | POA: Diagnosis not present

## 2023-01-19 DIAGNOSIS — I10 Essential (primary) hypertension: Secondary | ICD-10-CM | POA: Diagnosis not present

## 2023-01-19 DIAGNOSIS — M6281 Muscle weakness (generalized): Secondary | ICD-10-CM | POA: Diagnosis not present

## 2023-01-19 DIAGNOSIS — I2510 Atherosclerotic heart disease of native coronary artery without angina pectoris: Secondary | ICD-10-CM | POA: Diagnosis not present

## 2023-01-19 DIAGNOSIS — F0150 Vascular dementia without behavioral disturbance: Secondary | ICD-10-CM | POA: Diagnosis not present

## 2023-01-20 DIAGNOSIS — I10 Essential (primary) hypertension: Secondary | ICD-10-CM | POA: Diagnosis not present

## 2023-01-20 DIAGNOSIS — R278 Other lack of coordination: Secondary | ICD-10-CM | POA: Diagnosis not present

## 2023-01-20 DIAGNOSIS — R627 Adult failure to thrive: Secondary | ICD-10-CM | POA: Diagnosis not present

## 2023-01-20 DIAGNOSIS — I4891 Unspecified atrial fibrillation: Secondary | ICD-10-CM | POA: Diagnosis not present

## 2023-01-20 DIAGNOSIS — I2510 Atherosclerotic heart disease of native coronary artery without angina pectoris: Secondary | ICD-10-CM | POA: Diagnosis not present

## 2023-01-20 DIAGNOSIS — F0150 Vascular dementia without behavioral disturbance: Secondary | ICD-10-CM | POA: Diagnosis not present

## 2023-01-20 DIAGNOSIS — R0981 Nasal congestion: Secondary | ICD-10-CM | POA: Diagnosis not present

## 2023-01-20 DIAGNOSIS — M6259 Muscle wasting and atrophy, not elsewhere classified, multiple sites: Secondary | ICD-10-CM | POA: Diagnosis not present

## 2023-01-20 DIAGNOSIS — R293 Abnormal posture: Secondary | ICD-10-CM | POA: Diagnosis not present

## 2023-01-21 DIAGNOSIS — R0981 Nasal congestion: Secondary | ICD-10-CM | POA: Diagnosis not present

## 2023-01-21 DIAGNOSIS — R627 Adult failure to thrive: Secondary | ICD-10-CM | POA: Diagnosis not present

## 2023-01-21 DIAGNOSIS — I4891 Unspecified atrial fibrillation: Secondary | ICD-10-CM | POA: Diagnosis not present

## 2023-01-21 DIAGNOSIS — I2510 Atherosclerotic heart disease of native coronary artery without angina pectoris: Secondary | ICD-10-CM | POA: Diagnosis not present

## 2023-01-21 DIAGNOSIS — R293 Abnormal posture: Secondary | ICD-10-CM | POA: Diagnosis not present

## 2023-01-21 DIAGNOSIS — R278 Other lack of coordination: Secondary | ICD-10-CM | POA: Diagnosis not present

## 2023-01-21 DIAGNOSIS — I10 Essential (primary) hypertension: Secondary | ICD-10-CM | POA: Diagnosis not present

## 2023-01-21 DIAGNOSIS — M6259 Muscle wasting and atrophy, not elsewhere classified, multiple sites: Secondary | ICD-10-CM | POA: Diagnosis not present

## 2023-01-21 DIAGNOSIS — F0150 Vascular dementia without behavioral disturbance: Secondary | ICD-10-CM | POA: Diagnosis not present

## 2023-01-31 DIAGNOSIS — F0390 Unspecified dementia without behavioral disturbance: Secondary | ICD-10-CM | POA: Diagnosis not present

## 2023-01-31 DIAGNOSIS — G4701 Insomnia due to medical condition: Secondary | ICD-10-CM | POA: Diagnosis not present

## 2023-01-31 DIAGNOSIS — F603 Borderline personality disorder: Secondary | ICD-10-CM | POA: Diagnosis not present

## 2023-01-31 DIAGNOSIS — F419 Anxiety disorder, unspecified: Secondary | ICD-10-CM | POA: Diagnosis not present

## 2023-02-01 DIAGNOSIS — M6281 Muscle weakness (generalized): Secondary | ICD-10-CM | POA: Diagnosis not present

## 2023-02-01 DIAGNOSIS — J309 Allergic rhinitis, unspecified: Secondary | ICD-10-CM | POA: Diagnosis not present

## 2023-02-04 DIAGNOSIS — S70221A Blister (nonthermal), right hip, initial encounter: Secondary | ICD-10-CM | POA: Diagnosis not present

## 2023-02-07 DIAGNOSIS — F01C18 Vascular dementia, severe, with other behavioral disturbance: Secondary | ICD-10-CM | POA: Diagnosis not present

## 2023-02-07 DIAGNOSIS — I482 Chronic atrial fibrillation, unspecified: Secondary | ICD-10-CM | POA: Diagnosis not present

## 2023-02-07 DIAGNOSIS — R251 Tremor, unspecified: Secondary | ICD-10-CM | POA: Diagnosis not present

## 2023-02-07 DIAGNOSIS — M6281 Muscle weakness (generalized): Secondary | ICD-10-CM | POA: Diagnosis not present

## 2023-02-09 ENCOUNTER — Encounter: Payer: Self-pay | Admitting: Nurse Practitioner

## 2023-02-09 ENCOUNTER — Non-Acute Institutional Stay: Payer: Medicare HMO | Admitting: Nurse Practitioner

## 2023-02-09 DIAGNOSIS — R634 Abnormal weight loss: Secondary | ICD-10-CM

## 2023-02-09 DIAGNOSIS — R630 Anorexia: Secondary | ICD-10-CM | POA: Diagnosis not present

## 2023-02-09 DIAGNOSIS — F03C11 Unspecified dementia, severe, with agitation: Secondary | ICD-10-CM | POA: Diagnosis not present

## 2023-02-09 DIAGNOSIS — Z515 Encounter for palliative care: Secondary | ICD-10-CM

## 2023-02-09 NOTE — Progress Notes (Signed)
Therapist, nutritional Palliative Care Consult Note Telephone: [phone]  Fax: [phone]    Date of encounter: 02/09/23 2:52 PM PATIENT NAME: Tara Huffman [address] [place] [address]   [phone] (home)  DOB: 07-Mar-1941 MRN: [id number] PRIMARY CARE PROVIDER:    [facility]  RESPONSIBLE PARTY:    Contact Information     Name Relation Home Work [place] Daughter [phone]  [phone]   Moore,Carolyn Sister [phone]       I met face to face with Tara Huffman in facility. Palliative Care was asked to follow this patient by consultation request of  Tara Posner, MD to address advance care planning and complex medical decision making. This is a follow up visit.                                  ASSESSMENT AND PLAN / RECOMMENDATIONS:  Symptom Management/Plan: 1. Advance Care Planning;  DNR 2. Palliative care encounter; Palliative care encounter; Palliative medicine team will continue to support patient, patient's family, and medical team. Visit consisted of counseling and education dealing with the complex and emotionally intense issues of symptom management and palliative care in the setting of serious and potentially life-threatening illness 3. Dementia/weight loss; anorexia;  progressive, reviewed weights; ongoing support, monitor weights, appetite, mood, encourage Tara Huffman to eat, supplements, when >10% may with further decline, will discuss with dtg option of Medicare benefit of hospice program see if there is an interest with revisit goc. Continue current poc.    07/16/2022 weight 100 lbs 12/17/2022 weight 91.9 lbs 01/19/2023 weight 87.8 lbs 12.2 lbs/6 months; 12.20% 4.1 lbs/4 weeks; 4.46%  Follow up Palliative Care Visit: PC f/u visit further discussion monitor trends of appetite, weights, monitor for functional, cognitive decline with chronic disease progression, assess any active symptoms, supportive role.  Palliative care will continue to follow for complex medical decision making, advance care planning, and clarification of goals. Return 4 to 8 weeks or prn.  PPS: 40% Chief Complaint: Follow up palliative consult for complex medical decision making, address goals, manage ongoing symptoms   HISTORY OF PRESENT ILLNESS:  Tara Huffman is a 82 y.o. year old female with multiple medical problems including dementia, parkinsonian features h/o hallucinations, afib, h/o myocardial infarction inferior wall, HTN, HLD, insomnia. Tara Huffman resides at [facility] for LTC, requires assistance for transfers, mobility, adl's including bathing, dressing, toileting, feeding with declined appetite per staff. Purpose of today PC f/u visit further discussion monitor trends of appetite, weights, monitor for functional, cognitive decline with chronic disease progression, assess any active symptoms, supportive role. At present Tara Huffman was sitting in the geri-chair in the day room. No meaningful discussions with cognitive impairment. Support provided. Medications, noted weight loss, goc, poc. May benefit for hospice services, will need to explore with dtg. Updated staff. Attempted to contact dtg for update on pc visit. PC f/u visit further discussion monitor trends of appetite, weights, monitor for functional, cognitive decline with chronic disease progression, assess any active symptoms, supportive role.   I spent 47 minutes providing this consultation. More than 50% of the time in this consultation was spent in counseling and care coordination.    History obtained from review of EMR, discussion with primary team, and interview with family, facility staff/caregiver and/or Tara Huffman.  I reviewed available labs, medications, imaging, studies and related documents from the EMR.  Records  reviewed and summarized above.    Physical Exam: General: frail appearing, thin cognitively impaired female, pleasant ENMT: oral  mucous membranes moist CV: RRR Pulmonary: Breath sounds clear Skin: warm and dry Neuro:  + generalized weakness,  severe cognitive impairment,  Thank you for the opportunity to participate in the care of Tara Huffman. Please call our office at [phone] if we can be of additional assistance.   Jodelle Fausto Prince Rome, NP

## 2023-02-10 DIAGNOSIS — S70221D Blister (nonthermal), right hip, subsequent encounter: Secondary | ICD-10-CM | POA: Diagnosis not present

## 2023-02-10 DIAGNOSIS — M6281 Muscle weakness (generalized): Secondary | ICD-10-CM | POA: Diagnosis not present

## 2023-02-10 DIAGNOSIS — E559 Vitamin D deficiency, unspecified: Secondary | ICD-10-CM | POA: Diagnosis not present

## 2023-02-10 DIAGNOSIS — M80062D Age-related osteoporosis with current pathological fracture, left lower leg, subsequent encounter for fracture with routine healing: Secondary | ICD-10-CM | POA: Diagnosis not present

## 2023-02-14 DIAGNOSIS — I739 Peripheral vascular disease, unspecified: Secondary | ICD-10-CM | POA: Diagnosis not present

## 2023-02-14 DIAGNOSIS — L603 Nail dystrophy: Secondary | ICD-10-CM | POA: Diagnosis not present

## 2023-02-16 DIAGNOSIS — R400 Somnolence: Secondary | ICD-10-CM | POA: Diagnosis not present

## 2023-02-16 DIAGNOSIS — I959 Hypotension, unspecified: Secondary | ICD-10-CM | POA: Diagnosis not present

## 2023-02-16 DIAGNOSIS — N390 Urinary tract infection, site not specified: Secondary | ICD-10-CM | POA: Diagnosis not present

## 2023-02-17 DIAGNOSIS — M6281 Muscle weakness (generalized): Secondary | ICD-10-CM | POA: Diagnosis not present

## 2023-02-18 DIAGNOSIS — N390 Urinary tract infection, site not specified: Secondary | ICD-10-CM | POA: Diagnosis not present

## 2023-03-02 DIAGNOSIS — G4701 Insomnia due to medical condition: Secondary | ICD-10-CM | POA: Diagnosis not present

## 2023-03-02 DIAGNOSIS — F419 Anxiety disorder, unspecified: Secondary | ICD-10-CM | POA: Diagnosis not present

## 2023-03-02 DIAGNOSIS — I2510 Atherosclerotic heart disease of native coronary artery without angina pectoris: Secondary | ICD-10-CM | POA: Diagnosis not present

## 2023-03-02 DIAGNOSIS — F01518 Vascular dementia, unspecified severity, with other behavioral disturbance: Secondary | ICD-10-CM | POA: Diagnosis not present

## 2023-03-17 DIAGNOSIS — R2681 Unsteadiness on feet: Secondary | ICD-10-CM | POA: Diagnosis not present

## 2023-03-17 DIAGNOSIS — M6259 Muscle wasting and atrophy, not elsewhere classified, multiple sites: Secondary | ICD-10-CM | POA: Diagnosis not present

## 2023-03-17 DIAGNOSIS — R278 Other lack of coordination: Secondary | ICD-10-CM | POA: Diagnosis not present

## 2023-03-17 DIAGNOSIS — R293 Abnormal posture: Secondary | ICD-10-CM | POA: Diagnosis not present

## 2023-03-18 DIAGNOSIS — R293 Abnormal posture: Secondary | ICD-10-CM | POA: Diagnosis not present

## 2023-03-18 DIAGNOSIS — R2681 Unsteadiness on feet: Secondary | ICD-10-CM | POA: Diagnosis not present

## 2023-03-18 DIAGNOSIS — R278 Other lack of coordination: Secondary | ICD-10-CM | POA: Diagnosis not present

## 2023-03-18 DIAGNOSIS — M6259 Muscle wasting and atrophy, not elsewhere classified, multiple sites: Secondary | ICD-10-CM | POA: Diagnosis not present

## 2023-03-21 DIAGNOSIS — M6281 Muscle weakness (generalized): Secondary | ICD-10-CM | POA: Diagnosis not present

## 2023-03-21 DIAGNOSIS — I10 Essential (primary) hypertension: Secondary | ICD-10-CM | POA: Diagnosis not present

## 2023-03-21 DIAGNOSIS — M6259 Muscle wasting and atrophy, not elsewhere classified, multiple sites: Secondary | ICD-10-CM | POA: Diagnosis not present

## 2023-03-21 DIAGNOSIS — I2510 Atherosclerotic heart disease of native coronary artery without angina pectoris: Secondary | ICD-10-CM | POA: Diagnosis not present

## 2023-03-21 DIAGNOSIS — R2681 Unsteadiness on feet: Secondary | ICD-10-CM | POA: Diagnosis not present

## 2023-03-21 DIAGNOSIS — R293 Abnormal posture: Secondary | ICD-10-CM | POA: Diagnosis not present

## 2023-03-21 DIAGNOSIS — R0981 Nasal congestion: Secondary | ICD-10-CM | POA: Diagnosis not present

## 2023-03-21 DIAGNOSIS — F0150 Vascular dementia without behavioral disturbance: Secondary | ICD-10-CM | POA: Diagnosis not present

## 2023-03-21 DIAGNOSIS — R278 Other lack of coordination: Secondary | ICD-10-CM | POA: Diagnosis not present

## 2023-03-22 DIAGNOSIS — M6259 Muscle wasting and atrophy, not elsewhere classified, multiple sites: Secondary | ICD-10-CM | POA: Diagnosis not present

## 2023-03-22 DIAGNOSIS — R293 Abnormal posture: Secondary | ICD-10-CM | POA: Diagnosis not present

## 2023-03-22 DIAGNOSIS — I10 Essential (primary) hypertension: Secondary | ICD-10-CM | POA: Diagnosis not present

## 2023-03-22 DIAGNOSIS — I2510 Atherosclerotic heart disease of native coronary artery without angina pectoris: Secondary | ICD-10-CM | POA: Diagnosis not present

## 2023-03-22 DIAGNOSIS — F0150 Vascular dementia without behavioral disturbance: Secondary | ICD-10-CM | POA: Diagnosis not present

## 2023-03-22 DIAGNOSIS — R0981 Nasal congestion: Secondary | ICD-10-CM | POA: Diagnosis not present

## 2023-03-22 DIAGNOSIS — R2681 Unsteadiness on feet: Secondary | ICD-10-CM | POA: Diagnosis not present

## 2023-03-22 DIAGNOSIS — M6281 Muscle weakness (generalized): Secondary | ICD-10-CM | POA: Diagnosis not present

## 2023-03-22 DIAGNOSIS — R278 Other lack of coordination: Secondary | ICD-10-CM | POA: Diagnosis not present

## 2023-03-23 DIAGNOSIS — M6281 Muscle weakness (generalized): Secondary | ICD-10-CM | POA: Diagnosis not present

## 2023-03-23 DIAGNOSIS — I2510 Atherosclerotic heart disease of native coronary artery without angina pectoris: Secondary | ICD-10-CM | POA: Diagnosis not present

## 2023-03-23 DIAGNOSIS — I10 Essential (primary) hypertension: Secondary | ICD-10-CM | POA: Diagnosis not present

## 2023-03-23 DIAGNOSIS — R293 Abnormal posture: Secondary | ICD-10-CM | POA: Diagnosis not present

## 2023-03-23 DIAGNOSIS — R2681 Unsteadiness on feet: Secondary | ICD-10-CM | POA: Diagnosis not present

## 2023-03-23 DIAGNOSIS — F0150 Vascular dementia without behavioral disturbance: Secondary | ICD-10-CM | POA: Diagnosis not present

## 2023-03-23 DIAGNOSIS — M6259 Muscle wasting and atrophy, not elsewhere classified, multiple sites: Secondary | ICD-10-CM | POA: Diagnosis not present

## 2023-03-23 DIAGNOSIS — R0981 Nasal congestion: Secondary | ICD-10-CM | POA: Diagnosis not present

## 2023-03-23 DIAGNOSIS — R278 Other lack of coordination: Secondary | ICD-10-CM | POA: Diagnosis not present

## 2023-03-24 DIAGNOSIS — R0981 Nasal congestion: Secondary | ICD-10-CM | POA: Diagnosis not present

## 2023-03-24 DIAGNOSIS — I10 Essential (primary) hypertension: Secondary | ICD-10-CM | POA: Diagnosis not present

## 2023-03-24 DIAGNOSIS — R278 Other lack of coordination: Secondary | ICD-10-CM | POA: Diagnosis not present

## 2023-03-24 DIAGNOSIS — R293 Abnormal posture: Secondary | ICD-10-CM | POA: Diagnosis not present

## 2023-03-24 DIAGNOSIS — R2681 Unsteadiness on feet: Secondary | ICD-10-CM | POA: Diagnosis not present

## 2023-03-24 DIAGNOSIS — F0150 Vascular dementia without behavioral disturbance: Secondary | ICD-10-CM | POA: Diagnosis not present

## 2023-03-24 DIAGNOSIS — M6281 Muscle weakness (generalized): Secondary | ICD-10-CM | POA: Diagnosis not present

## 2023-03-24 DIAGNOSIS — M6259 Muscle wasting and atrophy, not elsewhere classified, multiple sites: Secondary | ICD-10-CM | POA: Diagnosis not present

## 2023-03-24 DIAGNOSIS — I2510 Atherosclerotic heart disease of native coronary artery without angina pectoris: Secondary | ICD-10-CM | POA: Diagnosis not present

## 2023-03-25 DIAGNOSIS — R2681 Unsteadiness on feet: Secondary | ICD-10-CM | POA: Diagnosis not present

## 2023-03-25 DIAGNOSIS — M6281 Muscle weakness (generalized): Secondary | ICD-10-CM | POA: Diagnosis not present

## 2023-03-25 DIAGNOSIS — F0150 Vascular dementia without behavioral disturbance: Secondary | ICD-10-CM | POA: Diagnosis not present

## 2023-03-25 DIAGNOSIS — M6259 Muscle wasting and atrophy, not elsewhere classified, multiple sites: Secondary | ICD-10-CM | POA: Diagnosis not present

## 2023-03-25 DIAGNOSIS — R293 Abnormal posture: Secondary | ICD-10-CM | POA: Diagnosis not present

## 2023-03-25 DIAGNOSIS — I2510 Atherosclerotic heart disease of native coronary artery without angina pectoris: Secondary | ICD-10-CM | POA: Diagnosis not present

## 2023-03-25 DIAGNOSIS — I10 Essential (primary) hypertension: Secondary | ICD-10-CM | POA: Diagnosis not present

## 2023-03-25 DIAGNOSIS — R278 Other lack of coordination: Secondary | ICD-10-CM | POA: Diagnosis not present

## 2023-03-25 DIAGNOSIS — R0981 Nasal congestion: Secondary | ICD-10-CM | POA: Diagnosis not present

## 2023-03-28 ENCOUNTER — Non-Acute Institutional Stay: Payer: Medicare HMO | Admitting: Nurse Practitioner

## 2023-03-28 ENCOUNTER — Encounter: Payer: Self-pay | Admitting: Nurse Practitioner

## 2023-03-28 DIAGNOSIS — Z515 Encounter for palliative care: Secondary | ICD-10-CM

## 2023-03-28 DIAGNOSIS — F03C11 Unspecified dementia, severe, with agitation: Secondary | ICD-10-CM | POA: Diagnosis not present

## 2023-03-28 DIAGNOSIS — I10 Essential (primary) hypertension: Secondary | ICD-10-CM | POA: Diagnosis not present

## 2023-03-28 DIAGNOSIS — M6259 Muscle wasting and atrophy, not elsewhere classified, multiple sites: Secondary | ICD-10-CM | POA: Diagnosis not present

## 2023-03-28 DIAGNOSIS — R293 Abnormal posture: Secondary | ICD-10-CM | POA: Diagnosis not present

## 2023-03-28 DIAGNOSIS — R634 Abnormal weight loss: Secondary | ICD-10-CM

## 2023-03-28 DIAGNOSIS — R278 Other lack of coordination: Secondary | ICD-10-CM | POA: Diagnosis not present

## 2023-03-28 DIAGNOSIS — R630 Anorexia: Secondary | ICD-10-CM | POA: Diagnosis not present

## 2023-03-28 DIAGNOSIS — F0150 Vascular dementia without behavioral disturbance: Secondary | ICD-10-CM | POA: Diagnosis not present

## 2023-03-28 DIAGNOSIS — M6281 Muscle weakness (generalized): Secondary | ICD-10-CM | POA: Diagnosis not present

## 2023-03-28 DIAGNOSIS — I2510 Atherosclerotic heart disease of native coronary artery without angina pectoris: Secondary | ICD-10-CM | POA: Diagnosis not present

## 2023-03-28 DIAGNOSIS — R0981 Nasal congestion: Secondary | ICD-10-CM | POA: Diagnosis not present

## 2023-03-28 DIAGNOSIS — R2681 Unsteadiness on feet: Secondary | ICD-10-CM | POA: Diagnosis not present

## 2023-03-28 NOTE — Progress Notes (Signed)
Therapist, nutritional Palliative Care Consult Note Telephone: [phone]  Fax: [phone]    Date of encounter: 03/28/23 3:56 PM PATIENT NAME: Tara Huffman [address] [place] [address]   [phone] (home)  DOB: November 03, 1940 MRN: [id number] PRIMARY CARE PROVIDER:    [facility]  RESPONSIBLE PARTY:    Contact Information     Name Relation Home Work [place] Daughter [phone]  [phone]   Moore,Carolyn Sister [phone]          I met face to face with Tara Huffman in facility. Palliative Care was asked to follow this patient by consultation request of  Rosetta Posner, MD to address advance care planning and complex medical decision making. This is a follow up visit.                                  ASSESSMENT AND PLAN / RECOMMENDATIONS:  Symptom Management/Plan: 1. Advance Care Planning;  DNR 2. Palliative care encounter; Palliative care encounter; Palliative medicine team will continue to support patient, patient's family, and medical team. Visit consisted of counseling and education dealing with the complex and emotionally intense issues of symptom management and palliative care in the setting of serious and potentially life-threatening illness 3. Dementia/weight loss; anorexia;  progressive, reviewed weights; ongoing support, monitor weights, appetite, mood, encourage Tara Jusko to eat, supplements, when >10% may with further decline, will discuss with dtg option of Medicare benefit of hospice program see if there is an interest with revisit goc. Continue current poc.    07/16/2022 weight 100 lbs 12/17/2022 weight 91.9 lbs 01/19/2023 weight 87.8 lbs 03/21/2023 weight 89.1 lbs   Follow up Palliative Care Visit: PC f/u visit further discussion monitor trends of appetite, weights, monitor for functional, cognitive decline with chronic disease progression, assess any active symptoms, supportive role. Palliative care will  continue to follow for complex medical decision making, advance care planning, and clarification of goals. Return 2 to 8 weeks or prn.  PPS: 40% Chief Complaint: Follow up palliative consult for complex medical decision making, address goals, manage ongoing symptoms   HISTORY OF PRESENT ILLNESS:  Tara Huffman is a 82 y.o. year old female with multiple medical problems including dementia, parkinsonian features h/o hallucinations, afib, h/o myocardial infarction inferior wall, HTN, HLD, insomnia. Tara Huffman resides at [facility] for LTC, requires assistance for transfers, mobility, adl's including bathing, dressing, toileting, feeding with declined appetite per staff. Purpose of today PC f/u visit further discussion monitor trends of appetite, weights, monitor for functional, cognitive decline with chronic disease progression, assess any active symptoms, supportive role. At present Tara Huffman was sitting in the geri-chair in front of the nursing station. Tara Huffman was then wheeled to her room by staff so they can feed her. Tara Huffman does make eye contact, few words clear mostly word salad. Tara Scheele was cooperative with assessment, no meaningful discussion with cognitive impairment. Support provided, attempted to contact [employer]. Medications, goc, poc reviewed.  PC f/u visit further discussion monitor trends of appetite, weights, monitor for functional, cognitive decline with chronic disease progression, assess any active symptoms, supportive role.   I spent 45 minutes providing this consultation. More than 50% of the time in this consultation was spent in counseling and care coordination.    History obtained from review of EMR, discussion with primary team, and interview with family, facility staff/caregiver and/or Tara.  Huffman.  I reviewed available labs, medications, imaging, studies and related documents from the EMR.  Records reviewed and summarized above.    Physical  Exam: General: frail appearing, thin cognitively impaired female, pleasant ENMT: oral mucous membranes moist CV: RRR Pulmonary: Breath sounds clear Skin: warm and dry Neuro:  + generalized weakness,  severe cognitive impairment,   Thank you for the opportunity to participate in the care of Tara. Huffman. Please call our office at [phone] if we can be of additional assistance.   Lachlan Pelto Prince Rome, NP

## 2023-03-29 DIAGNOSIS — R2681 Unsteadiness on feet: Secondary | ICD-10-CM | POA: Diagnosis not present

## 2023-03-29 DIAGNOSIS — M6281 Muscle weakness (generalized): Secondary | ICD-10-CM | POA: Diagnosis not present

## 2023-03-29 DIAGNOSIS — M6259 Muscle wasting and atrophy, not elsewhere classified, multiple sites: Secondary | ICD-10-CM | POA: Diagnosis not present

## 2023-03-29 DIAGNOSIS — I10 Essential (primary) hypertension: Secondary | ICD-10-CM | POA: Diagnosis not present

## 2023-03-29 DIAGNOSIS — F0150 Vascular dementia without behavioral disturbance: Secondary | ICD-10-CM | POA: Diagnosis not present

## 2023-03-29 DIAGNOSIS — I2510 Atherosclerotic heart disease of native coronary artery without angina pectoris: Secondary | ICD-10-CM | POA: Diagnosis not present

## 2023-03-29 DIAGNOSIS — R0981 Nasal congestion: Secondary | ICD-10-CM | POA: Diagnosis not present

## 2023-03-29 DIAGNOSIS — R293 Abnormal posture: Secondary | ICD-10-CM | POA: Diagnosis not present

## 2023-03-29 DIAGNOSIS — R278 Other lack of coordination: Secondary | ICD-10-CM | POA: Diagnosis not present

## 2023-03-30 DIAGNOSIS — R293 Abnormal posture: Secondary | ICD-10-CM | POA: Diagnosis not present

## 2023-03-30 DIAGNOSIS — F0150 Vascular dementia without behavioral disturbance: Secondary | ICD-10-CM | POA: Diagnosis not present

## 2023-03-30 DIAGNOSIS — I2510 Atherosclerotic heart disease of native coronary artery without angina pectoris: Secondary | ICD-10-CM | POA: Diagnosis not present

## 2023-03-30 DIAGNOSIS — M6259 Muscle wasting and atrophy, not elsewhere classified, multiple sites: Secondary | ICD-10-CM | POA: Diagnosis not present

## 2023-03-30 DIAGNOSIS — M6281 Muscle weakness (generalized): Secondary | ICD-10-CM | POA: Diagnosis not present

## 2023-03-30 DIAGNOSIS — R278 Other lack of coordination: Secondary | ICD-10-CM | POA: Diagnosis not present

## 2023-03-30 DIAGNOSIS — R0981 Nasal congestion: Secondary | ICD-10-CM | POA: Diagnosis not present

## 2023-03-30 DIAGNOSIS — R2681 Unsteadiness on feet: Secondary | ICD-10-CM | POA: Diagnosis not present

## 2023-03-30 DIAGNOSIS — I10 Essential (primary) hypertension: Secondary | ICD-10-CM | POA: Diagnosis not present

## 2023-03-31 DIAGNOSIS — R278 Other lack of coordination: Secondary | ICD-10-CM | POA: Diagnosis not present

## 2023-03-31 DIAGNOSIS — M6281 Muscle weakness (generalized): Secondary | ICD-10-CM | POA: Diagnosis not present

## 2023-03-31 DIAGNOSIS — I10 Essential (primary) hypertension: Secondary | ICD-10-CM | POA: Diagnosis not present

## 2023-03-31 DIAGNOSIS — M6259 Muscle wasting and atrophy, not elsewhere classified, multiple sites: Secondary | ICD-10-CM | POA: Diagnosis not present

## 2023-03-31 DIAGNOSIS — F0150 Vascular dementia without behavioral disturbance: Secondary | ICD-10-CM | POA: Diagnosis not present

## 2023-03-31 DIAGNOSIS — R0981 Nasal congestion: Secondary | ICD-10-CM | POA: Diagnosis not present

## 2023-03-31 DIAGNOSIS — I2510 Atherosclerotic heart disease of native coronary artery without angina pectoris: Secondary | ICD-10-CM | POA: Diagnosis not present

## 2023-03-31 DIAGNOSIS — R293 Abnormal posture: Secondary | ICD-10-CM | POA: Diagnosis not present

## 2023-03-31 DIAGNOSIS — R2681 Unsteadiness on feet: Secondary | ICD-10-CM | POA: Diagnosis not present

## 2023-04-02 DIAGNOSIS — I2510 Atherosclerotic heart disease of native coronary artery without angina pectoris: Secondary | ICD-10-CM | POA: Diagnosis not present

## 2023-04-02 DIAGNOSIS — I10 Essential (primary) hypertension: Secondary | ICD-10-CM | POA: Diagnosis not present

## 2023-04-02 DIAGNOSIS — R0981 Nasal congestion: Secondary | ICD-10-CM | POA: Diagnosis not present

## 2023-04-02 DIAGNOSIS — R278 Other lack of coordination: Secondary | ICD-10-CM | POA: Diagnosis not present

## 2023-04-02 DIAGNOSIS — M6259 Muscle wasting and atrophy, not elsewhere classified, multiple sites: Secondary | ICD-10-CM | POA: Diagnosis not present

## 2023-04-02 DIAGNOSIS — R2681 Unsteadiness on feet: Secondary | ICD-10-CM | POA: Diagnosis not present

## 2023-04-02 DIAGNOSIS — F0150 Vascular dementia without behavioral disturbance: Secondary | ICD-10-CM | POA: Diagnosis not present

## 2023-04-02 DIAGNOSIS — M6281 Muscle weakness (generalized): Secondary | ICD-10-CM | POA: Diagnosis not present

## 2023-04-02 DIAGNOSIS — R293 Abnormal posture: Secondary | ICD-10-CM | POA: Diagnosis not present

## 2023-04-04 DIAGNOSIS — R293 Abnormal posture: Secondary | ICD-10-CM | POA: Diagnosis not present

## 2023-04-04 DIAGNOSIS — R0981 Nasal congestion: Secondary | ICD-10-CM | POA: Diagnosis not present

## 2023-04-04 DIAGNOSIS — M6281 Muscle weakness (generalized): Secondary | ICD-10-CM | POA: Diagnosis not present

## 2023-04-04 DIAGNOSIS — F0150 Vascular dementia without behavioral disturbance: Secondary | ICD-10-CM | POA: Diagnosis not present

## 2023-04-04 DIAGNOSIS — R278 Other lack of coordination: Secondary | ICD-10-CM | POA: Diagnosis not present

## 2023-04-04 DIAGNOSIS — I2510 Atherosclerotic heart disease of native coronary artery without angina pectoris: Secondary | ICD-10-CM | POA: Diagnosis not present

## 2023-04-04 DIAGNOSIS — R2681 Unsteadiness on feet: Secondary | ICD-10-CM | POA: Diagnosis not present

## 2023-04-04 DIAGNOSIS — I10 Essential (primary) hypertension: Secondary | ICD-10-CM | POA: Diagnosis not present

## 2023-04-04 DIAGNOSIS — M6259 Muscle wasting and atrophy, not elsewhere classified, multiple sites: Secondary | ICD-10-CM | POA: Diagnosis not present

## 2023-04-06 DIAGNOSIS — I2510 Atherosclerotic heart disease of native coronary artery without angina pectoris: Secondary | ICD-10-CM | POA: Diagnosis not present

## 2023-04-06 DIAGNOSIS — R293 Abnormal posture: Secondary | ICD-10-CM | POA: Diagnosis not present

## 2023-04-06 DIAGNOSIS — I10 Essential (primary) hypertension: Secondary | ICD-10-CM | POA: Diagnosis not present

## 2023-04-06 DIAGNOSIS — R278 Other lack of coordination: Secondary | ICD-10-CM | POA: Diagnosis not present

## 2023-04-06 DIAGNOSIS — F0150 Vascular dementia without behavioral disturbance: Secondary | ICD-10-CM | POA: Diagnosis not present

## 2023-04-06 DIAGNOSIS — M6259 Muscle wasting and atrophy, not elsewhere classified, multiple sites: Secondary | ICD-10-CM | POA: Diagnosis not present

## 2023-04-06 DIAGNOSIS — R0981 Nasal congestion: Secondary | ICD-10-CM | POA: Diagnosis not present

## 2023-04-06 DIAGNOSIS — R2681 Unsteadiness on feet: Secondary | ICD-10-CM | POA: Diagnosis not present

## 2023-04-06 DIAGNOSIS — M6281 Muscle weakness (generalized): Secondary | ICD-10-CM | POA: Diagnosis not present

## 2023-04-07 DIAGNOSIS — R0981 Nasal congestion: Secondary | ICD-10-CM | POA: Diagnosis not present

## 2023-04-07 DIAGNOSIS — E559 Vitamin D deficiency, unspecified: Secondary | ICD-10-CM | POA: Diagnosis not present

## 2023-04-07 DIAGNOSIS — R293 Abnormal posture: Secondary | ICD-10-CM | POA: Diagnosis not present

## 2023-04-07 DIAGNOSIS — K5900 Constipation, unspecified: Secondary | ICD-10-CM | POA: Diagnosis not present

## 2023-04-07 DIAGNOSIS — I10 Essential (primary) hypertension: Secondary | ICD-10-CM | POA: Diagnosis not present

## 2023-04-07 DIAGNOSIS — R2681 Unsteadiness on feet: Secondary | ICD-10-CM | POA: Diagnosis not present

## 2023-04-07 DIAGNOSIS — R278 Other lack of coordination: Secondary | ICD-10-CM | POA: Diagnosis not present

## 2023-04-07 DIAGNOSIS — M6281 Muscle weakness (generalized): Secondary | ICD-10-CM | POA: Diagnosis not present

## 2023-04-07 DIAGNOSIS — I2510 Atherosclerotic heart disease of native coronary artery without angina pectoris: Secondary | ICD-10-CM | POA: Diagnosis not present

## 2023-04-07 DIAGNOSIS — F0150 Vascular dementia without behavioral disturbance: Secondary | ICD-10-CM | POA: Diagnosis not present

## 2023-04-07 DIAGNOSIS — F01C18 Vascular dementia, severe, with other behavioral disturbance: Secondary | ICD-10-CM | POA: Diagnosis not present

## 2023-04-07 DIAGNOSIS — M6259 Muscle wasting and atrophy, not elsewhere classified, multiple sites: Secondary | ICD-10-CM | POA: Diagnosis not present

## 2023-04-08 DIAGNOSIS — F0150 Vascular dementia without behavioral disturbance: Secondary | ICD-10-CM | POA: Diagnosis not present

## 2023-04-08 DIAGNOSIS — R278 Other lack of coordination: Secondary | ICD-10-CM | POA: Diagnosis not present

## 2023-04-08 DIAGNOSIS — R293 Abnormal posture: Secondary | ICD-10-CM | POA: Diagnosis not present

## 2023-04-08 DIAGNOSIS — R2681 Unsteadiness on feet: Secondary | ICD-10-CM | POA: Diagnosis not present

## 2023-04-08 DIAGNOSIS — R0981 Nasal congestion: Secondary | ICD-10-CM | POA: Diagnosis not present

## 2023-04-08 DIAGNOSIS — M6259 Muscle wasting and atrophy, not elsewhere classified, multiple sites: Secondary | ICD-10-CM | POA: Diagnosis not present

## 2023-04-08 DIAGNOSIS — I2510 Atherosclerotic heart disease of native coronary artery without angina pectoris: Secondary | ICD-10-CM | POA: Diagnosis not present

## 2023-04-08 DIAGNOSIS — M6281 Muscle weakness (generalized): Secondary | ICD-10-CM | POA: Diagnosis not present

## 2023-04-08 DIAGNOSIS — I10 Essential (primary) hypertension: Secondary | ICD-10-CM | POA: Diagnosis not present

## 2023-04-11 DIAGNOSIS — I10 Essential (primary) hypertension: Secondary | ICD-10-CM | POA: Diagnosis not present

## 2023-04-11 DIAGNOSIS — R2681 Unsteadiness on feet: Secondary | ICD-10-CM | POA: Diagnosis not present

## 2023-04-11 DIAGNOSIS — R293 Abnormal posture: Secondary | ICD-10-CM | POA: Diagnosis not present

## 2023-04-11 DIAGNOSIS — M6281 Muscle weakness (generalized): Secondary | ICD-10-CM | POA: Diagnosis not present

## 2023-04-11 DIAGNOSIS — R0981 Nasal congestion: Secondary | ICD-10-CM | POA: Diagnosis not present

## 2023-04-11 DIAGNOSIS — F0150 Vascular dementia without behavioral disturbance: Secondary | ICD-10-CM | POA: Diagnosis not present

## 2023-04-11 DIAGNOSIS — R278 Other lack of coordination: Secondary | ICD-10-CM | POA: Diagnosis not present

## 2023-04-11 DIAGNOSIS — I2510 Atherosclerotic heart disease of native coronary artery without angina pectoris: Secondary | ICD-10-CM | POA: Diagnosis not present

## 2023-04-11 DIAGNOSIS — M6259 Muscle wasting and atrophy, not elsewhere classified, multiple sites: Secondary | ICD-10-CM | POA: Diagnosis not present

## 2023-04-12 DIAGNOSIS — I10 Essential (primary) hypertension: Secondary | ICD-10-CM | POA: Diagnosis not present

## 2023-04-12 DIAGNOSIS — R293 Abnormal posture: Secondary | ICD-10-CM | POA: Diagnosis not present

## 2023-04-12 DIAGNOSIS — M6281 Muscle weakness (generalized): Secondary | ICD-10-CM | POA: Diagnosis not present

## 2023-04-12 DIAGNOSIS — R2681 Unsteadiness on feet: Secondary | ICD-10-CM | POA: Diagnosis not present

## 2023-04-12 DIAGNOSIS — R278 Other lack of coordination: Secondary | ICD-10-CM | POA: Diagnosis not present

## 2023-04-12 DIAGNOSIS — M6259 Muscle wasting and atrophy, not elsewhere classified, multiple sites: Secondary | ICD-10-CM | POA: Diagnosis not present

## 2023-04-12 DIAGNOSIS — F0150 Vascular dementia without behavioral disturbance: Secondary | ICD-10-CM | POA: Diagnosis not present

## 2023-04-12 DIAGNOSIS — I2510 Atherosclerotic heart disease of native coronary artery without angina pectoris: Secondary | ICD-10-CM | POA: Diagnosis not present

## 2023-04-12 DIAGNOSIS — R0981 Nasal congestion: Secondary | ICD-10-CM | POA: Diagnosis not present

## 2023-04-13 DIAGNOSIS — R293 Abnormal posture: Secondary | ICD-10-CM | POA: Diagnosis not present

## 2023-04-13 DIAGNOSIS — R2681 Unsteadiness on feet: Secondary | ICD-10-CM | POA: Diagnosis not present

## 2023-04-13 DIAGNOSIS — F0150 Vascular dementia without behavioral disturbance: Secondary | ICD-10-CM | POA: Diagnosis not present

## 2023-04-13 DIAGNOSIS — I2510 Atherosclerotic heart disease of native coronary artery without angina pectoris: Secondary | ICD-10-CM | POA: Diagnosis not present

## 2023-04-13 DIAGNOSIS — R0981 Nasal congestion: Secondary | ICD-10-CM | POA: Diagnosis not present

## 2023-04-13 DIAGNOSIS — I10 Essential (primary) hypertension: Secondary | ICD-10-CM | POA: Diagnosis not present

## 2023-04-13 DIAGNOSIS — M6281 Muscle weakness (generalized): Secondary | ICD-10-CM | POA: Diagnosis not present

## 2023-04-13 DIAGNOSIS — R278 Other lack of coordination: Secondary | ICD-10-CM | POA: Diagnosis not present

## 2023-04-13 DIAGNOSIS — M6259 Muscle wasting and atrophy, not elsewhere classified, multiple sites: Secondary | ICD-10-CM | POA: Diagnosis not present

## 2023-06-05 ENCOUNTER — Emergency Department
Admission: EM | Admit: 2023-06-05 | Discharge: 2023-06-05 | Disposition: A | Discharge status: Home / Self Care | Payer: Medicare HMO | Attending: Emergency Medicine | Admitting: Emergency Medicine

## 2023-06-05 ENCOUNTER — Other Ambulatory Visit: Payer: Self-pay

## 2023-06-05 DIAGNOSIS — I10 Essential (primary) hypertension: Secondary | ICD-10-CM | POA: Diagnosis not present

## 2023-06-05 DIAGNOSIS — F0390 Unspecified dementia without behavioral disturbance: Secondary | ICD-10-CM | POA: Diagnosis not present

## 2023-06-05 DIAGNOSIS — R001 Bradycardia, unspecified: Secondary | ICD-10-CM

## 2023-06-05 DIAGNOSIS — R531 Weakness: Secondary | ICD-10-CM | POA: Insufficient documentation

## 2023-06-05 DIAGNOSIS — R55 Syncope and collapse: Secondary | ICD-10-CM

## 2023-06-05 LAB — CBG MONITORING, ED: Glucose-Capillary: 89 mg/dL (ref 70–99)

## 2023-06-05 NOTE — ED Triage Notes (Signed)
Arrives from [facility] via [facility]>  C/O weakness for unknown amount of time.  Per report, alert to baseline.  VS wnl 50 - P 99% RA

## 2023-06-05 NOTE — Discharge Instructions (Addendum)
You should stop taking your propranolol as this could be lowering your heart rate.  You will need to follow-up with your primary care doctor or your cardiologist, please return to the ER for reevaluation for any worsening symptoms.

## 2023-06-05 NOTE — ED Notes (Signed)
Daughter with patient. Patietn more alert. Daughter states patient has had similar episodes in the past and just needed som water to drink and food.  Daughter giving patietn water. Daughter states she would like to just return to [facility].  Alerted Dr. Larinda Buttery, who will see patient in Triage.

## 2023-06-05 NOTE — ED Triage Notes (Signed)
Pt to ED [facility] from [facility]. Daughter states she pt is normally more interactive, but today about 1 hour ago she fainted while in dayroom and came back to consciousness with sternal rub. Then she passed out again and they called EMS.   Pt appears pale. Wheelchair bound, daughter states probable dementia (no dx) that is worsening over last few months.  CBG is 89.

## 2023-06-05 NOTE — ED Notes (Signed)
Phlebotomy unable to draw blood.

## 2023-06-05 NOTE — ED Notes (Signed)
EMS agreed to take pt at this time.

## 2023-06-05 NOTE — ED Provider Notes (Signed)
[facility] Provider Note    Event Date/Time   First MD Initiated Contact with Patient 06/05/23 1714     (approximate)   History   Chief Complaint Near Syncope and Weakness   HPI  Tara Huffman is a 82 y.o. female with past medical history of hypertension, hyperlipidemia, atrial fibrillation, and dementia who presents to the ED complaining of near syncope.  Daughter reports that patient had a brief episode at her nursing facility earlier today where she was slower to respond than usual and may have passed out.  Daughter states this has happened to the patient in the past multiple times, but she always seems to wake up and return to her baseline after having some water to drink.  Patient does not recall the episode, however she currently states that she feels fine and denies any complaints.  She specifically denies any chest pain or shortness of breath.  Daughter states that patient is back to normal after she gave her some water to drink, and she would like to have her return to the nursing facility.     Physical Exam   Triage Vital Signs: ED Triage Vitals  Encounter Vitals Group     BP 06/05/23 1452 (!) 118/52     Systolic BP Percentile --      Diastolic BP Percentile --      Pulse Rate 06/05/23 1452 (!) 49     Resp 06/05/23 1452 16     Temp 06/05/23 1452 (!) 97.5 F (36.4 C)     Temp Source 06/05/23 1452 Oral     SpO2 06/05/23 1452 99 %     Weight 06/05/23 1453 115 lb (52.2 kg)     Height 06/05/23 1453 5\' 6"  (1.676 m)     Head Circumference --      [facility] Flow --      Pain Score --      Pain Loc --      Pain Education --      Exclude from Growth Chart --     Most recent vital signs: Vitals:   06/05/23 1452  BP: (!) 118/52  Pulse: (!) 49  Resp: 16  Temp: (!) 97.5 F (36.4 C)  SpO2: 99%    Constitutional: Awake and alert Eyes: Conjunctivae are normal. Head: Atraumatic. Nose: No congestion/rhinnorhea. Mouth/Throat: Mucous membranes  are moist.  Cardiovascular: Bradycardic, regular rhythm. Grossly normal heart sounds.  2+ radial pulses bilaterally. Respiratory: Normal respiratory effort.  No retractions. Lungs CTAB. Gastrointestinal: Soft and nontender. No distention. Musculoskeletal: No lower extremity tenderness nor edema.  Neurologic:  Normal speech and language. No gross focal neurologic deficits are appreciated.    ED Results / Procedures / Treatments   Labs (all labs ordered are listed, but only abnormal results are displayed) Labs Reviewed  BASIC METABOLIC PANEL  CBC  URINALYSIS, ROUTINE W REFLEX MICROSCOPIC  CBG MONITORING, ED     EKG  ED ECG REPORT I, Chesley Noon, the attending physician, personally viewed and interpreted this ECG.   Date: 06/05/2023  EKG Time: 15:15  Rate: 47  Rhythm: sinus bradycardia  Axis: Normal  Intervals:none  ST&T Change: None  PROCEDURES:  Critical Care performed: No  Procedures   MEDICATIONS ORDERED IN ED: Medications - No data to display   IMPRESSION / MDM / ASSESSMENT AND PLAN / ED COURSE  I reviewed the triage vital signs and the nursing notes.  82 y.o. female with past medical history of hypertension, hyperlipidemia, atrial fibrillation, and dementia who presents to the ED for episode of decreased responsiveness and possible syncope at her nursing facility.  Patient's presentation is most consistent with acute complicated illness / injury requiring diagnostic workup.  Differential diagnosis includes, but is not limited to, arrhythmia, symptomatic bradycardia, ACS, anemia, electrolyte abnormality, AKI.  Patient chronically ill but nontoxic-appearing and in no acute distress, vital signs remarkable for bradycardia but otherwise reassuring.  EKG shows sinus bradycardia with no ischemic changes, lab draw was attempted in triage but unsuccessful.  Daughter is now stating that patient is back to her baseline and she is  requesting that patient be discharged home.  I advised daughter that we would ideally observe patient on the cardiac monitor and draw labs, however daughter states that she has had multiple similar episodes in the past with unremarkable workup, does not desire further testing at this time.  Given patient's advanced dementia, this seems reasonable.  I do think her propranolol could be contributing to bradycardia and today's episode, daughter was informed that this medication should be stopped and this was noted on patient's discharge paperwork.  Daughter counseled to have patient return to the ED for new or worsening symptoms, patient and daughter agree with plan.      FINAL CLINICAL IMPRESSION(S) / ED DIAGNOSES   Final diagnoses:  Near syncope  Sinus bradycardia     Rx / DC Orders   ED Discharge Orders     None        Note:  This document was prepared using Dragon voice recognition software and may include unintentional dictation errors.   Chesley Noon, MD 06/05/23 [id number]

## 2023-06-05 NOTE — ED Notes (Signed)
Lab stick triage middle, called lab.

## 2023-10-19 DEATH — deceased
